# Patient Record
Sex: Female | Born: 1935 | Race: White | Hispanic: No | Smoking: Never smoker
Health system: Southern US, Community
[De-identification: ages and names within clinical notes are randomized; demographics above are authoritative.]

## PROBLEM LIST (undated history)

## (undated) DIAGNOSIS — I2699 Other pulmonary embolism without acute cor pulmonale: Secondary | ICD-10-CM

## (undated) DIAGNOSIS — I350 Nonrheumatic aortic (valve) stenosis: Secondary | ICD-10-CM

## (undated) DIAGNOSIS — I4891 Unspecified atrial fibrillation: Secondary | ICD-10-CM

## (undated) DIAGNOSIS — E7800 Pure hypercholesterolemia, unspecified: Secondary | ICD-10-CM

## (undated) DIAGNOSIS — I10 Essential (primary) hypertension: Secondary | ICD-10-CM

## (undated) DIAGNOSIS — Z85828 Personal history of other malignant neoplasm of skin: Secondary | ICD-10-CM

## (undated) DIAGNOSIS — H409 Unspecified glaucoma: Secondary | ICD-10-CM

## (undated) DIAGNOSIS — I82409 Acute embolism and thrombosis of unspecified deep veins of unspecified lower extremity: Secondary | ICD-10-CM

## (undated) HISTORY — PX: CHOLECYSTECTOMY: SHX55

## (undated) HISTORY — PX: KYPHOPLASTY: SHX5884

## (undated) HISTORY — DX: Unspecified atrial fibrillation: I48.91

## (undated) HISTORY — DX: Unspecified glaucoma: H40.9

## (undated) HISTORY — PX: CATARACT EXTRACTION: SUR2

---

## 1997-06-01 ENCOUNTER — Other Ambulatory Visit: Admission: RE | Admit: 1997-06-01 | Discharge: 1997-06-01 | Payer: Self-pay | Admitting: *Deleted

## 1997-07-01 ENCOUNTER — Ambulatory Visit (HOSPITAL_COMMUNITY): Admission: RE | Admit: 1997-07-01 | Discharge: 1997-07-01 | Payer: Self-pay | Admitting: Family Medicine

## 1998-06-10 ENCOUNTER — Ambulatory Visit (HOSPITAL_COMMUNITY): Admission: RE | Admit: 1998-06-10 | Discharge: 1998-06-10 | Payer: Self-pay | Admitting: Family Medicine

## 1998-06-10 ENCOUNTER — Encounter: Payer: Self-pay | Admitting: Family Medicine

## 1999-08-23 ENCOUNTER — Ambulatory Visit (HOSPITAL_COMMUNITY): Admission: RE | Admit: 1999-08-23 | Discharge: 1999-08-23 | Payer: Self-pay | Admitting: Family Medicine

## 1999-08-23 ENCOUNTER — Encounter: Payer: Self-pay | Admitting: Family Medicine

## 2000-08-13 ENCOUNTER — Other Ambulatory Visit: Admission: RE | Admit: 2000-08-13 | Discharge: 2000-08-13 | Payer: Self-pay | Admitting: Family Medicine

## 2000-12-16 ENCOUNTER — Encounter: Payer: Self-pay | Admitting: Family Medicine

## 2000-12-16 ENCOUNTER — Encounter: Admission: RE | Admit: 2000-12-16 | Discharge: 2000-12-16 | Payer: Self-pay | Admitting: Family Medicine

## 2001-12-17 ENCOUNTER — Ambulatory Visit (HOSPITAL_COMMUNITY): Admission: RE | Admit: 2001-12-17 | Discharge: 2001-12-17 | Payer: Self-pay | Admitting: Family Medicine

## 2001-12-17 ENCOUNTER — Encounter: Payer: Self-pay | Admitting: Family Medicine

## 2002-04-09 ENCOUNTER — Other Ambulatory Visit: Admission: RE | Admit: 2002-04-09 | Discharge: 2002-04-09 | Payer: Self-pay | Admitting: Dermatology

## 2002-06-19 ENCOUNTER — Encounter: Payer: Self-pay | Admitting: Family Medicine

## 2002-06-19 ENCOUNTER — Ambulatory Visit (HOSPITAL_COMMUNITY): Admission: RE | Admit: 2002-06-19 | Discharge: 2002-06-19 | Payer: Self-pay | Admitting: Family Medicine

## 2002-08-21 ENCOUNTER — Ambulatory Visit (HOSPITAL_COMMUNITY): Admission: RE | Admit: 2002-08-21 | Discharge: 2002-08-21 | Payer: Self-pay | Admitting: Family Medicine

## 2002-08-21 ENCOUNTER — Encounter: Payer: Self-pay | Admitting: Family Medicine

## 2002-08-29 ENCOUNTER — Emergency Department (HOSPITAL_COMMUNITY): Admission: EM | Admit: 2002-08-29 | Discharge: 2002-08-29 | Payer: Self-pay | Admitting: *Deleted

## 2002-12-21 ENCOUNTER — Ambulatory Visit (HOSPITAL_COMMUNITY): Admission: RE | Admit: 2002-12-21 | Discharge: 2002-12-21 | Payer: Self-pay | Admitting: Family Medicine

## 2003-04-15 ENCOUNTER — Ambulatory Visit (HOSPITAL_COMMUNITY): Admission: RE | Admit: 2003-04-15 | Discharge: 2003-04-15 | Payer: Self-pay | Admitting: Family Medicine

## 2003-06-14 ENCOUNTER — Other Ambulatory Visit: Admission: RE | Admit: 2003-06-14 | Discharge: 2003-06-14 | Payer: Self-pay | Admitting: Dermatology

## 2003-08-26 ENCOUNTER — Ambulatory Visit (HOSPITAL_COMMUNITY): Admission: RE | Admit: 2003-08-26 | Discharge: 2003-08-26 | Payer: Self-pay | Admitting: Otolaryngology

## 2004-05-17 ENCOUNTER — Encounter: Admission: RE | Admit: 2004-05-17 | Discharge: 2004-08-15 | Payer: Self-pay | Admitting: Family Medicine

## 2004-06-04 ENCOUNTER — Emergency Department (HOSPITAL_COMMUNITY): Admission: EM | Admit: 2004-06-04 | Discharge: 2004-06-05 | Payer: Self-pay | Admitting: Emergency Medicine

## 2004-06-08 ENCOUNTER — Ambulatory Visit: Payer: Self-pay | Admitting: Cardiology

## 2004-06-08 ENCOUNTER — Ambulatory Visit (HOSPITAL_COMMUNITY): Admission: RE | Admit: 2004-06-08 | Discharge: 2004-06-08 | Payer: Self-pay | Admitting: Family Medicine

## 2005-02-17 ENCOUNTER — Emergency Department (HOSPITAL_COMMUNITY): Admission: EM | Admit: 2005-02-17 | Discharge: 2005-02-18 | Payer: Self-pay | Admitting: Emergency Medicine

## 2005-03-12 ENCOUNTER — Ambulatory Visit: Payer: Self-pay | Admitting: Internal Medicine

## 2005-05-11 ENCOUNTER — Ambulatory Visit (HOSPITAL_COMMUNITY): Admission: RE | Admit: 2005-05-11 | Discharge: 2005-05-11 | Payer: Self-pay | Admitting: Family Medicine

## 2005-05-13 ENCOUNTER — Inpatient Hospital Stay (HOSPITAL_COMMUNITY): Admission: EM | Admit: 2005-05-13 | Discharge: 2005-05-16 | Payer: Self-pay | Admitting: Emergency Medicine

## 2005-05-15 ENCOUNTER — Encounter (INDEPENDENT_AMBULATORY_CARE_PROVIDER_SITE_OTHER): Payer: Self-pay | Admitting: *Deleted

## 2005-07-17 ENCOUNTER — Ambulatory Visit: Payer: Self-pay | Admitting: Internal Medicine

## 2005-08-02 ENCOUNTER — Ambulatory Visit (HOSPITAL_COMMUNITY): Admission: RE | Admit: 2005-08-02 | Discharge: 2005-08-02 | Payer: Self-pay | Admitting: Family Medicine

## 2005-08-02 ENCOUNTER — Encounter: Payer: Self-pay | Admitting: Vascular Surgery

## 2006-03-07 ENCOUNTER — Encounter: Admission: RE | Admit: 2006-03-07 | Discharge: 2006-04-04 | Payer: Self-pay | Admitting: Orthopaedic Surgery

## 2006-04-05 ENCOUNTER — Encounter: Admission: RE | Admit: 2006-04-05 | Discharge: 2006-04-18 | Payer: Self-pay | Admitting: Orthopaedic Surgery

## 2006-04-30 ENCOUNTER — Inpatient Hospital Stay (HOSPITAL_COMMUNITY): Admission: EM | Admit: 2006-04-30 | Discharge: 2006-05-03 | Payer: Self-pay | Admitting: Emergency Medicine

## 2006-04-30 ENCOUNTER — Encounter: Payer: Self-pay | Admitting: Vascular Surgery

## 2006-04-30 ENCOUNTER — Ambulatory Visit: Payer: Self-pay | Admitting: Vascular Surgery

## 2006-05-02 ENCOUNTER — Ambulatory Visit: Payer: Self-pay | Admitting: Cardiology

## 2006-05-02 ENCOUNTER — Encounter: Payer: Self-pay | Admitting: Cardiology

## 2006-05-10 ENCOUNTER — Ambulatory Visit: Payer: Self-pay

## 2006-05-20 ENCOUNTER — Ambulatory Visit: Payer: Self-pay | Admitting: Internal Medicine

## 2006-05-30 ENCOUNTER — Encounter (HOSPITAL_COMMUNITY): Admission: RE | Admit: 2006-05-30 | Discharge: 2006-08-19 | Payer: Self-pay | Admitting: Family Medicine

## 2006-06-04 ENCOUNTER — Emergency Department (HOSPITAL_COMMUNITY): Admission: EM | Admit: 2006-06-04 | Discharge: 2006-06-04 | Payer: Self-pay | Admitting: Emergency Medicine

## 2006-11-11 ENCOUNTER — Ambulatory Visit: Payer: Self-pay | Admitting: Internal Medicine

## 2006-11-17 ENCOUNTER — Emergency Department (HOSPITAL_COMMUNITY): Admission: EM | Admit: 2006-11-17 | Discharge: 2006-11-18 | Payer: Self-pay | Admitting: Emergency Medicine

## 2006-11-22 ENCOUNTER — Emergency Department (HOSPITAL_COMMUNITY): Admission: EM | Admit: 2006-11-22 | Discharge: 2006-11-22 | Payer: Self-pay | Admitting: Family Medicine

## 2006-12-05 ENCOUNTER — Emergency Department (HOSPITAL_COMMUNITY): Admission: EM | Admit: 2006-12-05 | Discharge: 2006-12-05 | Payer: Self-pay | Admitting: Emergency Medicine

## 2007-01-28 ENCOUNTER — Ambulatory Visit: Payer: Self-pay | Admitting: Internal Medicine

## 2007-05-30 ENCOUNTER — Ambulatory Visit (HOSPITAL_COMMUNITY): Admission: RE | Admit: 2007-05-30 | Discharge: 2007-05-30 | Payer: Self-pay | Admitting: Family Medicine

## 2007-11-03 ENCOUNTER — Ambulatory Visit: Payer: Self-pay | Admitting: Internal Medicine

## 2008-03-05 ENCOUNTER — Emergency Department (HOSPITAL_COMMUNITY): Admission: EM | Admit: 2008-03-05 | Discharge: 2008-03-06 | Payer: Self-pay | Admitting: Emergency Medicine

## 2008-07-07 ENCOUNTER — Ambulatory Visit (HOSPITAL_COMMUNITY): Admission: RE | Admit: 2008-07-07 | Discharge: 2008-07-07 | Payer: Self-pay

## 2008-08-17 ENCOUNTER — Emergency Department (HOSPITAL_COMMUNITY): Admission: EM | Admit: 2008-08-17 | Discharge: 2008-08-17 | Payer: Self-pay | Admitting: Emergency Medicine

## 2008-08-17 ENCOUNTER — Emergency Department (HOSPITAL_COMMUNITY): Admission: EM | Admit: 2008-08-17 | Discharge: 2008-08-17 | Payer: Self-pay | Admitting: Family Medicine

## 2008-08-24 ENCOUNTER — Emergency Department (HOSPITAL_COMMUNITY): Admission: EM | Admit: 2008-08-24 | Discharge: 2008-08-24 | Payer: Self-pay | Admitting: Emergency Medicine

## 2009-01-30 ENCOUNTER — Emergency Department (HOSPITAL_COMMUNITY): Admission: EM | Admit: 2009-01-30 | Discharge: 2009-01-30 | Payer: Self-pay | Admitting: Emergency Medicine

## 2009-05-10 ENCOUNTER — Emergency Department (HOSPITAL_COMMUNITY): Admission: EM | Admit: 2009-05-10 | Discharge: 2009-05-10 | Payer: Self-pay | Admitting: Emergency Medicine

## 2009-06-06 ENCOUNTER — Emergency Department (HOSPITAL_COMMUNITY): Admission: EM | Admit: 2009-06-06 | Discharge: 2009-06-07 | Payer: Self-pay | Admitting: Emergency Medicine

## 2009-11-23 ENCOUNTER — Emergency Department (HOSPITAL_COMMUNITY): Admission: EM | Admit: 2009-11-23 | Discharge: 2009-11-23 | Payer: Self-pay | Admitting: Emergency Medicine

## 2009-12-20 ENCOUNTER — Ambulatory Visit (HOSPITAL_COMMUNITY)
Admission: RE | Admit: 2009-12-20 | Discharge: 2009-12-20 | Payer: Self-pay | Source: Home / Self Care | Attending: Family Medicine | Admitting: Family Medicine

## 2009-12-22 ENCOUNTER — Ambulatory Visit (HOSPITAL_COMMUNITY)
Admission: RE | Admit: 2009-12-22 | Discharge: 2009-12-22 | Payer: Self-pay | Source: Home / Self Care | Attending: Family Medicine | Admitting: Family Medicine

## 2009-12-27 ENCOUNTER — Ambulatory Visit (HOSPITAL_COMMUNITY)
Admission: RE | Admit: 2009-12-27 | Discharge: 2009-12-27 | Payer: Self-pay | Source: Home / Self Care | Attending: Interventional Radiology | Admitting: Interventional Radiology

## 2010-01-09 ENCOUNTER — Emergency Department (HOSPITAL_COMMUNITY)
Admission: EM | Admit: 2010-01-09 | Discharge: 2010-01-09 | Payer: Self-pay | Source: Home / Self Care | Admitting: Emergency Medicine

## 2010-01-13 ENCOUNTER — Ambulatory Visit (HOSPITAL_COMMUNITY)
Admission: RE | Admit: 2010-01-13 | Discharge: 2010-01-13 | Payer: Self-pay | Source: Home / Self Care | Attending: Interventional Radiology | Admitting: Interventional Radiology

## 2010-01-18 ENCOUNTER — Ambulatory Visit (HOSPITAL_COMMUNITY)
Admission: RE | Admit: 2010-01-18 | Discharge: 2010-01-18 | Payer: Self-pay | Source: Home / Self Care | Attending: Interventional Radiology | Admitting: Interventional Radiology

## 2010-01-19 ENCOUNTER — Ambulatory Visit (HOSPITAL_COMMUNITY)
Admission: RE | Admit: 2010-01-19 | Discharge: 2010-01-19 | Payer: Self-pay | Source: Home / Self Care | Attending: Interventional Radiology | Admitting: Interventional Radiology

## 2010-01-23 LAB — CBC
HCT: 40.6 % (ref 36.0–46.0)
Hemoglobin: 13.7 g/dL (ref 12.0–15.0)
MCH: 29.6 pg (ref 26.0–34.0)
MCHC: 33.7 g/dL (ref 30.0–36.0)
MCV: 87.7 fL (ref 78.0–100.0)
Platelets: 243 10*3/uL (ref 150–400)
RBC: 4.63 MIL/uL (ref 3.87–5.11)
RDW: 14.2 % (ref 11.5–15.5)
WBC: 5.8 10*3/uL (ref 4.0–10.5)

## 2010-01-23 LAB — POCT I-STAT, CHEM 8
BUN: 19 mg/dL (ref 6–23)
Calcium, Ion: 1.18 mmol/L (ref 1.12–1.32)
Chloride: 108 mEq/L (ref 96–112)
Creatinine, Ser: 0.9 mg/dL (ref 0.4–1.2)
Glucose, Bld: 77 mg/dL (ref 70–99)
HCT: 43 % (ref 36.0–46.0)
Hemoglobin: 14.6 g/dL (ref 12.0–15.0)
Potassium: 4 mEq/L (ref 3.5–5.1)
Sodium: 141 mEq/L (ref 135–145)
TCO2: 25 mmol/L (ref 0–100)

## 2010-01-23 LAB — PROTIME-INR
INR: 1.05 (ref 0.00–1.49)
Prothrombin Time: 13.9 seconds (ref 11.6–15.2)

## 2010-01-23 LAB — APTT: aPTT: 27 seconds (ref 24–37)

## 2010-02-03 ENCOUNTER — Ambulatory Visit (HOSPITAL_COMMUNITY)
Admission: RE | Admit: 2010-02-03 | Discharge: 2010-02-03 | Payer: Self-pay | Source: Home / Self Care | Attending: Interventional Radiology | Admitting: Interventional Radiology

## 2010-03-02 ENCOUNTER — Other Ambulatory Visit (HOSPITAL_COMMUNITY): Payer: Self-pay | Admitting: Interventional Radiology

## 2010-03-02 DIAGNOSIS — M549 Dorsalgia, unspecified: Secondary | ICD-10-CM

## 2010-03-10 ENCOUNTER — Ambulatory Visit (HOSPITAL_COMMUNITY)
Admission: RE | Admit: 2010-03-10 | Discharge: 2010-03-10 | Disposition: A | Discharge status: Home / Self Care | Payer: Medicare Other | Source: Ambulatory Visit | Attending: Interventional Radiology | Admitting: Interventional Radiology

## 2010-03-10 DIAGNOSIS — M549 Dorsalgia, unspecified: Secondary | ICD-10-CM | POA: Insufficient documentation

## 2010-03-10 DIAGNOSIS — Z981 Arthrodesis status: Secondary | ICD-10-CM | POA: Insufficient documentation

## 2010-03-20 LAB — POCT I-STAT, CHEM 8
BUN: 13 mg/dL (ref 6–23)
Calcium, Ion: 1.13 mmol/L (ref 1.12–1.32)
Chloride: 103 meq/L (ref 96–112)
Creatinine, Ser: 0.9 mg/dL (ref 0.4–1.2)
Glucose, Bld: 89 mg/dL (ref 70–99)
HCT: 40 % (ref 36.0–46.0)
Hemoglobin: 13.6 g/dL (ref 12.0–15.0)
Potassium: 4.1 meq/L (ref 3.5–5.1)
Sodium: 140 meq/L (ref 135–145)
TCO2: 29 mmol/L (ref 0–100)

## 2010-03-20 LAB — CBC
HCT: 39.6 % (ref 36.0–46.0)
Hemoglobin: 13.2 g/dL (ref 12.0–15.0)
MCH: 29.2 pg (ref 26.0–34.0)
MCHC: 33.3 g/dL (ref 30.0–36.0)
MCV: 87.6 fL (ref 78.0–100.0)
Platelets: 217 K/uL (ref 150–400)
RBC: 4.52 MIL/uL (ref 3.87–5.11)
RDW: 13.4 % (ref 11.5–15.5)
WBC: 4.8 K/uL (ref 4.0–10.5)

## 2010-03-27 LAB — POCT CARDIAC MARKERS
CKMB, poc: 1.1 ng/mL (ref 1.0–8.0)
Myoglobin, poc: 66.4 ng/mL (ref 12–200)
Troponin i, poc: <0.05 ng/mL (ref 0.00–0.09)

## 2010-03-27 LAB — URINALYSIS, ROUTINE W REFLEX MICROSCOPIC
Bilirubin Urine: NEGATIVE
Ketones, ur: NEGATIVE mg/dL
Protein, ur: NEGATIVE mg/dL
Specific Gravity, Urine: 1.015 (ref 1.005–1.030)
Urobilinogen, UA: 0.2 mg/dL (ref 0.0–1.0)

## 2010-03-27 LAB — BASIC METABOLIC PANEL
BUN: 9 mg/dL (ref 6–23)
CO2: 29 mEq/L (ref 19–32)
GFR calc non Af Amer: 54 mL/min — ABNORMAL LOW (ref >60)
Glucose, Bld: 94 mg/dL (ref 70–99)
Sodium: 141 mEq/L (ref 135–145)

## 2010-03-27 LAB — CBC: Hemoglobin: 12.7 g/dL (ref 12.0–15.0)

## 2010-03-27 LAB — URINE MICROSCOPIC-ADD ON

## 2010-03-27 LAB — DIFFERENTIAL
Basophils Relative: 0 % (ref 0–1)
Lymphs Abs: 0.8 10*3/uL (ref 0.7–4.0)
Monocytes Relative: 10 % (ref 3–12)

## 2010-03-28 LAB — DIFFERENTIAL
Basophils Relative: 0 % (ref 0–1)
Lymphocytes Relative: 14 % (ref 12–46)
Monocytes Relative: 19 % — ABNORMAL HIGH (ref 3–12)
Neutro Abs: 2.6 10*3/uL (ref 1.7–7.7)
Neutrophils Relative %: 67 % (ref 43–77)

## 2010-03-28 LAB — CBC
Hemoglobin: 12.1 g/dL (ref 12.0–15.0)
RBC: 3.97 MIL/uL (ref 3.87–5.11)
RDW: 14.4 % (ref 11.5–15.5)
WBC: 3.9 10*3/uL — ABNORMAL LOW (ref 4.0–10.5)

## 2010-03-28 LAB — BASIC METABOLIC PANEL
Calcium: 8.9 mg/dL (ref 8.4–10.5)
GFR calc Af Amer: >60 mL/min (ref >60)
GFR calc non Af Amer: >60 mL/min (ref >60)
Glucose, Bld: 146 mg/dL — ABNORMAL HIGH (ref 70–99)
Potassium: 3.4 mEq/L — ABNORMAL LOW (ref 3.5–5.1)
Sodium: 139 mEq/L (ref 135–145)

## 2010-03-28 LAB — PROTIME-INR: INR: 1.21 (ref 0.00–1.49)

## 2010-04-25 LAB — CBC
HCT: 36 % (ref 36.0–46.0)
Platelets: 179 10*3/uL (ref 150–400)
RBC: 4.19 MIL/uL (ref 3.87–5.11)
WBC: 4.3 10*3/uL (ref 4.0–10.5)

## 2010-04-25 LAB — POCT I-STAT, CHEM 8
BUN: 15 mg/dL (ref 6–23)
Chloride: 106 mEq/L (ref 96–112)
Sodium: 141 mEq/L (ref 135–145)
TCO2: 23 mmol/L (ref 0–100)

## 2010-04-25 LAB — HEPATIC FUNCTION PANEL
ALT: 11 U/L (ref 0–35)
Albumin: 3.7 g/dL (ref 3.5–5.2)
Alkaline Phosphatase: 35 U/L — ABNORMAL LOW (ref 39–117)
Total Protein: 6.1 g/dL (ref 6.0–8.3)

## 2010-04-25 LAB — DIFFERENTIAL
Eosinophils Relative: 1 % (ref 0–5)
Lymphocytes Relative: 21 % (ref 12–46)
Lymphs Abs: 0.9 10*3/uL (ref 0.7–4.0)
Neutrophils Relative %: 66 % (ref 43–77)

## 2010-04-25 LAB — URINALYSIS, ROUTINE W REFLEX MICROSCOPIC
Glucose, UA: NEGATIVE mg/dL
pH: 6 (ref 5.0–8.0)

## 2010-05-02 ENCOUNTER — Emergency Department (HOSPITAL_COMMUNITY)
Admission: EM | Admit: 2010-05-02 | Discharge: 2010-05-02 | Disposition: A | Discharge status: Home / Self Care | Payer: Medicare Other | Attending: Emergency Medicine | Admitting: Emergency Medicine

## 2010-05-02 DIAGNOSIS — Z7901 Long term (current) use of anticoagulants: Secondary | ICD-10-CM | POA: Insufficient documentation

## 2010-05-02 DIAGNOSIS — M810 Age-related osteoporosis without current pathological fracture: Secondary | ICD-10-CM | POA: Insufficient documentation

## 2010-05-02 DIAGNOSIS — H409 Unspecified glaucoma: Secondary | ICD-10-CM | POA: Insufficient documentation

## 2010-05-02 DIAGNOSIS — E7800 Pure hypercholesterolemia, unspecified: Secondary | ICD-10-CM | POA: Insufficient documentation

## 2010-05-02 DIAGNOSIS — Z79899 Other long term (current) drug therapy: Secondary | ICD-10-CM | POA: Insufficient documentation

## 2010-05-02 DIAGNOSIS — Z86718 Personal history of other venous thrombosis and embolism: Secondary | ICD-10-CM | POA: Insufficient documentation

## 2010-05-02 DIAGNOSIS — I10 Essential (primary) hypertension: Secondary | ICD-10-CM | POA: Insufficient documentation

## 2010-05-02 DIAGNOSIS — M79609 Pain in unspecified limb: Secondary | ICD-10-CM | POA: Insufficient documentation

## 2010-05-02 LAB — D-DIMER, QUANTITATIVE: D-Dimer, Quant: 0.22 ug/mL-FEU (ref 0.00–0.48)

## 2010-05-02 LAB — PROTIME-INR: Prothrombin Time: 18.2 seconds — ABNORMAL HIGH (ref 11.6–15.2)

## 2010-05-23 NOTE — Assessment & Plan Note (Signed)
[facility]                            CARDIOLOGY OFFICE NOTE   NAME:Berk, ANAY WALTER                    MRN:          [id number]  DATE:11/03/2007                            DOB:          [birth year]    PRIMARY CARE PHYSICIAN:  Ernestina Penna, MD, in [place].   INTERVAL HISTORY:  Ms. Saadia Dewitt is a delightful 77 year old woman  with a history of chest pain who was subsequently found to have a  pulmonary embolus.  She has a history of mild nonsustained ventricular  tachycardia.  Initial echocardiogram showed an EF of 45-50% with some  inferobasilar hypokinesis, this was treated medically.  Outpatient  Myoview in May 2008 showed an EF of 65% with no wall motion  abnormalities or perfusion defects.  Remainder of her medical history is  notable for hypertension and intermittent snuff use.   She returns today for routine followup.  She is doing great.  She denies  any chest pain or dyspnea.  Blood pressure has been well controlled.  She has not had any neurologic symptoms.  She does have a chronic pain  in her shoulder.   REVIEW OF SYSTEMS:  Negative except as above.   CURRENT MEDICATIONS:  1. Nexium 40 a day.  2. Warfarin.  3. Vitamin D.  4. Lumigan eye drops.  5. Aspirin 81.  6. Simvastatin 40 a day.  7. Neurontin.  8. Lisinopril/hydrochlorothiazide 20/12.5 b.i.d.   PHYSICAL EXAMINATION:  GENERAL:  She is an elderly woman, in no acute  distress, ambulates around the clinic briskly without any respiratory  difficulty.  VITAL SIGNS:  Blood pressure is 132/60, heart rate 66, and  weight is 107.  HEENT:  Normal except for kyphosis.  NECK:  Supple.  No JVD.  Carotids are 2+ bilaterally without bruits.  There is no lymphadenopathy or thyromegaly.  CARDIAC:  PMI is nondisplaced.  Regular rate and rhythm.  A very soft  systolic ejection murmur at the left sternal border.  LUNGS:  Clear.  ABDOMEN:  Soft, nontender, and nondistended.  No  hepatosplenomegaly.  No  bruits, no masses.  Good bowel sounds.  EXTREMITIES:  Warm with no cyanosis, clubbing, or edema.  No rash.  NEUROLOGIC:  Alert and oriented x3.  Cranial nerves II through XII are  intact.  Moves all 4 extremities without difficulty.  Affect is  pleasant.   EKG shows sinus rhythm with PACs, question of small inferior Q-waves.  Unchanged from previous.   ASSESSMENT AND PLAN:  1. Hypertension.  Blood pressure is fairly well controlled.  This is      followed by Dr. Christell Constant.  We would continue current therapy.  2. History of abnormal echocardiogram, although her Myoview study was      negative, I do suspect that she may have some underlying coronary      artery disease, this is asymptomatic.  We will continue risk factor      management.  I do not think there is a need for cardiac      catheterization at this time.  3. History of pulmonary embolus.  She  is on Coumadin and is followed      by Dr. Christell Constant.   DISPOSITION:  We will see her back in 1 year for a yearly followup.     Bevelyn Buckles. Bensimhon, MD  Electronically Signed    DRB/MedQ  DD: 11/03/2007  DT: 11/04/2007  Job #: [id number]

## 2010-05-23 NOTE — Assessment & Plan Note (Signed)
[facility]                            CARDIOLOGY OFFICE NOTE   NAME:Fischman, TASNIA SPEGAL                    MRN:          [id number]  DATE:05/20/2006                            DOB:          19-Jan-1922    PRIMARY CARE PHYSICIAN:  Dr. Vernon Prey.   HISTORY:  Ms. Candace [name] is a delightful 77 year old woman who I recently  saw in the hospital.  She was admitted with chest pain and found to have  a DVT and a pulmonary embolus.  During the admission, she also had some  non-sustained ventricular tachycardia, so we were consulted.  Echocardiogram showed an ejection fraction of 45-50% with some  inferobasilar hypokinesis.  This was treated medically.  She was  referred for an outpatient Myoview, which showed an EF of 65%, with no  wall motion abnormalities or perfusion defects.  She returns today for  routing followup.  She says she is feeling great.  She is doing all of  her activities without any chest pain or shortness of breath.  She has  tried to decrease the amount of snuff she has been chewing.   CURRENT MEDICATIONS:  1. Nexium 40 mg a day.  2. Baby aspirin 81.  3. Warfarin and metoprolol 50 mg once a day.   PHYSICAL EXAMINATION:  GENERAL:  She is an elderly woman, no acute  distress, ambulates around the clinic briskly, without any respiratory  difficulty.  VITAL SIGNS:  Blood pressure is 138/66, heart rate 69, weight is 106.  HEENT:  Normal.  NECK:  Supple.  JVP is about 6-7 cm of water.  Carotids are 2+  bilaterally, no bruits.  There is no lymphadenopathy, thyromegaly.  CARDIAC:  Regular rate and rhythm with occasional ectopy.  A very soft,  systolic ejection murmur at the left sternal border.  LUNGS:  Clear.  ABDOMEN:  Soft, nontender, nondistended.  No hepatosplenomegaly, no  bruits, no masses.  Good bowel sounds.  EXTREMITIES:  Warm with no cyanosis, clubbing or edema.  Distal pulses  are 2+ bilaterally.  SKIN:  There is no rash.  NEUROLOGIC:  She has a very pleasant affect.  Cranial nerves 2-12 are  grossly intact.  Moves all four extremities without difficulty.  Strength is normal.  She is alert and oriented x3.   EKG shows a sinus rhythm with occasional PACs.  Heart rate is 69.  There  is minimal non-specific ST/T wave abnormalities.   ASSESSMENT/PLAN:  1. Chest pain and abnormal echocardiogram.  She did have some evidence      of possible inferior wall hypokinesis on her inpatient      echocardiogram; however, her Myoview looks good with a normal EF      and no perfusion defects.  I suspect she does have some mild      coronary artery disease, but this does not appear high-grade at      this point.  We will just continue our medical therapy.  I would be      quite aggressive with her blood pressure and cholesterol regimen.  2. Hypertension.  We will switch her  metoprolol over to Coreg 6.25      b.i.d. and she will follow with Dr. Christell Constant.  Goal blood pressure      will be less than 130.  3. Hyperlipidemia.  Given her possible coronary artery disease, I      would be quite aggressive with treating her lipids with Statin      therapy.  Suggest an LDL of at least under 100, if not under 70.      She will follow up with Dr. Christell Constant for this.  4. Non-sustained ventricular tachycardia.  This is apparently      resolved.  __________  have just minimal QT prolongation, but does      not appear to be at high risk for sudden cardiac death.  5. Disposition:  Will see her back in the clinic in 6 months for      routine followup.     Bevelyn Buckles. Bensimhon, MD     DRB/MedQ  DD: 05/20/2006  DT: 05/20/2006  Job #: [id number]   cc:   Ernestina Penna, M.D.

## 2010-05-23 NOTE — Assessment & Plan Note (Signed)
[facility]                            CARDIOLOGY OFFICE NOTE   NAME:[name], [name]                    MRN:          [id number]  DATE:01/28/2007                            DOB:          28-Apr-1922    PRIMARY CARE PHYSICIAN:  Dr. Vernon Prey in [place].   INTERVAL HISTORY:  Candace [name] is a delightful 77 year old woman with  history of chest pain who was subsequently found to have a DVT and  pulmonary embolus during the previous admission.  She had some mild  nonsustained ventricular tachycardia.  Echocardiogram showed an EF of 45-  50% with some inferior basilar hypokinesis.  This was treated medically.  She underwent outpatient Myoview back in May 2008 which showed an EF of  65% with no wall motion abnormalities or perfusion defects.  Remainder  of her medical history is notable for hypertension and previous snuff  use.   She returns today for routine follow-up.  She has been doing fairly  well.  She continues to have some chronic shoulder and neck pain which  is unchanged.  This is nonexertional.  She has had extensive orthopedic  workup, and sounds like it is been negative.  She denies any dyspnea.  No lower extremity edema.  She is been taking her blood pressure  medications routinely.   CURRENT MEDICATIONS:  Nexium 40 a day, warfarin, vitamin D, aspirin 81,  lisinopril HCTZ 20/12.5, simvastatin and Neurontin.   PHYSICAL EXAM:  She is no acute distress.  Ambulates around the clinic  without respiratory difficulty.  Blood pressure is 138/78, heart rate is 64.  HEENT is normal.  Neck is supple.  No JVD.  Carotids 2+ bilaterally without bruits.  There  is no lymphadenopathy or thyromegaly.  CARDIAC:  PMI is not displaced.  She is regular rate and rhythm with a  very soft systolic ejection murmur left sternal border.  LUNGS:  Clear.  ABDOMEN:  Soft, nontender, nondistended.  No hepatosplenomegaly, no  bruits, no masses.  Good bowel sounds.  EXTREMITIES:  Warm with cyanosis, clubbing or edema.  Distal pulses are  2+ bilaterally.  There is no rash.  NEURO:  She is alert and oriented x3.  Cranial nerves II-XII grossly  intact.  Moves all four extremities without difficulty.  Affect is  pleasant.   ASSESSMENT/PLAN:  1. Hypertension, blood pressure is still mildly elevated.  Will      increase her lisinopril/hydrochlorothiazide to 40/25.  She will      check a BMET next week in Dr. Kathi Der office.  If her blood      pressure remains elevated, could consider adding Norvasc.  2. Shoulder and neck pain.  The characteristics of this are very      atypical for angina.  She had Myoview which was negative.  I do not      think this is cardiac related.  3. Hyperlipidemia is followed by Dr. Christell Constant.  Continue statin.   DISPOSITION:  Will see her back for routine follow-up in about 9 months.     Bevelyn Buckles. Bensimhon, MD  Electronically Signed  DRB/MedQ  DD: 01/28/2007  DT: 01/28/2007  Job #: [id number]   cc:   Ernestina Penna, M.D.

## 2010-05-23 NOTE — Assessment & Plan Note (Signed)
[facility]                            CARDIOLOGY OFFICE NOTE   NAME:Bufano, SAMARI BITTINGER                    MRN:          [id number]  DATE:11/11/2006                            DOB:          Feb 27, 1922    INTERVAL HISTORY:  Ms. Candace [name] is a delightful, 77 year old woman with  a history of chest pain who was subsequently found to have a DVT and  pulmonary embolus.  During that admission, she had some mild,  nonsustained ventricular tachycardia.  Echocardiogram showed an EF of 45-  50% with some inferobasilar hypokinesis.  This was treated medically.  She underwent an outpatient Myoview which showed an EF of 65% with no  wall motion abnormalities or perfusion defects and says she has been  treated medically.  She also has a history of hypertension.   She is doing well.  She does complain of some chronic shoulder and neck  pain.  This is not exertional and can happen at any time.  She thinks it  is arthritis.  Otherwise, she is not having any problems.  She denies  any shortness of breath, no lower extremity edema, no orthopnea or PND.   CURRENT MEDICATIONS:  1. Nexium 40 a day.  2. Coreg 6.25 mg b.i.d.  3. Lumigan eye drops.   PHYSICAL EXAMINATION:  GENERAL:  She is an elderly woman in no acute  distress.  Ambulates around the clinic briskly without any respiratory  difficulty.  VITAL SIGNS:  Blood pressure initially 160/80, on recheck 180/80.  HEENT:  Normal.  NECK:  Supple.  There is no JVD.  Carotids are 2+ bilaterally without  bruits.  There is no lymphadenopathy or thyromegaly.  CARDIAC:  Nondisplaced.  Regular rate and rhythm with a S4, no murmurs.  LUNGS:  Clear.  ABDOMEN:  Soft, nontender, nondistended.  No hepatosplenomegaly, no  bruits, no masses, good bowel sounds.  There are no renal bruits  appreciated.  EXTREMITIES:  Warm with no clubbing, cyanosis or edema.  Distal pulses  are 2+ bilaterally.  There is no rash.  NEUROLOGIC:  Very  pleasant affect.  Cranial nerves 2-12 grossly intact.  Moves all four extremities without difficulty.  She is alert and  oriented x3.   EKG shows normal sinus rhythm at a rate of 71.  Minimal amount of  specific ST segment abnormality in I and aVL.   ASSESSMENT/PLAN:  1. Abnormal echocardiogram.  Her Myoview is quite reassuring.  She      will continue medical therapy.  There is no evidence of ischemia.  2. Hypertension.  Blood pressure is significantly elevated.  She is      not having any neurologic symptoms.  We will start her on      lisinopril 20/HCTZ 12.5 and will check a potassium in 1 week.  She      will follow up with Dr. Christell Constant at the end of the month and then come      back and see [place] in 2 months.  If her blood pressure is difficult to      control, can consider renal ultrasound to  rule out renal artery      stenosis.     Bevelyn Buckles. Bensimhon, MD  Electronically Signed    DRB/MedQ  DD: 11/11/2006  DT: 11/12/2006  Job #: [id number]   cc:   Ernestina Penna, M.D.  Birdena Jubilee, NP

## 2010-05-26 NOTE — H&P (Signed)
NAME:  Candace [name], Candace [name]             ACCOUNT NO.:  [id number]   MEDICAL RECORD NO.:  [id number]          PATIENT TYPE:  INP   LOCATION:  A313                          FACILITY:  [facility]   PHYSICIAN:  Dirk Dress. Katrinka Blazing, M.D.   DATE OF BIRTH:  07-01-1922   DATE OF ADMISSION:  05/13/2005  DATE OF DISCHARGE:  LH                                HISTORY & PHYSICAL   HISTORY OF THE PRESENT ILLNESS:  This is an 77 year old female who presents  with a four to five-hour history of severe abdominal pain with nausea, but  no vomiting.  The patient states that the pain was acute in onset.  It is  located in the right upper abdomen and radiates through to her back.  She  has nausea, but no vomiting as noted.  She has had similar symptoms before.  She states she had similar symptoms for about eight weeks.  She has been  evaluated and she has a known history of gallstones.  She appears to be  having symptoms of biliary colic.  The patient was seen in the emergency  room and is admitted for treatment.   PAST MEDICAL HISTORY:  The patient has osteoarthritis and glaucoma. This is  her first hospitalization.   PAST SURGICAL HISTORY:  The patient has had no surgery.   REVIEW OF SYSTEMS:  There is no known history of heart or lung disease.   The patient had an echocardiogram done in June 2006.  This was a normal  study with normal right ventricular size and function, and normal regional  wall motion and global left ventricular systolic function.  There was mild  left ventricular hypertrophy.   MEDICATIONS:  1.  Nexium 40 mg daily.  2.  Aspirin 81 mg daily.   ALLERGIES:  THE PATIENT HAS ALLERGIES TO CODEINE, PENICILLIN, SULFA AND  ERYTHROMYCIN.   PHYSICAL EXAMINATION:  GENERAL APPEARANCE:  On exam the patient appears to  be in no acute distress.  VITAL SIGNS: Blood pressure is 146/70, pulse 60, respirations 12 and  temperature 97.7.  Weight 111 pounds.  She is a very small-framed female.  HEENT:  The  head, eyes, ears, nose and throat are unremarkable.  NECK:  The neck is supple with no JVD, bruit, adenopathy, or thyromegaly.  CHEST:  The chest is clear to auscultation.  HEART:  The heart has a regular rate and rhythm without murmur, gallop or  rub.  ABDOMEN:  The abdomen is mildly  distended with mild epigastric and right  upper quadrant tenderness.  Normal active bowel sounds.  No lower abdominal  tenderness. No masses.  EXTREMITIES:  No cyanosis, clubbing or edema.  NEUROLOGIC EXAMINATION:  No focal motor sensory or cerebellar deficit.   ANCILLARY DATA:  Ultrasound of the abdomen, which was done on May 11, 2005  showed multiple small gallstones on the gallbladder, normal common bile  duct, and no free peritoneal fluid.  There was a suggestion of mild right  hydronephrosis.   IMPRESSION:  Cholelithiasis with cholecystitis.   PLAN:  1.  The patient is being admitted.  2.  The  patient will be treated symptomatically.  3.  The patient is being IV antibiotics.  4.  We will schedule a cholecystectomy n May 15, 2005.      Dirk Dress. Katrinka Blazing, M.D.  Electronically Signed     LCS/MEDQ  D:  05/14/2005  T:  05/15/2005  Job:  [id number]

## 2010-05-26 NOTE — Discharge Summary (Signed)
NAME:  RAEGAN, Candace [name]             ACCOUNT NO.:  [id number]   MEDICAL RECORD NO.:  [id number]          PATIENT TYPE:  INP   LOCATION:  A313                          FACILITY:  [facility]   PHYSICIAN:  Dirk Dress. Katrinka Blazing, M.D.   DATE OF BIRTH:  [birth year]   DATE OF ADMISSION:  05/13/2005  DATE OF DISCHARGE:  05/09/2007LH                                 DISCHARGE SUMMARY   DISCHARGE DIAGNOSES:  1.  Cholelithiasis with cholecystitis.  2.  Osteoarthritis.   SPECIAL PROCEDURES:  Laparoscopic cholecystectomy on May 8.   DISPOSITION:  The patient discharged home in stable, satisfactory condition.   DISCHARGE MEDICATIONS:  Tylenol 325 mg 2 tablets every 4 hours as needed for  pain.   FOLLOWUP:  The patient is scheduled to be seen in the office in 2 weeks.   HOSPITAL COURSE:  An 77 year old female with a 5-hour history of severe  abdominal pain with nausea but no vomiting.  The pain was acute in onset and  was in the right upper abdomen and radiated through to her back.  She had  nausea without vomiting.  She had had similar symptoms off and on for about  8 weeks.  She has a known history of gallstones.  She was felt to be having  biliary colic.  She was seen in the emergency room and was admitted.  Her  only other medical problem was osteoarthritis.  She had had an  echocardiogram done in June 2006 which was normal.  Examination revealed a  mildly distended abdomen with epigastric and right upper quadrant  tenderness.  The patient was admitted, started on IV fluids, IV antibiotics  and analgesics.  She felt better the day after admission. She was afebrile.  Abdominal pain was improved.  She was scheduled for cholecystectomy and this  was carried out on May 8 uneventfully.  She had no postoperative problems,  did exceptionally well, and was discharged home on the morning of May 9 in  satisfactory condition without pain, nausea, vomiting, diarrhea with her  wounds healing nicely and was tolerating  a diet.  All of her preoperative  symptoms had resolved.      Dirk Dress. Katrinka Blazing, M.D.  Electronically Signed     LCS/MEDQ  D:  07/08/2005  T:  07/09/2005  Job:  [id number]

## 2010-05-26 NOTE — Discharge Summary (Signed)
NAME:  Candace [name], Candace [name]             ACCOUNT NO.:  [id number]   MEDICAL RECORD NO.:  [id number]          PATIENT TYPE:  INP   LOCATION:  6707                         FACILITY:  [facility]   PHYSICIAN:  Ellie Lunch, M.D.      DATE OF BIRTH:  [birth year]   DATE OF ADMISSION:  04/29/2006  DATE OF DISCHARGE:  05/03/2006                               DISCHARGE SUMMARY   PRIMARY CARE PHYSICIAN:  Dr. Rudi Heap.   DISCHARGE DIAGNOSIS:  1. Left lower extremity deep vein thrombosis and primary embolism.  2. New diagnosis of dyslipidemia with an LDL of 145.  3. Subclinical hypothyroidism with a TSH of 7 and a repeat TSH of 5.1.  4. Nonsustained ventricular tachycardia.  Patient is scheduled for a      Cardiolite on May 10, 2006.  5. Osteoporosis.   DISCHARGE MEDICATIONS:  1. Lovenox shots 50 mg every 12 hours for the next 3 days.  2. Coumadin 7.5 mg p.o. daily.  3. Toprol XL 50 mg p.o. daily.  4. Zocor 40 mg p.o. daily.  5. Aspirin 80 mg p.o. daily.  6. Nexium 40 mg p.o. daily.  7. Os-cal plus D p.o. daily.   DISPOSITION AND FOLLOWUP:  Patient to followup with Laure Kidney on  May 06, 2006 at 9 a.m.  Please check patient's PT/INR on that day to  make sure Coumadin is therapeutic.  Also note patient has been started  on Zocor and thus we will need a lipid panel in about 6 weeks.  Note her  LDL was 145.  Also patient has been given an application for [facility]  Short Stay to get IV Reclast for her osteoporosis and that can be filled  up at the hospital followup visit as well.  Patient will follow up with  Dr. Gala Romney at [facility] at 10:30 a.m. on May 12.  She will  also have an adenosine Cardiolite stress test on the same day at 7:45  a.m.   PROCEDURE DONE:  1. A 2-D echocardiogram was performed on May 02, 2006 to evaluate      nonsustaining ventricular tachycardia which showed diastolic      dysfunction.  Overall systolic function was mildly decreased and EF      was  estimated at 45-50% with left ventricular thickness was mildly      increased as well.  Also there was a small pericardial effusion      posterior to the heart and there was significant hypokinesis of the      inferior wall and the inferior septum.  2. A CT of the chest performed on April 09, 2006 showed a right      pulmonary embolus appearing below a 10 raising the question of fat      embolus as well.   ADMISSION HISTORY AND PHYSICAL:  Candace [name] is a very pleasant 77-  year-old woman with an insignificant past medical history that was  admitted on April 30, 2006 because of a left upper chest pain that was  dull, achy, 8 out of 10 in severity at it worse and was  nonradiating and  nonpleuritic.  She had a CT scan done which showed a right sided  pulmonary embolism and thus was admitted.   HOSPITAL COURSE:  1. Right lower extremity DVT as well as pulmonary embolism.  It is      unclear why patient developed this at her age.  She does not show      any evidence of malignancy.  She has had a colonoscopy.  She      stopped having mammograms but was recommended to probably have one.      Given this new diagnosis of pulmonary embolism she was put on      Lovenox and Coumadin.  Her INR was 1.5 on day of discharge, that      was April 25.  She will continue to get Lovenox over the weekend      and will see her primary care physician on April 28 to assess the      needs for continuation of Lovenox is Coumadin does not become      Therapeutic.  Patient's daughter is going to give Lovenox shots to      her.  2. Nonsustained ventricular tachycardia.  Patient had multiple      episodes of nonsustained ventricular tachycardia.  A 2-D      echocardiogram was obtained with findings as above.  All of her      electrolytes were normal.  Cardiac enzymes were normal.  A      cardiology consult was obtained and Dr. Gala Romney evaluated      patient.  He placed the patient on Toprol XL and will due an       outpatient Cardiolite on May 10, 2006.  3. Dyslipidemia.  A fasting lipid panel was obtained with a total      cholesterol of 202 and LDL of 145.  Patient was started on Zocor 40      p.o. daily and this was again is suppose to be titrated as an      outpatient.  4. Osteoporosis.  An effort was made to give patient IV reclasp while      she was in the hospital.  However, because of reimbursement issues      this is not possible and patient has to get it through short stay.      She has been given an application packet which can be filled out in      the hospital followup visit so that she can get her reclasp while      she comes to get her Cardiolite on May 10, 2006.  5. Subclinical hypothyroidism.  Note patient's TSH was 7.16.  A repeat      TSH was 5.1 and free T4 was normal.  This can further be assessed      as an outpatient if patient were to have symptoms of      hypothyroidism.  6. All other hospital conditions were stable and managed on home meds.      For further details please refer to the discharge summary.      Ellie Lunch, M.D.  Electronically Signed     BP/MEDQ  D:  05/03/2006  T:  05/03/2006  Job:  [id number]   cc:   Ernestina Penna, M.D.  Bevelyn Buckles. Bensimhon, MD  Laure Kidney, NP

## 2010-05-26 NOTE — Procedures (Signed)
NAME:  TIDA, SANER NO.:  [id number]   MEDICAL RECORD NO.:  [id number]          PATIENT TYPE:  OUT   LOCATION:  RAD                           FACILITY:  [facility]   PHYSICIAN:  Wellton Bing, M.D.  DATE OF BIRTH:  [birth year]   DATE OF PROCEDURE:  06/08/2004  DATE OF DISCHARGE:                                  ECHOCARDIOGRAM   REFERRING:  Ernestina Penna, M.D.   CLINICAL DATA:  A 77 year old gentleman with LVH.  M-mode aorta 3.3, left  atrium 4.2, septum 1.4, posterior wall 1.1, LV diastole 3.8, LV systole 2.5.   1.  Technically adequate echocardiographic study.  2.  Mild left atrial enlargement.  Right atrial size of the upper limit of      normal.  Normal right ventricular size and function; mild RVH.  3.  Normal mitral valve; mild annular calcification; minimal regurgitation.  4.  Mild aortic valvular sclerosis; very mild calcification of the proximal      ascending aorta.  5.  Normal tricuspid valve; very mild regurgitation; normal estimated RV      systolic pressure.  6.  Normal pulmonic valve and proximal pulmonary artery.  7.  Normal left ventricular size; mild hypertrophy with disproportionate      involvement of the septum.  Normal regional and global LV systolic function.  1.  Normal IVC.       RR/MEDQ  D:  06/08/2004  T:  06/09/2004  Job:  [id number]

## 2010-05-26 NOTE — Op Note (Signed)
NAME:  MERRELL, RETTINGER             ACCOUNT NO.:  [id number]   MEDICAL RECORD NO.:  [id number]          PATIENT TYPE:  INP   LOCATION:  A313                          FACILITY:  [facility]   PHYSICIAN:  Dirk Dress. Katrinka Blazing, M.D.   DATE OF BIRTH:  02-27-1922   DATE OF PROCEDURE:  05/15/2005  DATE OF DISCHARGE:                                 OPERATIVE REPORT   PREOPERATIVE DIAGNOSIS:  Cholelithiasis, cholecystitis.   POSTOPERATIVE DIAGNOSIS:  Cholelithiasis, cholecystitis.   PROCEDURE:  Laparoscopic cholecystectomy.   SURGEON:  Dirk Dress. Katrinka Blazing, M.D.   DESCRIPTION:  Under general anesthesia, the patient's abdomen was prepped  and draped in a sterile field.  Supraumbilical incision was made.  Veress  needle was inserted uneventfully without difficulty.  Abdomen was  insufflated with 2.5 L of CO2.  Using a Visiport guide, 10-mm port was  placed.  Laparoscope was placed.  Gallbladder was visualized.  Patient  placed in reverse Trendelenburg position.  Under videoscopic guidance, a 10-  mm and two 5-mm ports were placed in the right subcostal region.  Gallbladder was grasped in position.  Cystic duct was dissected, clipped  with five clips in the body.  There were two bites to the cystic artery.  Each was clipped with three clips and divided.  Gallbladder was then  separated from the intrahepatic space using electrocautery.  Gallbladder was  placed in an EndoCatch device and retrieved.  There was no bleeding from the  bed.  There was no evidence of bile leak.  Irrigation was carried out until  fluids returned clear.  The patient tolerated the procedure well.  CO2 was  allowed to escape from the abdomen.  The ports were removed.  The incision  at the umbilicus was closed with 0 Vicryl in the fascia.  All the skin  incisions were closed with staples.  The patient tolerated the procedure  well.  She was awakened from anesthesia uneventfully, transferred to her  bed, and taken to the post-anesthetic care  unit in satisfactory condition.      Dirk Dress. Katrinka Blazing, M.D.  Electronically Signed     LCS/MEDQ  D:  05/15/2005  T:  05/16/2005  Job:  [id number]   cc:   Ernestina Penna, M.D.  Fax: [phone]

## 2010-05-26 NOTE — Consult Note (Signed)
NAME:  Candace [name], Candace [name]             ACCOUNT NO.:  [id number]   MEDICAL RECORD NO.:  [id number]          PATIENT TYPE:  INP   LOCATION:  6707                         FACILITY:  [facility]   PHYSICIAN:  Bevelyn Buckles. Bensimhon, MDDATE OF BIRTH:  March 21, 1922   DATE OF CONSULTATION:  DATE OF DISCHARGE:                                 CONSULTATION   PRIMARY CARE PHYSICIAN:  Ernestina Penna, M.D.   REASON FOR CONSULTATION:  Nonsustained ventricular tachycardia.   HISTORY OF PRESENT ILLNESS:  Candace [name] is a delightful 77 year old  woman with no known history of cardiac disease.  She does have a history  of hypertension, osteoporosis, and skin cancer as well as ongoing snuff  use.  She was admitted on April 29, 2006 with complaint of chest pain.  CAT scan showed a right-sided pulmonary embolus.  She has since been  started on Lovenox and Coumadin.  She has denied any further chest pain.  She does not have any shortness of breath.   While in the hospital, she has been noted to have two to three runs of  brief asymptomatic and nonsustained VT up to 16 beats.  Also on her EKG,  she was noted to have mild QT prolongation.  She denies any history of  syncope or presyncope.  There is no family history of sudden cardiac  death.   The remainder of her review of systems is negative, except for HPI and  problem list.   PROBLEM LIST:  1. Hypertension.  2. Osteoporosis.  3. Glaucoma.  4. Skin cancer, status post resection.  5. Gallstones, status post cholecystectomy.   CURRENT MEDICATIONS:  1. Lovenox and Coumadin.  2. Aspirin 81 a day.  3. Zocor 10 a day.  4. Metoprolol 12.5 b.i.d.   ALLERGIES:  PENICILLIN, SULFA, AND CODEINE.   SOCIAL HISTORY:  She is widowed.  She is very active.  She denies any  history of alcohol or drug use.  There is no cigarettes smoking.  She  does chew snuff which is ongoing.   FAMILY HISTORY:  There is no family history of sudden cardiac death or  premature  coronary artery disease.   PHYSICAL EXAMINATION:  GENERAL:  She is a spry 77 year old sitting up in  bed in no acute distress.  VITAL SIGNS:  Respirations are unlabored.  Blood pressure is 132/69,  heart rate 74.  She is afebrile.  Saturations 98% on room air.  HEENT:  Normal.  NECK:  Supple.  There is no JVD.  Carotids are 2+ bilateral without  bruits.  There is no lymphadenopathy or thyromegaly.  CARDIAC:  She has a regular rate and rhythm.  No murmurs, rubs, or  gallops.  LUNGS:  Clear.  ABDOMEN:  Soft, nontender, nondistended.  There is no  hepatosplenomegaly, no bruits, no masses.  Good bowel sounds.  EXTREMITIES:  Warm with no clubbing, cyanosis, or edema.  There are no  cords, no rashes.  NEUROLOGIC:  Alert and oriented x3.  Cranial nerves II-XII are intact.  She moves all four extremities without difficulty.   Telemetry shows normal sinus rhythm.  Currently  there is one five-beat  run of nonsustained VT earlier in the day which was asymptomatic.   EKG from April 29, 2006 shows normal sinus rhythm at a rate of 79.  There are no acute ST-T wave abnormalities.  Minimally prolonged QT  interval at 438 milliseconds with a QTc interval of 502 milliseconds.  QRS duration is normal at 83 milliseconds.   LABORATORY DATA:  White count 17.6, hemoglobin 13.6, platelets 280.  Sodium 135, potassium 4.4, BUN 13, creatinine 0.78, glucose 99.  Troponins were 0.01 and 0.02.  Total cholesterol 214, HDL 52, LDL 145,  and triglycerides 87.   EKG, as read by Dr. Myrtis Ser, shows an left ventricular ejection fraction of  approximately 45-50% if not slightly better.  There was hyperkinesis of  the inferior and posterior wall and mild LVH.  No significant valvular  disease.  The right side was not well seen but appeared normal.   ASSESSMENT AND PLAN:  1. Asymptomatic and nonsustained ventricular tachycardia.  2. Mildly depressed ejection fraction with inferior and posterior wall      hypokinesis.   3. Acute pulmonary embolus.  4. Minimal QT prolongation.   At this point I think her risk for sudden cardiac death and life  threatening arrhythmias is quite low.  I have discussed this with Dr.  Lewayne Bunting who agrees.  I think she is safe for discharge in the  morning.  We would recommend Toprol-XL 50 mg a day as well as an  outpatient adenosine Myoview which is scheduled for Friday, May 10, 2006  at 8 a.m.  We will also increase her Zocor to 40 mg a day in an effort  to get her LDL down to 70.   We appreciate the consult.  Please do not hesitate to call with  questions.      Bevelyn Buckles. Bensimhon, MD  Electronically Signed     DRB/MEDQ  D:  05/02/2006  T:  05/02/2006  Job:  [id number]

## 2010-05-26 NOTE — H&P (Signed)
NAME:  Candace [name], Candace [name] NO.:  [id number]   MEDICAL RECORD NO.:  [id number]          PATIENT TYPE:  EMS   LOCATION:  MAJO                         FACILITY:  [facility]   PHYSICIAN:  Marcellus Scott, MD     DATE OF BIRTH:  Oct 16, 1922   DATE OF ADMISSION:  04/29/2006  DATE OF DISCHARGE:                              HISTORY & PHYSICAL   PRIMARY CARE PHYSICIAN:  Dr. Rudi Heap of [facility].   CHIEF COMPLAINT:  Chest pain.   HISTORY OF PRESENT ILLNESS:  Candace [name] is a pleasant 77 year old  Caucasian female patient with past medical history of hypertension,  osteoporosis, glaucoma, skin cancer status post excision.  She was in  her usual state of health until yesterday morning when she experienced  left upper chest pain which was dull, aching, 8/10 in severity at its  worst.  It was not radiating, not pleuritic with no associated dyspnea  or diaphoresis.  The patient said the pain gradually subsided and she  did not have any further pain until last night.  She did not seek any  medical attention immediately.  However, this morning, she again  experienced a similar kind of pain following which she was seen by her  primary care doctor.  An EKG was done and the patient was sent to the  emergency room for further evaluation and management.   On further evaluation in the emergency room, a CAT scan of the chest  with contrast has revealed a right-sided pulmonary embolism.  The  patient currently is asymptomatic of chest pain, dyspnea, palpitations.   PAST MEDICAL HISTORY:  1. Questionable hypertension.  2. Osteoporosis.  3. Glaucoma.  4. History of skin cancer on the left cheek which was excised.   PAST SURGICAL HISTORY:  1. Status post cholecystectomy.  2. Status post right intraocular lens placement.   ALLERGIES:  1. PENICILLIN.  2. SULFA.  3. CODEINE.   MEDICATIONS:  1. Nexium 1 tablet p.o. daily.  2. Aspirin 81 mg p.o. daily.  3.  Vitamin D plus calcium.   FAMILY HISTORY:  The patient's sister also with a history of  osteoporosis.   SOCIAL HISTORY:  The patient is widowed.  She is independent of  activities of daily living.  She denies any history of alcohol, drug  abuse or smoking. Abuses snuff.   ADVANCE DIRECTIVES:  The patient is a full code.   REVIEW OF SYSTEMS:  Over 10 systems reviewed  1. The patient with history of recent dental work and some history of      lower lip swelling which is intermittent, persisting and has      difficulty to chew because of that, but no pain.  2. No history of recent travel.   PHYSICAL EXAMINATION:  Candace [name] is a moderately built and nourished  female.  The patient is in no obvious distress.  Vital signs: temperature 97.8 degrees Fahrenheit, blood pressure 157/80,  pulse is 73 per minute, respiration 20 per minute, saturating at 96%.  HEENT: is normocephalic and atraumatic.  Pupils equally reacting to  light and  accommodation.  Missing multiple teeth.  No pharyngeal  erythema.  Neck: is without JVD, carotid bruit, lymphadenopathy or goiter. Supple.  General: No lymphadenopathy.  Respiratory system: is clear to auscultation bilaterally.  Cardiovascular system: has first and second heart sounds heard.  No  third or fourth heart sounds and no murmurs, rubs, gallops or clicks.  Abdomen: is nondistended, nontender.  No organomegaly or mass.  Bowel  sounds are preserved.  Central nervous system: shows the patient is awake, alert, oriented x3  with no focal neurological deficits.  Extremities: reveal no clubbing, cyanosis, or edema.  Peripheral pulses  are symmetrically felt.  Skin: is without any rashes.   LABORATORY DATA:  CBC: hemoglobin of 11.9, hematocrit of 35; d-dimer of  3.17.  Basic metabolic panel with sodium of 604, potassium 3.6, chloride  119, glucose 91, BUN 7, bicarb of 25.8.  Point of care cardiac markers  x2 are negative.   CT of the chest has been  reported as a right pulmonary embolus.  EKG is  normal sinus rhythm at 79 beats per minute with nonspecific ST-T wave  abnormalities and a prolonged QT of 502.   ASSESSMENT/PLAN:  1. Right-sided pulmonary embolism:  We will admit the patient to      telemetry.  We will cycle cardiac enzymes.  We will obtain      bilateral lower extremity venous Dopplers.  We will place the      patient on intravenous heparin and Coumadin per pharmacy for      therapeutic anticoagulation.  2. Anemia:  We will follow the patient's complete blood count and      obtain an anemia panel for the morning.  3. Hypertension:  Systolic blood pressure is slightly up but will      monitor off medications and consider starting an antihypertensive      medication.  4. Prolonged QT:  We will check the patient's basic metabolic panel      and magnesium, and follow up EKG.      Marcellus Scott, MD  Electronically Signed    AH/MEDQ  D:  04/30/2006  T:  04/30/2006  Job:  [id number]   cc:   Ernestina Penna, M.D.

## 2010-10-17 LAB — CBC
HCT: 41.5
Hemoglobin: 14.2
MCHC: 34.2
MCV: 85.8
Platelets: 297
RBC: 4.84
RDW: 13.3
WBC: 6.4

## 2010-10-17 LAB — PROTIME-INR
INR: 2.2 — ABNORMAL HIGH
Prothrombin Time: 25.1 — ABNORMAL HIGH

## 2010-10-17 LAB — DIFFERENTIAL
Basophils Absolute: 0
Basophils Relative: 0
Eosinophils Absolute: 0
Eosinophils Relative: 1
Lymphocytes Relative: 32
Lymphs Abs: 2
Monocytes Absolute: 0.7
Monocytes Relative: 10
Neutro Abs: 3.7
Neutrophils Relative %: 57

## 2010-10-17 LAB — SEDIMENTATION RATE: Sed Rate: 5

## 2010-10-17 LAB — I-STAT 8, (EC8 V) (CONVERTED LAB)
Acid-Base Excess: 2
Bicarbonate: 28.1 — ABNORMAL HIGH
Potassium: 3.8
TCO2: 30
pCO2, Ven: 48.9
pH, Ven: 7.367 — ABNORMAL HIGH

## 2010-11-02 ENCOUNTER — Other Ambulatory Visit (HOSPITAL_COMMUNITY): Payer: Self-pay | Admitting: Interventional Radiology

## 2010-11-02 DIAGNOSIS — IMO0002 Reserved for concepts with insufficient information to code with codable children: Secondary | ICD-10-CM

## 2010-11-08 ENCOUNTER — Ambulatory Visit (HOSPITAL_COMMUNITY)
Admission: RE | Admit: 2010-11-08 | Discharge: 2010-11-08 | Disposition: A | Discharge status: Home / Self Care | Payer: Medicare Other | Source: Ambulatory Visit | Attending: Interventional Radiology | Admitting: Interventional Radiology

## 2010-11-08 ENCOUNTER — Ambulatory Visit (HOSPITAL_COMMUNITY): Admission: RE | Admit: 2010-11-08 | Payer: Medicare Other | Source: Ambulatory Visit

## 2010-11-08 DIAGNOSIS — M51379 Other intervertebral disc degeneration, lumbosacral region without mention of lumbar back pain or lower extremity pain: Secondary | ICD-10-CM | POA: Insufficient documentation

## 2010-11-08 DIAGNOSIS — M5137 Other intervertebral disc degeneration, lumbosacral region: Secondary | ICD-10-CM | POA: Insufficient documentation

## 2010-11-08 DIAGNOSIS — M8448XA Pathological fracture, other site, initial encounter for fracture: Secondary | ICD-10-CM | POA: Insufficient documentation

## 2010-11-08 DIAGNOSIS — IMO0002 Reserved for concepts with insufficient information to code with codable children: Secondary | ICD-10-CM

## 2010-11-08 DIAGNOSIS — K5730 Diverticulosis of large intestine without perforation or abscess without bleeding: Secondary | ICD-10-CM | POA: Insufficient documentation

## 2011-04-23 ENCOUNTER — Other Ambulatory Visit: Payer: Self-pay | Admitting: Family Medicine

## 2011-04-27 ENCOUNTER — Ambulatory Visit (HOSPITAL_COMMUNITY)
Admission: RE | Admit: 2011-04-27 | Discharge: 2011-04-27 | Disposition: A | Discharge status: Home / Self Care | Payer: Medicare Other | Source: Ambulatory Visit | Attending: Family Medicine | Admitting: Family Medicine

## 2011-04-27 DIAGNOSIS — Q619 Cystic kidney disease, unspecified: Secondary | ICD-10-CM | POA: Insufficient documentation

## 2011-04-27 DIAGNOSIS — Z9089 Acquired absence of other organs: Secondary | ICD-10-CM | POA: Insufficient documentation

## 2011-04-27 DIAGNOSIS — R109 Unspecified abdominal pain: Secondary | ICD-10-CM | POA: Insufficient documentation

## 2011-10-06 ENCOUNTER — Encounter (HOSPITAL_COMMUNITY): Payer: Self-pay | Admitting: *Deleted

## 2011-10-06 ENCOUNTER — Emergency Department (HOSPITAL_COMMUNITY): Payer: No Typology Code available for payment source

## 2011-10-06 ENCOUNTER — Emergency Department (HOSPITAL_COMMUNITY)
Admission: EM | Admit: 2011-10-06 | Discharge: 2011-10-06 | Disposition: A | Discharge status: Home / Self Care | Payer: No Typology Code available for payment source | Attending: Emergency Medicine | Admitting: Emergency Medicine

## 2011-10-06 DIAGNOSIS — M546 Pain in thoracic spine: Secondary | ICD-10-CM | POA: Insufficient documentation

## 2011-10-06 DIAGNOSIS — I10 Essential (primary) hypertension: Secondary | ICD-10-CM | POA: Insufficient documentation

## 2011-10-06 DIAGNOSIS — Z86718 Personal history of other venous thrombosis and embolism: Secondary | ICD-10-CM | POA: Insufficient documentation

## 2011-10-06 DIAGNOSIS — E7800 Pure hypercholesterolemia, unspecified: Secondary | ICD-10-CM | POA: Insufficient documentation

## 2011-10-06 DIAGNOSIS — Z9089 Acquired absence of other organs: Secondary | ICD-10-CM | POA: Insufficient documentation

## 2011-10-06 HISTORY — DX: Acute embolism and thrombosis of unspecified deep veins of unspecified lower extremity: I82.409

## 2011-10-06 HISTORY — DX: Other pulmonary embolism without acute cor pulmonale: I26.99

## 2011-10-06 HISTORY — DX: Pure hypercholesterolemia, unspecified: E78.00
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
## 2011-10-08 ENCOUNTER — Other Ambulatory Visit (HOSPITAL_COMMUNITY): Payer: Self-pay | Admitting: Interventional Radiology

## 2011-10-08 ENCOUNTER — Telehealth (HOSPITAL_COMMUNITY): Payer: Self-pay

## 2011-10-08 DIAGNOSIS — M549 Dorsalgia, unspecified: Secondary | ICD-10-CM

## 2011-10-08 DIAGNOSIS — IMO0002 Reserved for concepts with insufficient information to code with codable children: Secondary | ICD-10-CM
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
## 2011-10-10 ENCOUNTER — Other Ambulatory Visit (HOSPITAL_COMMUNITY): Payer: Medicare Other

## 2011-10-23 ENCOUNTER — Encounter (HOSPITAL_COMMUNITY): Payer: Self-pay | Admitting: *Deleted

## 2011-10-23 ENCOUNTER — Emergency Department (HOSPITAL_COMMUNITY)
Admission: EM | Admit: 2011-10-23 | Discharge: 2011-10-23 | Disposition: A | Discharge status: Home / Self Care | Payer: No Typology Code available for payment source | Attending: Emergency Medicine | Admitting: Emergency Medicine

## 2011-10-23 ENCOUNTER — Emergency Department (HOSPITAL_COMMUNITY): Payer: No Typology Code available for payment source

## 2011-10-23 DIAGNOSIS — M546 Pain in thoracic spine: Secondary | ICD-10-CM | POA: Insufficient documentation

## 2011-10-23 DIAGNOSIS — Z79899 Other long term (current) drug therapy: Secondary | ICD-10-CM | POA: Insufficient documentation

## 2011-10-23 DIAGNOSIS — M549 Dorsalgia, unspecified: Secondary | ICD-10-CM | POA: Insufficient documentation

## 2011-10-23 DIAGNOSIS — Z86711 Personal history of pulmonary embolism: Secondary | ICD-10-CM | POA: Insufficient documentation

## 2011-10-23 DIAGNOSIS — Z86718 Personal history of other venous thrombosis and embolism: Secondary | ICD-10-CM | POA: Insufficient documentation

## 2011-10-23 DIAGNOSIS — Z7901 Long term (current) use of anticoagulants: Secondary | ICD-10-CM | POA: Insufficient documentation

## 2011-10-23 DIAGNOSIS — M4000 Postural kyphosis, site unspecified: Secondary | ICD-10-CM | POA: Insufficient documentation

## 2011-10-23 DIAGNOSIS — I10 Essential (primary) hypertension: Secondary | ICD-10-CM | POA: Insufficient documentation

## 2011-10-23 DIAGNOSIS — G8929 Other chronic pain: Secondary | ICD-10-CM | POA: Insufficient documentation

## 2011-10-23 DIAGNOSIS — E7800 Pure hypercholesterolemia, unspecified: Secondary | ICD-10-CM | POA: Insufficient documentation

## 2011-10-23 MED ORDER — TRAMADOL HCL 50 MG PO TABS: ORAL_TABLET | ORAL | Status: DC
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
## 2011-10-23 NOTE — ED Notes (Signed)
States she wants he back xrayed, seen here the end of September for same, still has pain

## 2011-11-10 ENCOUNTER — Emergency Department (HOSPITAL_COMMUNITY)
Admission: EM | Admit: 2011-11-10 | Discharge: 2011-11-10 | Disposition: A | Discharge status: Home / Self Care | Payer: No Typology Code available for payment source | Attending: Emergency Medicine | Admitting: Emergency Medicine

## 2011-11-10 ENCOUNTER — Encounter (HOSPITAL_COMMUNITY): Payer: Self-pay | Admitting: *Deleted

## 2011-11-10 ENCOUNTER — Emergency Department (HOSPITAL_COMMUNITY): Payer: No Typology Code available for payment source

## 2011-11-10 DIAGNOSIS — M4854XA Collapsed vertebra, not elsewhere classified, thoracic region, initial encounter for fracture: Secondary | ICD-10-CM

## 2011-11-10 DIAGNOSIS — I10 Essential (primary) hypertension: Secondary | ICD-10-CM | POA: Insufficient documentation

## 2011-11-10 DIAGNOSIS — Z86711 Personal history of pulmonary embolism: Secondary | ICD-10-CM | POA: Insufficient documentation

## 2011-11-10 DIAGNOSIS — E7800 Pure hypercholesterolemia, unspecified: Secondary | ICD-10-CM | POA: Insufficient documentation

## 2011-11-10 DIAGNOSIS — Z7901 Long term (current) use of anticoagulants: Secondary | ICD-10-CM | POA: Insufficient documentation

## 2011-11-10 DIAGNOSIS — Y939 Activity, unspecified: Secondary | ICD-10-CM | POA: Insufficient documentation

## 2011-11-10 DIAGNOSIS — Y929 Unspecified place or not applicable: Secondary | ICD-10-CM | POA: Insufficient documentation

## 2011-11-10 DIAGNOSIS — X58XXXA Exposure to other specified factors, initial encounter: Secondary | ICD-10-CM | POA: Insufficient documentation

## 2011-11-10 DIAGNOSIS — S22009A Unspecified fracture of unspecified thoracic vertebra, initial encounter for closed fracture: Secondary | ICD-10-CM | POA: Insufficient documentation

## 2011-11-10 DIAGNOSIS — Z79899 Other long term (current) drug therapy: Secondary | ICD-10-CM | POA: Insufficient documentation

## 2011-11-10 DIAGNOSIS — I82409 Acute embolism and thrombosis of unspecified deep veins of unspecified lower extremity: Secondary | ICD-10-CM | POA: Insufficient documentation

## 2011-11-10 MED ORDER — OXYCODONE-ACETAMINOPHEN 5-325 MG PO TABS
1.0000 | ORAL_TABLET | Freq: Once | ORAL | Status: AC
  Administered 2011-11-10: 1 via ORAL
  Filled 2011-11-10: qty 1

## 2011-11-10 MED ORDER — OXYCODONE-ACETAMINOPHEN 5-325 MG PO TABS: 1.0000 | ORAL_TABLET | ORAL | Status: DC | PRN
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
## 2011-11-11 ENCOUNTER — Emergency Department (HOSPITAL_COMMUNITY)
Admission: EM | Admit: 2011-11-11 | Discharge: 2011-11-11 | Disposition: A | Discharge status: Home / Self Care | Payer: Medicare Other | Attending: Emergency Medicine | Admitting: Emergency Medicine

## 2011-11-11 ENCOUNTER — Encounter (HOSPITAL_COMMUNITY): Payer: Self-pay

## 2011-11-11 DIAGNOSIS — Z7901 Long term (current) use of anticoagulants: Secondary | ICD-10-CM | POA: Insufficient documentation

## 2011-11-11 DIAGNOSIS — Z79899 Other long term (current) drug therapy: Secondary | ICD-10-CM | POA: Insufficient documentation

## 2011-11-11 DIAGNOSIS — M549 Dorsalgia, unspecified: Secondary | ICD-10-CM

## 2011-11-11 DIAGNOSIS — Z86711 Personal history of pulmonary embolism: Secondary | ICD-10-CM | POA: Insufficient documentation

## 2011-11-11 DIAGNOSIS — E7800 Pure hypercholesterolemia, unspecified: Secondary | ICD-10-CM | POA: Insufficient documentation

## 2011-11-11 DIAGNOSIS — Z86718 Personal history of other venous thrombosis and embolism: Secondary | ICD-10-CM | POA: Insufficient documentation

## 2011-11-11 DIAGNOSIS — I10 Essential (primary) hypertension: Secondary | ICD-10-CM | POA: Insufficient documentation

## 2011-11-11 MED ORDER — ONDANSETRON 4 MG PO TBDP: 4.0000 mg | ORAL_TABLET | Freq: Three times a day (TID) | ORAL | Status: DC | PRN

## 2011-11-11 NOTE — Discharge Instructions (Signed)
Back Pain: ° ° °Your back pain should be treated with medicines such as ibuprofen or aleve and this back pain should get better over the next 2 weeks.  However if you develop severe or worsening pain, low back pain with fever, numbness, weakness or inability to walk or urinate, you should return to the ER immediately.  Please follow up with your doctor this week for a recheck if still having symptoms. °Low back pain is discomfort in the lower back that may be due to injuries to muscles and ligaments around the spine.  Occasionally, it may be caused by a a problem to a part of the spine called a disc.  The pain may last several days or a week;  However, most patients get completely well in 4 weeks. ° °Self - care:  The application of heat can help soothe the pain.  Maintaining your daily activities, including walking, is encourged, as it will help you get better faster than just staying in bed. ° °Medications are also useful to help with pain control.  A commonly prescribed medications includes acetaminophen.  This medication is generally safe, though you should not take more than 8 of the extra strength (500mg) pills a day. ° °Non steroidal anti inflammatory medications including Ibuprofen and naproxen;  These medications help both pain and swelling and are very useful in treating back pain.  They should be taken with food, as they can cause stomach upset, and more seriously, stomach bleeding.   ° °Muscle relaxants:  These medications can help with muscle tightness that is a cause of lower back pain.  Most of these medications can cause drowsiness, and it is not safe to drive or use dangerous machinery while taking them. ° °You will need to follow up with  Your primary healthcare provider in 1-2 weeks for reassessment. ° °Be aware that if you develop new symptoms, such as a fever, leg weakness, difficulty with or loss of control of your urine or bowels, abdominal pain, or more severe pain, you will need to seek  medical attention and  / or return to the Emergency department. ° °If you do not have a doctor see the list below. ° °RESOURCE GUIDE ° °Chronic Pain Problems: °Contact [facility]  [phone] °Patients need to be referred by their primary care doctor. ° °Insufficient Money for Medicine: °Contact United Way:  call "211" or [facility] 271-5999. ° °No Primary Care Doctor: °- Call [facility]  832-8000 - can help you locate a primary care doctor that  accepts your insurance, provides certain services, etc. °- Physician Referral Service- 1-800-533-3463 ° °Agencies that provide inexpensive medical care: °- [facility]  [phone] °- [facility]  832-7272 °- [facility]  [phone] °- [facility]  832-4777 °- [facility]  [phone] °- [facility]  [phone] ° °Medicaid-accepting [place] Providers: °- [facility]- [address] ° [phone], Mon-Fri 9am-7pm, Sat 9am-1pm °- [facility]- [address], [address] ° [phone] °- [facility]- [address], [address] ° [phone] °- [facility]- [address] [place] [address] ° [phone] °- [facility]- [address], [address], [phone] ° Only accepts [place] Access Medicaid patients after they have their name  applied to their card ° °Self Pay (no insurance) in [place]: °- Sickle Cell Patients: Dr Eric Dean, [facility] °   [address], [phone] °- [facility]- [address] ° [phone] °      -     [facility]- [address] °      -     [facility]- see information above (Speak to Pam H if you do not have insurance) °      -  [facility]- [address], [phone] °      -  [facility] [place]- [address],  [phone] °      -  [facility]- [address] [place] [address],  [phone] °      -  Dr Osei-Bonsu-  [address], [place], [phone] °      -  [facility]- [address], [phone] °      -  [facility]- [address] [place] [address], [phone], also [address], [phone] °      -    [facility]- [address], [phone], 1st & 3rd Saturday   every month, 10am-1pm ° °1) Find a Doctor and Pay Out of Pocket °Although you won't have to find out who is covered by your insurance plan, it is a good idea to ask around and get recommendations. You will then need to call the office and see if the doctor you have chosen will accept you as a new patient and what types of options they offer for patients who are self-pay. Some doctors offer discounts or will set up payment plans for their patients who do not have insurance, but you will need to ask so you aren't surprised when you get to your appointment. ° °2) Contact Your Local Health Department °Not all health departments have doctors that can see patients for sick visits, but many do, so it is worth a call to see if yours does. If you don't know where your local health department is, you can check in your phone book. The CDC also has a tool to help you locate your state's health department, and many state websites also have listings of all of their local health departments. ° °3) Find a Walk-in Clinic °If your illness is not likely to be very severe or complicated, you may want to try a walk in clinic. These are popping up all over the country in pharmacies, drugstores, and shopping centers. They're usually staffed by nurse practitioners or physician assistants that have been trained to treat common illnesses and complaints. They're usually fairly quick and inexpensive. However, if you have serious medical issues or chronic medical problems, these are probably not your best option ° °STD Testing °- [place] [facility] [place], [facility], [address], [place], phone [phone] or 1-877-539-9860.  Monday - Friday, call for an appointment. °- [place] [facility] [place], [facility], [address], [place], phone [phone] or 1-877-539-9860.  Monday - Friday, call for an appointment. ° °Abuse/Neglect: °- [place] Child Abuse Hotline (336) 641-3795 °- [place] Child Abuse Hotline 800-378-5315 (After Hours) ° °Emergency Shelter:   [facility] (336) 271-5985 ° °Maternity Homes: °- [facility] (336) 275-9566 °- [facility] (704) 372-4663 ° °MRSA Hotline #:   832-7006 ° °[place] Resources ° °[facility] [place]  [employer] [place] [facility] °[address]                 [address]         [address]  °[place]                                               [place]                              [place] °Phone:  349-3220                                  Phone:  [phone]                   Phone:  342-8140 ° °[facility], 342-8316 °- [place] [facility] - [facility]- 1-888-581-9988 °      -     [facility] in [place], [address],                                  336-349-4454, Insurance ° °[place] Child Abuse Hotline °(336) 342-1394 or (336) 342-3537 (After Hours) ° ° °Behavioral Health Services ° °Substance Abuse Resources: °- [facility]  336-882-2125 °- [facility] 336-784-9470 °- [facility] 336-285-9073 °- [facility] 336-845-3988 °- Residential & Outpatient Substance Abuse Program  800-659-3381 ° °Psychological Services: °- [facility]  832-9600 °- [facility]  378-7881 °- [facility], [address], ACCESS LINE: 1-800-853-5163 or 336-641-4981, Http://www.[facility]/services/adult.htm ° °Dental Assistance ° °If unable to pay or  uninsured, contact:  [facility] or [place] [facility] to become qualified for the adult dental clinic. ° °Patients with Medicaid: [facility] °[address], [phone] °[address], [phone] ° °If unable to pay, or uninsured, contact [facility] (271-5999) or [place] [facility] (641-3152 in [place], [phone] in [place]) to become qualified for the adult dental clinic ° °Other Low-Cost Community Dental Services: °- [facility]- [address], [place], [place], [address], 723-1848, Ext. [address], 2nd and 4th Thursday of the month at 6:30am.  10 clients each day by appointment, can sometimes see walk-in patients if someone does not show for an appointment. °- [facility]- [address], [place], [place], [address], [phone] °- [facility]- [address], [place], [place], [address], [phone] °- [facility] [facility]- 342-8273 °- [facility]- [phone] °- [facility]- 570-6415 ° ° ° ° ° ° °

## 2011-11-11 NOTE — ED Notes (Signed)
Pt ate a couple packs of crackers and some applesauce. Pt drinking apple juice now.
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
## 2011-11-11 NOTE — ED Notes (Signed)
MD Hyacinth Meeker verified home medication use. Pt took home pain medication.

## 2011-11-11 NOTE — ED Notes (Signed)
Patient allergic to Codeine and was prescribed percocet, patient did not take percocet last night.

## 2011-11-11 NOTE — ED Notes (Signed)
Patient presents with lower back pain since 11-08-11. Patient was seen at [facility] ED last night who found compression fractures to the T-spine.  Patient given percocet without relief of pain.  Patient is requesting further evaluation.

## 2011-11-11 NOTE — ED Provider Notes (Signed)
History     CSN: [id number]  Arrival date & time 11/11/11  4098   First MD Initiated Contact with Patient 11/11/11 0409      Chief Complaint  Patient presents with  . Back Pain    (Consider location/radiation/quality/duration/timing/severity/associated sxs/prior treatment) HPI Comments: Pt presents with ongoing back pain - seen at [facility] earlier in evening and had xrays taht showed that she had prior fractures of the spine - no obvious new fractures, took percocet in ED on empty stomach and vomiting X 2 on way home - has been in pain all night - ambulatory despite this.  No numbn / weakness to legs and no fevers.  She states that her pain started 2 days ago when she was being hugged at church - thinks she was hugged too tightly.  Patient is a 78 y.o. female presenting with back pain. The history is provided by the patient.  Back Pain  Pertinent negatives include no fever, no numbness and no weakness.    Past Medical History  Diagnosis Date  . Hypertension   . DVT (deep venous thrombosis)   . PE (pulmonary embolism)   . Hypercholesterolemia     Past Surgical History  Procedure Date  . Cholecystectomy   . Kyphoplasty     No family history on file.  History  Substance Use Topics  . Smoking status: Never Smoker   . Smokeless tobacco: Current User    Types: Snuff  . Alcohol Use: No    OB History    Grav Para Term Preterm Abortions TAB SAB Ect Mult Living                  Review of Systems  Constitutional: Negative for fever and chills.  HENT: Negative for neck pain.   Cardiovascular: Negative for leg swelling.  Gastrointestinal: Negative for nausea and vomiting.       No incontinence of bowel  Genitourinary: Negative for difficulty urinating.       No incontinence or retention  Musculoskeletal: Positive for back pain.  Skin: Negative for rash.  Neurological: Negative for weakness and numbness.    Allergies  Codeine; Fosamax; and Sulfa  antibiotics  Home Medications   Current Outpatient Rx  Name  Route  Sig  Dispense  Refill  . ACETAMINOPHEN 500 MG PO TABS   Oral   Take 1,000 mg by mouth every 6 (six) hours as needed. For Pain         . BIMATOPROST 0.01 % OP SOLN   Both Eyes   Place 1 drop into both eyes at bedtime.         Marland Kitchen CALCIUM-D 600-400 MG-UNIT PO TABS   Oral   Take 1 tablet by mouth 2 (two) times daily.          Marland Kitchen CARISOPRODOL 250 MG PO TABS   Oral   Take 250 mg by mouth 4 (four) times daily.         . DENOSUMAB 60 MG/ML [place] SOLN   Subcutaneous   Inject 60 mg into the skin every 6 (six) months. Administer in upper arm, thigh, or abdomen         . FUROSEMIDE 20 MG PO TABS   Oral   Take 10 mg by mouth every morning. Takes with a banana         . LISINOPRIL 40 MG PO TABS   Oral   Take 40 mg by mouth every morning.          Marland Kitchen  SIMVASTATIN 40 MG PO TABS   Oral   Take 40 mg by mouth every evening.         . WARFARIN SODIUM 4 MG PO TABS   Oral   Take 2-4 mg by mouth every evening. Take one-half tablet (2mg ) on all days except on Saturdays. On Saturdays take one tablet (4mg )         . ONDANSETRON 4 MG PO TBDP   Oral   Take 1 tablet (4 mg total) by mouth every 8 (eight) hours as needed for nausea.   10 tablet   0     BP 156/102  Pulse 119  Temp 98.2 F (36.8 C) (Oral)  Resp 14  SpO2 95%  Physical Exam  Nursing note and vitals reviewed. Constitutional: She appears well-developed and well-nourished. No distress.  HENT:  Head: Normocephalic and atraumatic.  Mouth/Throat: Oropharynx is clear and moist. No oropharyngeal exudate.  Eyes: Conjunctivae normal and EOM are normal. Pupils are equal, round, and reactive to light. Right eye exhibits no discharge. Left eye exhibits no discharge. No scleral icterus.  Neck: Normal range of motion. Neck supple. No JVD present. No thyromegaly present.  Cardiovascular: Normal rate, regular rhythm, normal heart sounds and intact distal  pulses.  Exam reveals no gallop and no friction rub.   No murmur heard.      Mild tachycardia to 105  Pulmonary/Chest: Effort normal and breath sounds normal. No respiratory distress. She has no wheezes. She has no rales.  Abdominal: Soft. Bowel sounds are normal. She exhibits no distension and no mass. There is no tenderness.  Musculoskeletal: Normal range of motion. She exhibits tenderness ( mild ttp to mid back, kyphosis.  no spinal ttp). She exhibits no edema.  Lymphadenopathy:    She has no cervical adenopathy.  Neurological: She is alert. Coordination normal.  Skin: Skin is warm and dry. No rash noted. No erythema.  Psychiatric: She has a normal mood and affect. Her behavior is normal.    ED Course  Procedures (including critical care time)  Labs Reviewed - No data to display Dg Thoracic Spine 2 View  11/10/2011  *RADIOLOGY REPORT*  Clinical Data:  Back pain.  THORACIC SPINE - 2 VIEW  Comparison: 10/23/2011  Findings: Moderate osteopenia.  Previous vertebral augmentation of T9-T11.  Accentuation of normal thoracic kyphosis.  Moderate to severe compression deformity at T7 is stable from previous exam. Similar mild compression deformity involving the T12 vertebra.  The T8 vertebra is minimally decreased in height, stable.  No new compression fractures.  AP view again demonstrates cardiac enlargement.  IMPRESSION:  1.  No significant change from 10/23/2011. 2.  Vertebral augmentation at T9-T11 with similar compression deformities at T7, T8 and T12.   Original Report Authenticated By: Signa Kell, M.D.    Dg Lumbar Spine Complete  11/10/2011  *RADIOLOGY REPORT*  Clinical Data: Prior kyphoplasty, back pain  LUMBAR SPINE - COMPLETE 4+ VIEW  Comparison: 10/23/2011  Findings: Normal alignment.  Lumbar vertebral body heights are preserved.  Minor degenerative changes.  Vertebral augmentation changes at T9, T10, T11.  Stable mild compression fracture at T12. Extensive atherosclerosis of the aorta.   No pars defects.  Bones appear osteopenic.  Normal SI joints.  Prior cholecystectomy noted.  IMPRESSION: Stable lumbar spine exam compared to the prior study.   Original Report Authenticated By: Judie Petit. Shick, M.D.      1. Back pain       MDM  Imaging reviewed showing prior kyphoplasty  type repairs, pt has no neuro defecits.  She wants to have something to eat and attempt to use pain medicine again.     Pt has been given food - tolerated - has taken her oxycodone that she had from earlier in the evening and states that she feels much better - no indication for further investigation at this time.   zofran   Vida Roller, MD 11/11/11 [phone]

## 2011-11-14 ENCOUNTER — Other Ambulatory Visit (HOSPITAL_COMMUNITY): Payer: Self-pay | Admitting: Interventional Radiology

## 2011-11-14 DIAGNOSIS — M549 Dorsalgia, unspecified: Secondary | ICD-10-CM

## 2012-03-13 ENCOUNTER — Encounter (INDEPENDENT_AMBULATORY_CARE_PROVIDER_SITE_OTHER): Payer: Medicare Other | Admitting: Ophthalmology

## 2012-03-28 ENCOUNTER — Other Ambulatory Visit: Payer: Self-pay | Admitting: *Deleted

## 2012-03-28 DIAGNOSIS — I10 Essential (primary) hypertension: Secondary | ICD-10-CM

## 2012-03-28 MED ORDER — LISINOPRIL 40 MG PO TABS: 40.0000 mg | ORAL_TABLET | Freq: Every morning | ORAL | Status: DC

## 2012-04-02 ENCOUNTER — Ambulatory Visit (INDEPENDENT_AMBULATORY_CARE_PROVIDER_SITE_OTHER): Payer: Medicare Other | Admitting: Physician Assistant

## 2012-04-02 VITALS — BP 168/90 | HR 102 | Temp 97.5°F | Ht <= 58 in | Wt 94.0 lb

## 2012-04-02 DIAGNOSIS — I10 Essential (primary) hypertension: Secondary | ICD-10-CM

## 2012-04-02 MED ORDER — METOPROLOL TARTRATE 25 MG PO TABS: 25.0000 mg | ORAL_TABLET | Freq: Two times a day (BID) | ORAL | Status: DC

## 2012-04-02 NOTE — Progress Notes (Signed)
  Subjective:    Patient ID: Candace [name], female    DOB: May 10, 1922, 79 y.o.   MRN: [id number]  HPI Blood pressure has been up over the past week; started a new eye drop, can't remember the name of it, but thinks it may be the cause of her change in B/P    Review of Systems  Cardiovascular: Negative for chest pain, palpitations and leg swelling.  All other systems reviewed and are negative.       Objective:   Physical Exam  Vitals reviewed. Constitutional: She is oriented to person, place, and time. She appears well-developed and well-nourished.  HENT:  Head: Normocephalic and atraumatic.  Right Ear: External ear normal.  Left Ear: External ear normal.  Nose: Nose normal.  Mouth/Throat: Oropharynx is clear and moist.  Eyes: Conjunctivae and EOM are normal.  Neck: Normal range of motion. Neck supple. JVD present.  Cardiovascular: Regular rhythm and normal heart sounds.   No murmur heard. Tachycardia; HR 102 bpm  Pulmonary/Chest: Effort normal and breath sounds normal.  Neurological: She is alert and oriented to person, place, and time.  Skin: Skin is warm and dry.  Psychiatric: She has a normal mood and affect. Her behavior is normal. Judgment and thought content normal.          Assessment & Plan:  HTN (hypertension) - Plan: metoprolol tartrate (LOPRESSOR) 25 MG tablet  Recheck B/P and heart rate next week

## 2012-04-09 ENCOUNTER — Ambulatory Visit (INDEPENDENT_AMBULATORY_CARE_PROVIDER_SITE_OTHER): Payer: Medicare Other | Admitting: Physician Assistant

## 2012-04-09 ENCOUNTER — Encounter: Payer: Self-pay | Admitting: Physician Assistant

## 2012-04-09 VITALS — BP 186/99 | HR 127 | Temp 97.7°F | Ht <= 58 in | Wt 88.6 lb

## 2012-04-09 DIAGNOSIS — I10 Essential (primary) hypertension: Secondary | ICD-10-CM

## 2012-04-09 MED ORDER — METOPROLOL TARTRATE 25 MG PO TABS: 50.0000 mg | ORAL_TABLET | Freq: Two times a day (BID) | ORAL | Status: DC

## 2012-04-10 ENCOUNTER — Other Ambulatory Visit: Payer: Self-pay | Admitting: Physician Assistant

## 2012-04-10 NOTE — Progress Notes (Signed)
  Subjective:    Patient ID: Candace [name], female    DOB: 03/14/1922, 79 y.o.   MRN: [id number]  HPI Recheck on hypertension and cardiac issues; last visit to cardiologist was 4 years ago; started metoprolol as directed   Review of Systems  All other systems reviewed and are negative.       Objective:   Physical Exam  Vitals reviewed. Constitutional: She appears well-developed and well-nourished.  Cardiovascular:  BP higher than previous visit HR faster than previous visit Bounding S2          Assessment & Plan:  HTN (hypertension) - Plan: Ambulatory referral to Cardiology, metoprolol tartrate (LOPRESSOR) 25 MG tablet, DISCONTINUED: metoprolol tartrate (LOPRESSOR) 25 MG tablet

## 2012-04-15 ENCOUNTER — Ambulatory Visit (INDEPENDENT_AMBULATORY_CARE_PROVIDER_SITE_OTHER): Payer: Medicare Other | Admitting: Pharmacist Clinician (PhC)/ Clinical Pharmacy Specialist

## 2012-04-15 DIAGNOSIS — I4891 Unspecified atrial fibrillation: Secondary | ICD-10-CM | POA: Insufficient documentation

## 2012-04-15 LAB — POCT INR: INR: 1.4

## 2012-04-16 ENCOUNTER — Ambulatory Visit (INDEPENDENT_AMBULATORY_CARE_PROVIDER_SITE_OTHER): Payer: Medicare Other | Admitting: *Deleted

## 2012-04-16 ENCOUNTER — Encounter: Payer: Self-pay | Admitting: Cardiology

## 2012-04-16 ENCOUNTER — Ambulatory Visit (INDEPENDENT_AMBULATORY_CARE_PROVIDER_SITE_OTHER): Payer: Medicare Other | Admitting: Cardiology

## 2012-04-16 VITALS — BP 140/97 | HR 98 | Ht 61.0 in | Wt 87.0 lb

## 2012-04-16 DIAGNOSIS — I4891 Unspecified atrial fibrillation: Secondary | ICD-10-CM

## 2012-04-16 NOTE — Progress Notes (Signed)
HPI The patient presents for evaluation of atrial fibrillation. She was seen in the past by Dr. Gala Romney for management of hypertension. I do note that she had a slightly reduced ejection fraction but she's never had symptoms consistent with heart failure. Recently she was with her daughter who was having a doctor's appointment and she complained of a "spell". She thought her heart rate was increased. Her blood pressure was checked and it was elevated.  She has been noted to be in atrial fibrillation which apparently is new. She was started on a low-dose beta blocker for management of both issues. She lives by herself. She does some light household chores. With this she's not really bringing on any tachycardia palpitations, presyncope or syncope. She doesn't get any significant shortness of breath, PND or orthopnea. She's had no weight gain or edema. Of note she's been on chronic anticoagulation for years because of a history of DVT.  Allergies  Allergen Reactions  . Codeine Hives and Shortness Of Breath  . Boniva (Ibandronic Acid)   . Fosamax (Alendronate Sodium) Hives and Nausea And Vomiting  . Septra (Sulfamethoxazole-Tmp Ds)   . Tramadol   . Vioxx (Rofecoxib)   . Sulfa Antibiotics Nausea And Vomiting    Current Outpatient Prescriptions  Medication Sig Dispense Refill  . bimatoprost (LUMIGAN) 0.01 % SOLN Place 1 drop into both eyes at bedtime.      . brimonidine (ALPHAGAN P) 0.1 % SOLN       . Calcium Carbonate-Vitamin D (CALCIUM-D) 600-400 MG-UNIT TABS Take 1 tablet by mouth 2 (two) times daily.       Marland Kitchen denosumab (PROLIA) 60 MG/ML SOLN injection Inject 60 mg into the skin every 6 (six) months. Administer in upper arm, thigh, or abdomen      . metoprolol tartrate (LOPRESSOR) 25 MG tablet Take 2 tablets (50 mg total) by mouth 2 (two) times daily.  60 tablet  3  . warfarin (COUMADIN) 4 MG tablet Take 2-4 mg by mouth every evening. Take one-half tablet (2mg ) on all days except on Saturdays.  On Saturdays take one tablet (4mg )       No current facility-administered medications for this visit.    Past Medical History  Diagnosis Date  . Hypertension   . DVT (deep venous thrombosis)   . PE (pulmonary embolism)   . Hypercholesterolemia   . Atrial fibrillation     Past Surgical History  Procedure Laterality Date  . Cholecystectomy    . Kyphoplasty      Family History  Problem Relation Age of Onset  . Stroke Father     History   Social History  . Marital Status: Widowed    Spouse Name: N/A    Number of Children: 4  . Years of Education: N/A   Occupational History  .     Social History Main Topics  . Smoking status: Never Smoker   . Smokeless tobacco: Current User    Types: Snuff  . Alcohol Use: No  . Drug Use: No  . Sexually Active: Not on file   Other Topics Concern  . Not on file   Social History Narrative   Lives alone.      ROS:  As stated in the HPI and negative for all other systems.   PHYSICAL EXAM BP 140/97  Pulse 98  Ht 5\' 1"  (1.549 m)  Wt 87 lb (39.463 kg)  BMI 16.45 kg/m2 GENERAL:  Well appearing, frail HEENT:  Pupils equal round and  reactive, fundi not visualized, oral mucosa unremarkable NECK:  No jugular venous distention, waveform within normal limits, carotid upstroke brisk and symmetric, no bruits, no thyromegaly LYMPHATICS:  No cervical, inguinal adenopathy LUNGS:  Clear to auscultation bilaterally BACK:  Lordosis, no CVA tenderness, lordosis CHEST:  Unremarkable HEART:  PMI not displaced or sustained,S1 and S2 within normal limits, no S3,no clicks, no rubs, apical systolic and early peaking and heard slightly out the aortic outflow tract, no diastolic murmurs,irregular  ABD:  Flat, positive bowel sounds normal in frequency in pitch, no bruits, no rebound, no guarding, no midline pulsatile mass, no hepatomegaly, no splenomegaly EXT:  2 plus pulses throughout, no edema, no cyanosis no clubbing SKIN:  No rashes no  nodules NEURO:  Cranial nerves II through XII grossly intact, motor grossly intact throughout PSYCH:  Cognitively intact, oriented to person place and time   EKG:  Atrial fibrillation, rate 98, axis within normal limits, intervals within normal limits, no acute ST-T wave changes. 04/16/2012   ASSESSMENT AND PLAN  ATRIAL FIBRILLATION:  The patient has atrial fibrillation. I am not clear on the duration of this. She is not feeling tachypalpitations in particular except as above. I will however place a 24-hour Holter to make sure she has good rate control. She's already on anticoagulation and I would see no reason to change this.  HTN:  She was recently started on a beta blocker which is a good choice given the fibrillation. Further evaluation of her blood pressure will be per her primary physician.  CARDIOMYOPATHY:  I reviewed previous echo from 2008 when her EF was slightly reduced. However, since she's not having any symptoms and seems to be euvolemic I don't think any further testing is indicated.

## 2012-04-16 NOTE — Progress Notes (Signed)
Holter monitor placed and instructions were given to patient. She will return in 24 hrs.

## 2012-04-16 NOTE — Patient Instructions (Addendum)
The current medical regimen is effective;  continue present plan and medications.  Your physician has recommended that you wear a holter monitor. Holter monitors are medical devices that record the heart's electrical activity. Doctors most often use these monitors to diagnose arrhythmias. Arrhythmias are problems with the speed or rhythm of the heartbeat. The monitor is a small, portable device. You can wear one while you do your normal daily activities. This is usually used to diagnose what is causing palpitations/syncope (passing out).  Follow up with Dr Antoine Poche in 2 months.

## 2012-04-18 NOTE — Progress Notes (Signed)
Results faxed to Dr. Antoine Poche.  Confirmed with their office staff that they received the report.

## 2012-04-18 NOTE — Progress Notes (Signed)
Results downloaded and will be faxed to Dr. Antoine Poche and then scanned into EMR.

## 2012-04-18 NOTE — Progress Notes (Signed)
Pt returned monitor on 04/17/12.  Download started.

## 2012-04-21 NOTE — Progress Notes (Signed)
Results scanned into epic and results routed to Dr.Hochrein. Patient notified that Dr. Jenene Slicker office should notify her of the results.

## 2012-04-24 ENCOUNTER — Ambulatory Visit (INDEPENDENT_AMBULATORY_CARE_PROVIDER_SITE_OTHER): Payer: Medicare Other | Admitting: Pharmacist

## 2012-04-24 DIAGNOSIS — I4891 Unspecified atrial fibrillation: Secondary | ICD-10-CM

## 2012-04-24 NOTE — Patient Instructions (Signed)
Anticoagulation Dose Instructions as of 04/24/2012     Candace [name] Tue Wed Thu Fri Sat   New Dose 2 mg 4 mg 2 mg 4 mg 2 mg 4 mg 2 mg    Description       Increase warfarin dose to 4mg  Monday, Wednesdays and Fridays.  2mg  [= 1/2 tablet ] all other days     INR was 1.8 today

## 2012-04-25 ENCOUNTER — Telehealth: Payer: Self-pay | Admitting: Cardiology

## 2012-04-25 NOTE — Telephone Encounter (Signed)
Follow Up    Pt calling in returning phone call regarding results from her monitor. Please call

## 2012-04-25 NOTE — Telephone Encounter (Signed)
Pt given Holter monitor results, she verbalized understanding.

## 2012-05-01 ENCOUNTER — Telehealth: Payer: Self-pay

## 2012-05-01 ENCOUNTER — Telehealth: Payer: Self-pay | Admitting: Cardiology

## 2012-05-01 NOTE — Telephone Encounter (Signed)
Follow Up ° ° ° ° ° °Pt calling in returning phone call from earlier. Please call. °

## 2012-05-01 NOTE — Telephone Encounter (Signed)
lmtco

## 2012-05-07 ENCOUNTER — Ambulatory Visit: Payer: Medicare Other | Admitting: Cardiology

## 2012-05-13 ENCOUNTER — Encounter: Payer: Self-pay | Admitting: Family Medicine

## 2012-05-13 ENCOUNTER — Ambulatory Visit (INDEPENDENT_AMBULATORY_CARE_PROVIDER_SITE_OTHER): Payer: Medicare Other | Admitting: Family Medicine

## 2012-05-13 VITALS — BP 130/89 | HR 52 | Temp 97.1°F | Wt 88.6 lb

## 2012-05-13 DIAGNOSIS — Z7901 Long term (current) use of anticoagulants: Secondary | ICD-10-CM | POA: Insufficient documentation

## 2012-05-13 DIAGNOSIS — I4891 Unspecified atrial fibrillation: Secondary | ICD-10-CM

## 2012-05-13 DIAGNOSIS — I82401 Acute embolism and thrombosis of unspecified deep veins of right lower extremity: Secondary | ICD-10-CM | POA: Insufficient documentation

## 2012-05-13 DIAGNOSIS — I10 Essential (primary) hypertension: Secondary | ICD-10-CM | POA: Insufficient documentation

## 2012-05-13 DIAGNOSIS — I82409 Acute embolism and thrombosis of unspecified deep veins of unspecified lower extremity: Secondary | ICD-10-CM

## 2012-05-13 LAB — POCT INR: INR: 1.9

## 2012-05-13 NOTE — Progress Notes (Signed)
Patient ID: Candace [name], female   DOB: Jun 13, 1922, 79 y.o.   MRN: [id number] SUBJECTIVE: HPI: Patient with A Fib and HTN comes in for follow up. Had PT today. Feels great. No complaints.  Past Medical History  Diagnosis Date  . Hypertension   . DVT (deep venous thrombosis)   . PE (pulmonary embolism)   . Hypercholesterolemia   . Atrial fibrillation    Past Surgical History  Procedure Laterality Date  . Cholecystectomy    . Kyphoplasty     History   Social History  . Marital Status: Widowed    Spouse Name: N/A    Number of Children: 4  . Years of Education: N/A   Occupational History  .     Social History Main Topics  . Smoking status: Never Smoker   . Smokeless tobacco: Current User    Types: Snuff  . Alcohol Use: No  . Drug Use: No  . Sexually Active: Not on file   Other Topics Concern  . Not on file   Social History Narrative   Lives alone.     Family History  Problem Relation Age of Onset  . Stroke Father    Current Outpatient Prescriptions on File Prior to Visit  Medication Sig Dispense Refill  . bimatoprost (LUMIGAN) 0.01 % SOLN Place 1 drop into both eyes at bedtime.      . brimonidine (ALPHAGAN P) 0.1 % SOLN       . Calcium Carbonate-Vitamin D (CALCIUM-D) 600-400 MG-UNIT TABS Take 1 tablet by mouth 2 (two) times daily.       Marland Kitchen denosumab (PROLIA) 60 MG/ML SOLN injection Inject 60 mg into the skin every 6 (six) months. Administer in upper arm, thigh, or abdomen      . metoprolol tartrate (LOPRESSOR) 25 MG tablet Take 25 mg by mouth 2 (two) times daily.      Marland Kitchen warfarin (COUMADIN) 4 MG tablet Take 2-4 mg by mouth every evening. Take one-half tablet (2mg ) on all days except on Saturdays. On Saturdays take one tablet (4mg )       No current facility-administered medications on file prior to visit.   Allergies  Allergen Reactions  . Codeine Hives and Shortness Of Breath  . Boniva (Ibandronic Acid)   . Fosamax (Alendronate Sodium) Hives and Nausea And  Vomiting  . Septra (Sulfamethoxazole-Tmp Ds)   . Tramadol   . Vioxx (Rofecoxib)   . Sulfa Antibiotics Nausea And Vomiting    There is no immunization history on file for this patient. Prior to Admission medications   Medication Sig Start Date End Date Taking? Authorizing Provider  bimatoprost (LUMIGAN) 0.01 % SOLN Place 1 drop into both eyes at bedtime.   Yes Historical Provider, MD  brimonidine (ALPHAGAN P) 0.1 % SOLN    Yes Historical Provider, MD  Calcium Carbonate-Vitamin D (CALCIUM-D) 600-400 MG-UNIT TABS Take 1 tablet by mouth 2 (two) times daily.    Yes Historical Provider, MD  denosumab (PROLIA) 60 MG/ML SOLN injection Inject 60 mg into the skin every 6 (six) months. Administer in upper arm, thigh, or abdomen   Yes Historical Provider, MD  metoprolol tartrate (LOPRESSOR) 25 MG tablet Take 25 mg by mouth 2 (two) times daily. 04/09/12  Yes Horald Pollen, PA-C  warfarin (COUMADIN) 4 MG tablet Take 2-4 mg by mouth every evening. Take one-half tablet (2mg ) on all days except on Saturdays. On Saturdays take one tablet (4mg )   Yes Historical Provider, MD    ROS:  As above in the HPI. All other systems are stable or negative.  OBJECTIVE: APPEARANCE:  Patient in no acute distress.The patient appeared well nourished and normally developed. Acyanotic. Waist: energetic active elderly WF VITAL SIGNS:BP 130/89  Pulse 52  Temp(Src) 97.1 F (36.2 C) (Oral)  Wt 88 lb 9.6 oz (40.189 kg)  BMI 16.75 kg/m2   SKIN: warm and  Dry  HEAD and Neck: without JVD, Head and scalp: normal Eyes:No scleral icterus. Fundi normal, eye movements normal. Ears: Auricle normal, canal normal, Tympanic membranes normal, insufflation normal. Nose: normal Throat: normal Neck & thyroid: normal  CHEST & LUNGS: Chest wall: normal Lungs: Clear  CVS: Reveals the PMI to be normally located. Regular rhythm, First and Second Heart sounds are normal,    ABDOMEN:  Appearance: normal Benign,, no organomegaly,  no masses, no Abdominal Aortic enlargement. No Guarding , no rebound. No Bruits. Bowel sounds: normal  EXTREMETIES: nonedematous.  MUSCULOSKELETAL:  Spine: kyphosis Joints: intact  NEUROLOGIC: oriented to time,place and person; nonfocal.  ASSESSMENT: DVT (deep venous thrombosis), right - Plan: Ambulatory referral to Anticoagulation Monitoring, POCT INR, POCT INR, Ambulatory referral to Anticoagulation Monitoring  Atrial fibrillation  HTN (hypertension)  Stable. The INR is  Still subtherapeutic.  PLAN: increase the coumadin to 1 tabs Mondays, Wednesday and Friday and  Saturday. 1/2 tab other days. No orders of the defined types were placed in this encounter.   RTc in 2 weeks in the coumadin clinic.  Kashis Penley P. Modesto Charon, M.D.

## 2012-05-27 ENCOUNTER — Ambulatory Visit (INDEPENDENT_AMBULATORY_CARE_PROVIDER_SITE_OTHER): Payer: Medicare Other | Admitting: Pharmacist Clinician (PhC)/ Clinical Pharmacy Specialist

## 2012-05-27 DIAGNOSIS — I4891 Unspecified atrial fibrillation: Secondary | ICD-10-CM

## 2012-06-09 ENCOUNTER — Other Ambulatory Visit: Payer: Self-pay

## 2012-06-09 MED ORDER — WARFARIN SODIUM 4 MG PO TABS: ORAL_TABLET | ORAL | Status: DC

## 2012-06-18 ENCOUNTER — Ambulatory Visit (INDEPENDENT_AMBULATORY_CARE_PROVIDER_SITE_OTHER): Payer: Medicare Other | Admitting: Cardiology

## 2012-06-18 ENCOUNTER — Encounter: Payer: Self-pay | Admitting: Cardiology

## 2012-06-18 VITALS — BP 140/100 | HR 64 | Wt 92.6 lb

## 2012-06-18 DIAGNOSIS — I10 Essential (primary) hypertension: Secondary | ICD-10-CM

## 2012-06-18 DIAGNOSIS — I359 Nonrheumatic aortic valve disorder, unspecified: Secondary | ICD-10-CM

## 2012-06-18 DIAGNOSIS — I4891 Unspecified atrial fibrillation: Secondary | ICD-10-CM

## 2012-06-18 NOTE — Progress Notes (Signed)
HPI The patient presents for evaluation of atrial fibrillation and a slightly reduced ejection fraction.  Atrial fibrillation was in the last visit. We did put a Holter monitor on her which demonstrated no significant bradycardia arrhythmias and good rate control. Since that time the patient has had no new symptoms. She denies any chest pressure, neck or arm discomfort. She doesn't really notice the palpitations and has had no presyncope or syncope. He does her household chores and lives independently. She doesn't describe any new shortness of breath, PND or orthopnea. She's had no weight gain has some chronic mild ankle edema. She's had no problems tolerating anticoagulation.  Allergies  Allergen Reactions  . Codeine Hives and Shortness Of Breath  . Boniva (Ibandronic Acid)   . Fosamax (Alendronate Sodium) Hives and Nausea And Vomiting  . Septra (Sulfamethoxazole-Tmp Ds)   . Tramadol   . Vioxx (Rofecoxib)   . Sulfa Antibiotics Nausea And Vomiting    Current Outpatient Prescriptions  Medication Sig Dispense Refill  . bimatoprost (LUMIGAN) 0.01 % SOLN Place 1 drop into both eyes at bedtime.      . brimonidine (ALPHAGAN P) 0.1 % SOLN       . Calcium Carbonate-Vitamin D (CALCIUM-D) 600-400 MG-UNIT TABS Take 1 tablet by mouth 2 (two) times daily.       Marland Kitchen denosumab (PROLIA) 60 MG/ML SOLN injection Inject 60 mg into the skin every 6 (six) months. Administer in upper arm, thigh, or abdomen      . metoprolol tartrate (LOPRESSOR) 25 MG tablet Take 25 mg by mouth 2 (two) times daily.      Marland Kitchen warfarin (COUMADIN) 4 MG tablet 4 mg Wed and Sat  2 mg all other days  30 tablet  2   No current facility-administered medications for this visit.    Past Medical History  Diagnosis Date  . Hypertension   . DVT (deep venous thrombosis)   . PE (pulmonary embolism)   . Hypercholesterolemia   . Atrial fibrillation     Past Surgical History  Procedure Laterality Date  . Cholecystectomy    . Kyphoplasty       ROS:  As stated in the HPI and negative for all other systems.   PHYSICAL EXAM BP 140/100  Pulse 64  Wt 92 lb 9.6 oz (42.003 kg)  BMI 17.51 kg/m2 GENERAL:  Well appearing, frail NECK:  No jugular venous distention, waveform within normal limits, carotid upstroke brisk and symmetric, no bruits, no thyromegaly LUNGS:  Clear to auscultation bilaterally BACK:  Lordosis, no CVA tenderness, lordosis HEART:  PMI not displaced or sustained,S1 and S2 within normal limits, no S3,no clicks, no rubs, apical systolic and early peaking and heard slightly out the aortic outflow tract, no diastolic murmurs,irregular  ABD:  Flat, positive bowel sounds normal in frequency in pitch, no bruits, no rebound, no guarding, no midline pulsatile mass, no hepatomegaly, no splenomegaly EXT:  2 plus pulses throughout, mild ankle edema, no cyanosis no clubbing, dependent rubor   ASSESSMENT AND PLAN  ATRIAL FIBRILLATION:  The patient has atrial fibrillation. I She is not feeling tachypalpitations in particular except as above.  24-hour Holter demonstrated she has good rate control. She's on anticoagulation and I would see no reason to change this.  HTN:  She was recently started on a beta blocker which is a good choice given the fibrillation. Further evaluation of her blood pressure will be per her primary physician.  CARDIOMYOPATHY:  Given this history and what I suspect  is probably some moderate aortic stenosis I will check an echocardiogram which has not been done about 6 years.

## 2012-06-18 NOTE — Patient Instructions (Addendum)
The current medical regimen is effective;  continue present plan and medications.  Your physician has requested that you have an echocardiogram. Echocardiography is a painless test that uses sound waves to create images of your heart. It provides your doctor with information about the size and shape of your heart and how well your heart's chambers and valves are working. This procedure takes approximately one hour. There are no restrictions for this procedure. Check in at the main entrance for [facility].  Follow up in 6 months with Dr Antoine Poche.  You will receive a letter in the mail 2 months before you are due.  Please call us when you receive this letter to schedule your follow up appointment.

## 2012-06-20 ENCOUNTER — Ambulatory Visit (HOSPITAL_COMMUNITY)
Admission: RE | Admit: 2012-06-20 | Discharge: 2012-06-20 | Disposition: A | Discharge status: Home / Self Care | Payer: Medicare Other | Source: Ambulatory Visit | Attending: Cardiology | Admitting: Cardiology

## 2012-06-20 DIAGNOSIS — I369 Nonrheumatic tricuspid valve disorder, unspecified: Secondary | ICD-10-CM

## 2012-06-20 DIAGNOSIS — I4891 Unspecified atrial fibrillation: Secondary | ICD-10-CM

## 2012-06-20 DIAGNOSIS — I359 Nonrheumatic aortic valve disorder, unspecified: Secondary | ICD-10-CM | POA: Insufficient documentation

## 2012-06-20 DIAGNOSIS — I10 Essential (primary) hypertension: Secondary | ICD-10-CM | POA: Insufficient documentation

## 2012-06-20 NOTE — Progress Notes (Signed)
*  PRELIMINARY RESULTS* Echocardiogram 2D Echocardiogram has been performed.  Conrad Northlake 06/20/2012, 1:44 PM

## 2012-06-26 ENCOUNTER — Telehealth: Payer: Self-pay | Admitting: Cardiology

## 2012-06-26 NOTE — Telephone Encounter (Signed)
NA x multiple rings

## 2012-06-26 NOTE — Telephone Encounter (Signed)
New Problem:    Patient called in returning a call from you.  Please call back.

## 2012-06-27 NOTE — Telephone Encounter (Signed)
Provided patient with results from Echocardiogram. Normal EF and no changes to medications or therapy at this time per MD. Patient verified understanding of no changes in therapy or plan.

## 2012-06-27 NOTE — Telephone Encounter (Signed)
Follow Up ° ° ° ° °Pt returning call from yesterday. Please call back. °

## 2012-07-01 ENCOUNTER — Ambulatory Visit (INDEPENDENT_AMBULATORY_CARE_PROVIDER_SITE_OTHER): Payer: Medicare Other | Admitting: Pharmacist Clinician (PhC)/ Clinical Pharmacy Specialist

## 2012-07-01 DIAGNOSIS — Z7901 Long term (current) use of anticoagulants: Secondary | ICD-10-CM

## 2012-07-01 DIAGNOSIS — I4891 Unspecified atrial fibrillation: Secondary | ICD-10-CM

## 2012-07-01 DIAGNOSIS — I82401 Acute embolism and thrombosis of unspecified deep veins of right lower extremity: Secondary | ICD-10-CM

## 2012-07-01 LAB — POCT INR: INR: 2.3

## 2012-07-03 ENCOUNTER — Telehealth: Payer: Self-pay | Admitting: Pharmacist

## 2012-07-03 NOTE — Telephone Encounter (Signed)
Having 3 teeth pulled next week.  Dentist told her to hold for 5 days prior, Candace [name] told her to hold just 1 day. I agreed with Marcelino Duster but since they are planning to pull 3 teeth suggested she hold 2 days prior. Restart warfarin the night after procedure as long as bleeding controlled.

## 2012-07-31 ENCOUNTER — Other Ambulatory Visit: Payer: Self-pay | Admitting: Pharmacist

## 2012-07-31 ENCOUNTER — Telehealth: Payer: Self-pay | Admitting: Pharmacist

## 2012-07-31 DIAGNOSIS — M810 Age-related osteoporosis without current pathological fracture: Secondary | ICD-10-CM

## 2012-07-31 MED ORDER — DENOSUMAB 60 MG/ML ~~LOC~~ SOLN: 60.0000 mg | SUBCUTANEOUS | Status: DC

## 2012-07-31 NOTE — Telephone Encounter (Signed)
appt made for prolia injection for August 6th at 3:30pm

## 2012-08-01 ENCOUNTER — Other Ambulatory Visit: Payer: Self-pay | Admitting: *Deleted

## 2012-08-01 MED ORDER — FUROSEMIDE 20 MG PO TABS: ORAL_TABLET | ORAL | Status: DC

## 2012-08-01 NOTE — Telephone Encounter (Signed)
Received refill request from [facility] for lasix with these directions. Do not see on patients med list. Please advise

## 2012-08-05 ENCOUNTER — Ambulatory Visit (INDEPENDENT_AMBULATORY_CARE_PROVIDER_SITE_OTHER): Payer: Medicare Other | Admitting: Pharmacist Clinician (PhC)/ Clinical Pharmacy Specialist

## 2012-08-05 VITALS — BP 120/72

## 2012-08-05 DIAGNOSIS — Z7901 Long term (current) use of anticoagulants: Secondary | ICD-10-CM

## 2012-08-05 DIAGNOSIS — I82401 Acute embolism and thrombosis of unspecified deep veins of right lower extremity: Secondary | ICD-10-CM

## 2012-08-05 DIAGNOSIS — I4891 Unspecified atrial fibrillation: Secondary | ICD-10-CM

## 2012-08-05 DIAGNOSIS — I82409 Acute embolism and thrombosis of unspecified deep veins of unspecified lower extremity: Secondary | ICD-10-CM

## 2012-08-05 LAB — POCT INR: INR: 7.2

## 2012-08-05 MED ORDER — PHYTONADIONE 5 MG PO TABS: 5.0000 mg | ORAL_TABLET | Freq: Once | ORAL | Status: DC

## 2012-08-05 NOTE — Progress Notes (Signed)
Patient's INR increased without any apparent reason.  No signs or symptoms of bleeding and hemoglobin is normal.  No dizziness.  INR re-checked in lab and was 7.0.  Gave patient in office vitamin K oral 5mg  x 1.  She is to return tomorrow for a repeat INR and if bleeding occurs call 911.  Patient was cautioned to avoid falls and hitting her head and to take it easy until her check tomorrow.  Patient may not be a candidate to continue warfarin.  Risk versus benefit should be established.

## 2012-08-06 ENCOUNTER — Ambulatory Visit (INDEPENDENT_AMBULATORY_CARE_PROVIDER_SITE_OTHER): Payer: Medicare Other | Admitting: Pharmacist

## 2012-08-06 DIAGNOSIS — I82409 Acute embolism and thrombosis of unspecified deep veins of unspecified lower extremity: Secondary | ICD-10-CM

## 2012-08-06 DIAGNOSIS — R799 Abnormal finding of blood chemistry, unspecified: Secondary | ICD-10-CM

## 2012-08-06 DIAGNOSIS — I4891 Unspecified atrial fibrillation: Secondary | ICD-10-CM

## 2012-08-06 DIAGNOSIS — Z7901 Long term (current) use of anticoagulants: Secondary | ICD-10-CM

## 2012-08-06 DIAGNOSIS — I82401 Acute embolism and thrombosis of unspecified deep veins of right lower extremity: Secondary | ICD-10-CM

## 2012-08-06 DIAGNOSIS — R791 Abnormal coagulation profile: Secondary | ICD-10-CM

## 2012-08-06 LAB — POCT CBC
Granulocyte percent: 72.7 %G (ref 37–80)
Hemoglobin: 13.7 g/dL (ref 12.2–16.2)
Lymph, poc: 1.3 (ref 0.6–3.4)
MCH, POC: 29.6 pg (ref 27–31.2)
MPV: 7.6 fL (ref 0–99.8)
POC Granulocyte: 4.4 (ref 2–6.9)
POC LYMPH PERCENT: 21.6 %L (ref 10–50)
Platelet Count, POC: 190 10*3/uL (ref 142–424)
RBC: 4.6 M/uL (ref 4.04–5.48)

## 2012-08-06 NOTE — Patient Instructions (Signed)
Anticoagulation Dose Instructions as of 08/06/2012     Candace [name] Tue Wed Thu Fri Sat   New Dose 2 mg 2 mg 2 mg 2 mg 2 mg 2 mg 2 mg    Description       Start 1/2 tablet daily [= 2mg ]       INR was 1.7 today

## 2012-08-07 ENCOUNTER — Telehealth: Payer: Self-pay | Admitting: Pharmacist

## 2012-08-07 NOTE — Telephone Encounter (Signed)
Patient instructed to continue as planned to take 1/2 tablet of warfarin daily until returns for recheck next week

## 2012-08-13 ENCOUNTER — Ambulatory Visit (INDEPENDENT_AMBULATORY_CARE_PROVIDER_SITE_OTHER): Payer: Medicare Other | Admitting: Pharmacist

## 2012-08-13 DIAGNOSIS — I4891 Unspecified atrial fibrillation: Secondary | ICD-10-CM

## 2012-08-13 DIAGNOSIS — M810 Age-related osteoporosis without current pathological fracture: Secondary | ICD-10-CM

## 2012-08-13 DIAGNOSIS — I82401 Acute embolism and thrombosis of unspecified deep veins of right lower extremity: Secondary | ICD-10-CM

## 2012-08-13 DIAGNOSIS — I82409 Acute embolism and thrombosis of unspecified deep veins of unspecified lower extremity: Secondary | ICD-10-CM

## 2012-08-13 DIAGNOSIS — Z7901 Long term (current) use of anticoagulants: Secondary | ICD-10-CM

## 2012-08-13 DIAGNOSIS — M8000XA Age-related osteoporosis with current pathological fracture, unspecified site, initial encounter for fracture: Secondary | ICD-10-CM | POA: Insufficient documentation

## 2012-08-13 LAB — POCT INR: INR: 1.9

## 2012-08-13 MED ORDER — DENOSUMAB 60 MG/ML ~~LOC~~ SOLN
60.0000 mg | Freq: Once | SUBCUTANEOUS | Status: AC
  Administered 2012-08-13: 60 mg via SUBCUTANEOUS

## 2012-08-13 NOTE — Progress Notes (Signed)
Patient with osteoporosis.  Here to day for her Prolia injection.  This will make her 3rd Prolia.  Last was administered in Feb 2014.   She is taking calcium daily  Assessment:  Osteoporosis  Plan:  Administered prolia 60mg  SQ in upper right arm  Next injection due 02/2013.

## 2012-08-13 NOTE — Patient Instructions (Signed)
Anticoagulation Dose Instructions as of 08/13/2012     Candace [name] Tue Wed Thu Fri Sat   New Dose 2 mg 2 mg 2 mg 2 mg 2 mg 2 mg 2 mg    Description       Take 1 tablet today, then restart 1/2 tablet daily [= 2mg ]       INR was 1.9 today

## 2012-08-22 ENCOUNTER — Telehealth: Payer: Self-pay | Admitting: Family Medicine

## 2012-08-25 NOTE — Telephone Encounter (Signed)
Per last office visit patient is to take warfarin 2mg  daily ( 1/2 tablet daily). Patient was called and she verbally confirmed directions.

## 2012-09-01 ENCOUNTER — Ambulatory Visit (INDEPENDENT_AMBULATORY_CARE_PROVIDER_SITE_OTHER): Payer: Medicare Other | Admitting: Pharmacist

## 2012-09-01 DIAGNOSIS — I82409 Acute embolism and thrombosis of unspecified deep veins of unspecified lower extremity: Secondary | ICD-10-CM

## 2012-09-01 DIAGNOSIS — I4891 Unspecified atrial fibrillation: Secondary | ICD-10-CM

## 2012-09-01 DIAGNOSIS — Z7901 Long term (current) use of anticoagulants: Secondary | ICD-10-CM

## 2012-09-01 DIAGNOSIS — I82401 Acute embolism and thrombosis of unspecified deep veins of right lower extremity: Secondary | ICD-10-CM

## 2012-09-01 LAB — POCT INR: INR: 1.5

## 2012-09-01 NOTE — Patient Instructions (Signed)
Anticoagulation Dose Instructions as of 09/01/2012     Candace [name] Tue Wed Thu Fri Sat   New Dose 2 mg 2 mg 2 mg 2 mg 2 mg 4 mg 2 mg    Description       Take 1 tablet today and tomorrow, then start 1 tablet on Fridays and 1/2 tablet all other days      INR was 1.5 today

## 2012-09-16 ENCOUNTER — Ambulatory Visit (INDEPENDENT_AMBULATORY_CARE_PROVIDER_SITE_OTHER): Payer: Medicare Other | Admitting: Family Medicine

## 2012-09-16 ENCOUNTER — Encounter: Payer: Self-pay | Admitting: Family Medicine

## 2012-09-16 VITALS — BP 161/98 | HR 83 | Temp 97.3°F | Ht <= 58 in | Wt 92.0 lb

## 2012-09-16 DIAGNOSIS — I82409 Acute embolism and thrombosis of unspecified deep veins of unspecified lower extremity: Secondary | ICD-10-CM

## 2012-09-16 DIAGNOSIS — Z7901 Long term (current) use of anticoagulants: Secondary | ICD-10-CM

## 2012-09-16 DIAGNOSIS — I82401 Acute embolism and thrombosis of unspecified deep veins of right lower extremity: Secondary | ICD-10-CM

## 2012-09-16 DIAGNOSIS — I4891 Unspecified atrial fibrillation: Secondary | ICD-10-CM

## 2012-09-16 LAB — POCT INR: INR: 2.7

## 2012-09-16 NOTE — Patient Instructions (Addendum)
Anticoagulation Dose Instructions as of 09/16/2012     Candace [name] Tue Wed Thu Fri Sat   New Dose 2 mg 2 mg 2 mg 2 mg 2 mg 4 mg 2 mg    Description       1 tablet on Fridays and 1/2 tablet all other days

## 2012-09-16 NOTE — Progress Notes (Signed)
Patient seen for this visit for INR check.

## 2012-09-27 ENCOUNTER — Emergency Department (HOSPITAL_COMMUNITY)
Admission: EM | Admit: 2012-09-27 | Discharge: 2012-09-27 | Disposition: A | Discharge status: Home / Self Care | Payer: Medicare Other | Attending: Emergency Medicine | Admitting: Emergency Medicine

## 2012-09-27 ENCOUNTER — Emergency Department (HOSPITAL_COMMUNITY): Payer: Medicare Other

## 2012-09-27 ENCOUNTER — Encounter (HOSPITAL_COMMUNITY): Payer: Self-pay

## 2012-09-27 DIAGNOSIS — I4891 Unspecified atrial fibrillation: Secondary | ICD-10-CM | POA: Insufficient documentation

## 2012-09-27 DIAGNOSIS — I10 Essential (primary) hypertension: Secondary | ICD-10-CM | POA: Insufficient documentation

## 2012-09-27 DIAGNOSIS — Z79899 Other long term (current) drug therapy: Secondary | ICD-10-CM | POA: Insufficient documentation

## 2012-09-27 DIAGNOSIS — Z86711 Personal history of pulmonary embolism: Secondary | ICD-10-CM | POA: Insufficient documentation

## 2012-09-27 DIAGNOSIS — Z8639 Personal history of other endocrine, nutritional and metabolic disease: Secondary | ICD-10-CM | POA: Insufficient documentation

## 2012-09-27 DIAGNOSIS — R0989 Other specified symptoms and signs involving the circulatory and respiratory systems: Secondary | ICD-10-CM | POA: Insufficient documentation

## 2012-09-27 DIAGNOSIS — I498 Other specified cardiac arrhythmias: Secondary | ICD-10-CM | POA: Insufficient documentation

## 2012-09-27 DIAGNOSIS — Z862 Personal history of diseases of the blood and blood-forming organs and certain disorders involving the immune mechanism: Secondary | ICD-10-CM | POA: Insufficient documentation

## 2012-09-27 DIAGNOSIS — Z86718 Personal history of other venous thrombosis and embolism: Secondary | ICD-10-CM | POA: Insufficient documentation

## 2012-09-27 LAB — CBC WITH DIFFERENTIAL/PLATELET
Basophils Absolute: 0 10*3/uL (ref 0.0–0.1)
Basophils Relative: 0 % (ref 0–1)
Eosinophils Relative: 1 % (ref 0–5)
HCT: 43.6 % (ref 36.0–46.0)
Lymphocytes Relative: 26 % (ref 12–46)
MCH: 29.9 pg (ref 26.0–34.0)
MCHC: 32.1 g/dL (ref 30.0–36.0)
MCV: 93 fL (ref 78.0–100.0)
Monocytes Absolute: 0.5 10*3/uL (ref 0.1–1.0)
Neutrophils Relative %: 61 % (ref 43–77)
RDW: 14.6 % (ref 11.5–15.5)

## 2012-09-27 LAB — BASIC METABOLIC PANEL
CO2: 28 mEq/L (ref 19–32)
Calcium: 8.9 mg/dL (ref 8.4–10.5)
Creatinine, Ser: 1.03 mg/dL (ref 0.50–1.10)
Potassium: 3.4 mEq/L — ABNORMAL LOW (ref 3.5–5.1)
Sodium: 139 mEq/L (ref 135–145)

## 2012-09-27 LAB — TROPONIN I: Troponin I: <0.3 ng/mL (ref <0.30)

## 2012-09-27 MED ORDER — FUROSEMIDE 10 MG/ML IJ SOLN
40.0000 mg | Freq: Once | INTRAMUSCULAR | Status: AC
  Administered 2012-09-27: 40 mg via INTRAVENOUS
  Filled 2012-09-27: qty 4

## 2012-09-27 NOTE — ED Notes (Signed)
Pt alert & oriented x4, stable gait. Patient given discharge instructions, paperwork & prescription(s). Patient  instructed to stop at the registration desk to finish any additional paperwork. Patient verbalized understanding. Pt left department w/ no further questions. 

## 2012-09-27 NOTE — ED Notes (Signed)
Pt c/o SOB with exertion for the past 3 or 4 days.  Denies any chest pain. Pt says has chronic left shoulder pain, states is arthritis.

## 2012-09-27 NOTE — ED Notes (Signed)
Patient ambulatory to restroom with assistance, steady gait, family at the bedside.

## 2012-09-27 NOTE — ED Notes (Addendum)
MD at the bedside to speak with family 

## 2012-09-27 NOTE — ED Provider Notes (Addendum)
CSN: [id number]     Arrival date & time 09/27/12  1805 History  This chart was scribed for Donnetta Hutching, MD by Shari Heritage, ED Scribe. The patient was seen in room APA05/APA05. Patient's care was started at 6:14 PM.   Chief Complaint  Patient presents with  . Shortness of Breath    The history is provided by the patient. No language interpreter was used.    HPI Comments: Candace [name] is a 79 y.o. female with history of HTN, DVT/PE, hypercholesterolemia, and atrial fibrillation who presents to the Emergency Department complaining of intermittent, moderate shortness of breath onset 3-4 days ago. Dyspnea is worse with exertion. She denies associated chest pain. She states that she has no history of pulmonary problems. She is currently taking Coumadin 4 mg 2 days a week and 2 mg 5 days a week and metoprolol 25 mg bid. She is also reporting some left shoulder pain but states that this is chronic due to arthritis. She has a surgical history of cholecystectomy and kyphoplasty.   Past Medical History  Diagnosis Date  . Hypertension   . DVT (deep venous thrombosis)   . PE (pulmonary embolism)   . Hypercholesterolemia   . Atrial fibrillation    Past Surgical History  Procedure Laterality Date  . Cholecystectomy    . Kyphoplasty     Family History  Problem Relation Age of Onset  . Stroke Father    History  Substance Use Topics  . Smoking status: Never Smoker   . Smokeless tobacco: Current User    Types: Snuff  . Alcohol Use: No   OB History   Grav Para Term Preterm Abortions TAB SAB Ect Mult Living                 Review of Systems A complete 10 system review of systems was obtained and all systems are negative except as noted in the HPI and PMH.   Allergies  Codeine; Boniva; Fosamax; Septra; Tramadol; Vioxx; and Sulfa antibiotics  Home Medications   Current Outpatient Rx  Name  Route  Sig  Dispense  Refill  . bimatoprost (LUMIGAN) 0.01 % SOLN   Both Eyes   Place 1 drop  into both eyes at bedtime.         . brimonidine (ALPHAGAN P) 0.1 % SOLN               . Calcium Carbonate-Vitamin D (CALCIUM-D) 600-400 MG-UNIT TABS   Oral   Take 1 tablet by mouth 2 (two) times daily.          Marland Kitchen denosumab (PROLIA) 60 MG/ML SOLN injection   Subcutaneous   Inject 60 mg into the skin every 6 (six) months. Bring to office for administration   1 mL   1   . furosemide (LASIX) 20 MG tablet      Take 1/2 tablet by mouth in the morning with a banana   30 tablet   3   . metoprolol tartrate (LOPRESSOR) 25 MG tablet   Oral   Take 25 mg by mouth 2 (two) times daily.         Marland Kitchen warfarin (COUMADIN) 4 MG tablet      4 mg Wed and Sat  2 mg all other days   30 tablet   2    Triage Vitals: BP 193/86  Pulse 38  Temp(Src) 97.5 F (36.4 C) (Oral)  Resp 26  Ht 5' (1.524 m)  Wt 92 lb (  41.731 kg)  BMI 17.97 kg/m2  SpO2 97% Physical Exam  Nursing note and vitals reviewed. Constitutional: She is oriented to person, place, and time. She appears well-developed and well-nourished.  HENT:  Head: Normocephalic and atraumatic.  Eyes: Conjunctivae and EOM are normal. Pupils are equal, round, and reactive to light.  Neck: Normal range of motion. Neck supple.  Cardiovascular: Regular rhythm and normal heart sounds.  Bradycardia present.   Pulmonary/Chest: Effort normal and breath sounds normal.  Abdominal: Soft. Bowel sounds are normal.  Musculoskeletal: Normal range of motion. She exhibits no edema.  Neurological: She is alert and oriented to person, place, and time.  Skin: Skin is warm and dry. There is pallor (mild).  Psychiatric: She has a normal mood and affect.    ED Course  Procedures (including critical care time) DIAGNOSTIC STUDIES: Oxygen Saturation is 97% on room air, adequate by my interpretation.    COORDINATION OF CARE: 6:18 PM- Will order labs and CXR. Patient informed of current plan for treatment and evaluation and agrees with plan at this time.      Labs Review Labs Reviewed  BASIC METABOLIC PANEL - Abnormal; Notable for the following:    Potassium 3.4 (*)    Glucose, Bld 132 (*)    GFR calc non Af Amer 47 (*)    GFR calc Af Amer 55 (*)    All other components within normal limits  PRO B NATRIURETIC PEPTIDE - Abnormal; Notable for the following:    Pro B Natriuretic peptide (BNP) 6311.0 (*)    All other components within normal limits  CBC WITH DIFFERENTIAL  TROPONIN I   Imaging Review Dg Chest Portable 1 View  09/27/2012   CLINICAL DATA:  Irregular heart rate. Weakness in shortness of Breath  EXAM: PORTABLE CHEST - 1 VIEW  COMPARISON:  10/23/2011  FINDINGS: Marked stress set of moderate cardiac enlargement. There is no pleural effusion identified. Pulmonary vascular congestion is identified. No airspace consolidation noted.  IMPRESSION: 1. Cardiac enlargement and pulmonary vascular congestion   Electronically Signed   By: Signa Kell M.D.   On: 09/27/2012 18:42      Date: 09/27/2012  Rate: 54  Rhythm: junctional  QRS Axis: normal  Intervals: normal  ST/T Wave abnormalities: normal  Conduction Disutrbances: RBBB  Narrative Interpretation: unremarkable    MDM  No diagnosis found. Patient is alert, pleasant, no obvious dyspnea. She is ambulatory. No chest pain.  Chest x-ray reveals pulmonary vascular congestion. Patient advised to double her Lasix in the morning. She will return if worse.  Findings discussed with patient, her daughter, her granddaughter  I personally performed the services described in this documentation, which was scribed in my presence. The recorded information has been reviewed and is accurate.    Donnetta Hutching, MD 09/27/12 2117  Donnetta Hutching, MD 10/01/12 [phone]

## 2012-09-29 ENCOUNTER — Ambulatory Visit (INDEPENDENT_AMBULATORY_CARE_PROVIDER_SITE_OTHER): Payer: Medicare Other | Admitting: Nurse Practitioner

## 2012-09-29 ENCOUNTER — Telehealth: Payer: Self-pay | Admitting: Nurse Practitioner

## 2012-09-29 ENCOUNTER — Ambulatory Visit (INDEPENDENT_AMBULATORY_CARE_PROVIDER_SITE_OTHER): Payer: Medicare Other | Admitting: Pharmacist

## 2012-09-29 VITALS — BP 181/83 | HR 47 | Temp 96.5°F | Wt 92.0 lb

## 2012-09-29 DIAGNOSIS — I82409 Acute embolism and thrombosis of unspecified deep veins of unspecified lower extremity: Secondary | ICD-10-CM

## 2012-09-29 DIAGNOSIS — I4891 Unspecified atrial fibrillation: Secondary | ICD-10-CM

## 2012-09-29 DIAGNOSIS — I82401 Acute embolism and thrombosis of unspecified deep veins of right lower extremity: Secondary | ICD-10-CM

## 2012-09-29 DIAGNOSIS — I10 Essential (primary) hypertension: Secondary | ICD-10-CM

## 2012-09-29 DIAGNOSIS — Z7901 Long term (current) use of anticoagulants: Secondary | ICD-10-CM

## 2012-09-29 LAB — POCT INR: INR: 3.2

## 2012-09-29 MED ORDER — LISINOPRIL 40 MG PO TABS: 40.0000 mg | ORAL_TABLET | Freq: Every day | ORAL | Status: DC

## 2012-09-29 MED ORDER — METOPROLOL TARTRATE 25 MG PO TABS: ORAL_TABLET | ORAL | Status: DC

## 2012-09-29 MED ORDER — WARFARIN SODIUM 2 MG PO TABS: 2.0000 mg | ORAL_TABLET | Freq: Every day | ORAL | Status: DC

## 2012-09-29 NOTE — Patient Instructions (Signed)
Anticoagulation Dose Instructions as of 09/29/2012     Candace [name] Tue Wed Thu Fri Sat   New Dose 2 mg 2 mg 2 mg 2 mg 2 mg 2 mg 2 mg    Description       No warfarin today then start 2mg  daily (either 1/2 of 4mg  or 1 tablet of 2mg )      INR was 3.2 today

## 2012-09-29 NOTE — Telephone Encounter (Signed)
Advised patient that after seeing pharmacist today that triage will work her in with a provider

## 2012-09-29 NOTE — Patient Instructions (Signed)

## 2012-09-29 NOTE — Progress Notes (Signed)
  Subjective:    Patient ID: Candace [name], female    DOB: 16-Jul-1922, 79 y.o.   MRN: [id number]  HPI Patient went to hospital with SOB Saturday night- she was given Iv meds to get rid of fluid and the they sent her home- [facility] feels better today and increased her fluid pills at home. Blood pressure is elevated and heart rate is low. She was recently put on metoprlol for tachycardia and she hasn't felt good since.    Review of Systems  All other systems reviewed and are negative.       Objective:   Physical Exam  Constitutional: She is oriented to person, place, and time. She appears well-developed and well-nourished.  HENT:  Nose: Nose normal.  Mouth/Throat: Oropharynx is clear and moist.  Eyes: EOM are normal.  Neck: Trachea normal, normal range of motion and full passive range of motion without pain. Neck supple. No JVD present. Carotid bruit is not present. No thyromegaly present.  Cardiovascular: Normal rate, regular rhythm, normal heart sounds and intact distal pulses.  Exam reveals no gallop and no friction rub.   No murmur heard. Pulmonary/Chest: Effort normal and breath sounds normal.  Abdominal: Soft. Bowel sounds are normal. She exhibits no distension and no mass. There is no tenderness.  Musculoskeletal: Normal range of motion.  Lymphadenopathy:    She has no cervical adenopathy.  Neurological: She is alert and oriented to person, place, and time. She has normal reflexes.  Skin: Skin is warm and dry.  Psychiatric: She has a normal mood and affect. Her behavior is normal. Judgment and thought content normal.   BP 181/83  Pulse 47  Temp(Src) 96.5 F (35.8 C) (Oral)  Wt 92 lb (41.731 kg)  BMI 17.97 kg/m2  SpO2 97%        Assessment & Plan:   1. HTN (hypertension)   2. Atrial fibrillation   3. Hospital Follow up CHF Meds ordered this encounter  Medications  . metoprolol tartrate (LOPRESSOR) 25 MG tablet    Sig: 1/2 po BID    Dispense:  30 tablet   Refill:  5    Order Specific Question:  Supervising Provider    Answer:  Ernestina Penna [[id number]]  . lisinopril (PRINIVIL,ZESTRIL) 40 MG tablet    Sig: Take 1 tablet (40 mg total) by mouth daily.    Dispense:  90 tablet    Refill:  1    Order Specific Question:  Supervising Provider    Answer:  Ernestina Penna [[id number]]   Keep check of Blood pressure and heart rate at home Recheck in 1 week  Mary-Margaret Daphine Deutscher, FNP

## 2012-10-06 ENCOUNTER — Ambulatory Visit (INDEPENDENT_AMBULATORY_CARE_PROVIDER_SITE_OTHER): Payer: Medicare Other | Admitting: Nurse Practitioner

## 2012-10-06 ENCOUNTER — Encounter: Payer: Self-pay | Admitting: Nurse Practitioner

## 2012-10-06 VITALS — BP 185/82 | HR 58 | Temp 96.7°F | Ht 59.0 in | Wt 89.0 lb

## 2012-10-06 DIAGNOSIS — I10 Essential (primary) hypertension: Secondary | ICD-10-CM

## 2012-10-06 MED ORDER — AMLODIPINE BESYLATE 5 MG PO TABS: 5.0000 mg | ORAL_TABLET | Freq: Every day | ORAL | Status: DC

## 2012-10-06 NOTE — Patient Instructions (Addendum)

## 2012-10-06 NOTE — Progress Notes (Signed)
  Subjective:    Patient ID: Candace [name], female    DOB: [birth year], 79 y.o.   MRN: [id number]  HPI Pt in to office for follow-up for pt's b/p. Pt's blood pressure still high. Pt denies any headaches. Pt states she takes lasix daily, lisinopril daily, and lopressor 1/2 BID everyday.    Review of Systems  All other systems reviewed and are negative.       Objective:   Physical Exam  Vitals reviewed. Constitutional: She is oriented to person, place, and time. She appears well-developed and well-nourished.  Cardiovascular: Normal rate, regular rhythm, normal heart sounds and intact distal pulses.   Pulmonary/Chest: Effort normal and breath sounds normal. No respiratory distress. She has no wheezes.  Diminished breath sounds   Abdominal: Soft. Bowel sounds are normal. She exhibits no distension. There is no tenderness.  Musculoskeletal: Normal range of motion.  Neurological: She is alert and oriented to person, place, and time.  Skin: Skin is warm and dry.    BP 202/90  Pulse 58  Temp(Src) 96.7 F (35.9 C) (Oral)  Ht 4\' 11"  (1.499 m)  Wt 89 lb (40.37 kg)  BMI 17.97 kg/m2       Assessment & Plan:   Uncontrolled hypertension  Meds ordered this encounter  Medications  . amLODipine (NORVASC) 5 MG tablet    Sig: Take 1 tablet (5 mg total) by mouth daily.    Dispense:  30 tablet    Refill:  5    Order Specific Question:  Supervising Provider    Answer:  Deborra Medina    Continue all meds Labs pending Diet and exercise encouraged Health maintenance reviewed Follow up in 1 week  Mary-Margaret Daphine Deutscher, FNP

## 2012-10-10 ENCOUNTER — Ambulatory Visit (INDEPENDENT_AMBULATORY_CARE_PROVIDER_SITE_OTHER): Payer: Medicare Other | Admitting: Nurse Practitioner

## 2012-10-10 ENCOUNTER — Encounter: Payer: Self-pay | Admitting: Nurse Practitioner

## 2012-10-10 VITALS — BP 132/82 | HR 92 | Temp 96.5°F | Ht 59.0 in | Wt 89.0 lb

## 2012-10-10 DIAGNOSIS — I10 Essential (primary) hypertension: Secondary | ICD-10-CM

## 2012-10-10 NOTE — Patient Instructions (Signed)

## 2012-10-10 NOTE — Progress Notes (Addendum)
  Subjective:    Patient ID: Candace [name], female    DOB: 02/26/1922, 79 y.o.   MRN: [id number]  HPI Patient in today for blood pressure recheck- We have recently made some changes in medication and she is here for blood pressre recheck- At home blood pressure running 130"s systolic. SHe says she is feeling much better since we have made changes to meds.    Review of Systems  All other systems reviewed and are negative.       Objective:   Physical Exam  Constitutional: She appears well-developed and well-nourished.  Cardiovascular: Normal rate, regular rhythm and normal heart sounds.   Pulmonary/Chest: Effort normal and breath sounds normal.   BP 140/92  Pulse 92  Temp(Src) 96.5 F (35.8 C) (Oral)  Ht 4\' 11"  (1.499 m)  Wt 89 lb (40.37 kg)  BMI 17.97 kg/m2        Assessment & Plan:   1. HTN (hypertension)      Continue all meds Labs pending Diet and exercise encouraged Health maintenance reviewed Follow up in 3 months  Mary-Margaret Daphine Deutscher, FNP

## 2012-10-30 ENCOUNTER — Ambulatory Visit (INDEPENDENT_AMBULATORY_CARE_PROVIDER_SITE_OTHER): Payer: Medicare Other | Admitting: Pharmacist

## 2012-10-30 DIAGNOSIS — I4891 Unspecified atrial fibrillation: Secondary | ICD-10-CM

## 2012-10-30 DIAGNOSIS — I82401 Acute embolism and thrombosis of unspecified deep veins of right lower extremity: Secondary | ICD-10-CM

## 2012-10-30 DIAGNOSIS — I82409 Acute embolism and thrombosis of unspecified deep veins of unspecified lower extremity: Secondary | ICD-10-CM

## 2012-10-30 DIAGNOSIS — Z7901 Long term (current) use of anticoagulants: Secondary | ICD-10-CM

## 2012-10-30 LAB — POCT INR: INR: 1.4

## 2012-10-30 NOTE — Patient Instructions (Signed)
Anticoagulation Dose Instructions as of 10/30/2012     Candace [name] Tue Wed Thu Fri Sat   New Dose 2 mg 2 mg 2 mg 2 mg 4 mg 2 mg 2 mg    Description       Take 4mg  today (thursday, October 23rd) and tomorrow (Friday, October 24th) then start 2mg  daily (either 1/2 of 4mg  or 1 tablet of 2mg ) except one thursdays take 4mg .      INR was 1.4 today (too thick)

## 2012-11-17 ENCOUNTER — Encounter: Payer: Self-pay | Admitting: Pharmacist

## 2012-11-17 ENCOUNTER — Ambulatory Visit (INDEPENDENT_AMBULATORY_CARE_PROVIDER_SITE_OTHER): Payer: Medicare Other | Admitting: Pharmacist

## 2012-11-17 VITALS — BP 112/72 | HR 74

## 2012-11-17 DIAGNOSIS — I82409 Acute embolism and thrombosis of unspecified deep veins of unspecified lower extremity: Secondary | ICD-10-CM

## 2012-11-17 DIAGNOSIS — I82401 Acute embolism and thrombosis of unspecified deep veins of right lower extremity: Secondary | ICD-10-CM

## 2012-11-17 DIAGNOSIS — I4891 Unspecified atrial fibrillation: Secondary | ICD-10-CM

## 2012-11-17 DIAGNOSIS — Z7901 Long term (current) use of anticoagulants: Secondary | ICD-10-CM

## 2012-11-17 LAB — POCT INR: INR: 1.6

## 2012-11-17 NOTE — Patient Instructions (Signed)
Anticoagulation Dose Instructions as of 11/17/2012     Glynis Smiles Tue Wed Thu Fri Sat   New Dose 2 mg 4 mg 2 mg 2 mg 4 mg 2 mg 2 mg    Description       Increase to 2mg  daily (either 1/2 of 4mg  or 1 tablet of 2mg ) except 4mg  on Mondays and thursdays.      INR was 1.6 today

## 2012-12-01 ENCOUNTER — Ambulatory Visit (INDEPENDENT_AMBULATORY_CARE_PROVIDER_SITE_OTHER): Payer: Medicare Other | Admitting: Pharmacist

## 2012-12-01 DIAGNOSIS — I82401 Acute embolism and thrombosis of unspecified deep veins of right lower extremity: Secondary | ICD-10-CM

## 2012-12-01 DIAGNOSIS — I4891 Unspecified atrial fibrillation: Secondary | ICD-10-CM

## 2012-12-01 DIAGNOSIS — Z7901 Long term (current) use of anticoagulants: Secondary | ICD-10-CM

## 2012-12-01 DIAGNOSIS — I82409 Acute embolism and thrombosis of unspecified deep veins of unspecified lower extremity: Secondary | ICD-10-CM

## 2012-12-01 LAB — POCT INR: INR: 2

## 2012-12-01 NOTE — Patient Instructions (Signed)
Anticoagulation Dose Instructions as of 12/01/2012     Glynis Smiles Tue Wed Thu Fri Sat   New Dose 2 mg 4 mg 2 mg 2 mg 4 mg 2 mg 2 mg    Description       Continue 2mg  daily (either 1/2 of 4mg  or 1 tablet of 2mg ) except 4mg  on Mondays and thursdays.      INR was 2.0 today

## 2012-12-25 ENCOUNTER — Ambulatory Visit (INDEPENDENT_AMBULATORY_CARE_PROVIDER_SITE_OTHER): Payer: Medicare Other | Admitting: Pharmacist

## 2012-12-25 DIAGNOSIS — I82401 Acute embolism and thrombosis of unspecified deep veins of right lower extremity: Secondary | ICD-10-CM

## 2012-12-25 DIAGNOSIS — I4891 Unspecified atrial fibrillation: Secondary | ICD-10-CM

## 2012-12-25 DIAGNOSIS — I82409 Acute embolism and thrombosis of unspecified deep veins of unspecified lower extremity: Secondary | ICD-10-CM

## 2012-12-25 DIAGNOSIS — Z7901 Long term (current) use of anticoagulants: Secondary | ICD-10-CM

## 2012-12-25 NOTE — Patient Instructions (Signed)
Anticoagulation Dose Instructions as of 12/25/2012     Glynis Smiles Tue Wed Thu Fri Sat   New Dose 2 mg 4 mg 2 mg 2 mg 4 mg 2 mg 2 mg    Description       Continue 2mg  daily (either 1/2 of 4mg  or 1 tablet of 2mg ) except 4mg  on Mondays and thursdays.     INR was 2.0 today

## 2013-01-28 ENCOUNTER — Encounter: Payer: Self-pay | Admitting: Nurse Practitioner

## 2013-01-28 ENCOUNTER — Ambulatory Visit (INDEPENDENT_AMBULATORY_CARE_PROVIDER_SITE_OTHER): Payer: Medicare Other | Admitting: Nurse Practitioner

## 2013-01-28 VITALS — BP 146/84 | HR 98 | Temp 96.9°F | Ht 59.0 in | Wt 91.0 lb

## 2013-01-28 DIAGNOSIS — I4891 Unspecified atrial fibrillation: Secondary | ICD-10-CM

## 2013-01-28 DIAGNOSIS — I10 Essential (primary) hypertension: Secondary | ICD-10-CM

## 2013-01-28 DIAGNOSIS — Z7901 Long term (current) use of anticoagulants: Secondary | ICD-10-CM

## 2013-01-28 LAB — POCT INR: INR: 1.8

## 2013-01-28 NOTE — Progress Notes (Signed)
   Subjective:    Patient ID: Candace [name], female    DOB: [birth year], 80 y.o.   MRN: [id number]  Hypertension This is a chronic problem. The current episode started more than 1 year ago. The problem is unchanged. The problem is controlled. Pertinent negatives include no anxiety, chest pain, headaches, neck pain, palpitations, peripheral edema or shortness of breath. There are no associated agents to hypertension. Risk factors for coronary artery disease include family history and post-menopausal state. Past treatments include diuretics, ACE inhibitors and calcium channel blockers. The current treatment provides moderate improvement. There are no compliance problems.   Atrial fib/anticoag therapy Coumadin and metoprolol - working well- INR checked today and meds adjusted- no c/o heart racing osteoporosis prolia- no side effects   Review of Systems  Respiratory: Negative for shortness of breath.   Cardiovascular: Negative for chest pain and palpitations.  Musculoskeletal: Negative for neck pain.  Neurological: Negative for headaches.       Objective:   Physical Exam  Constitutional: She is oriented to person, place, and time. She appears well-developed and well-nourished.  HENT:  Nose: Nose normal.  Mouth/Throat: Oropharynx is clear and moist.  Eyes: EOM are normal.  Neck: Trachea normal, normal range of motion and full passive range of motion without pain. Neck supple. No JVD present. Carotid bruit is not present. No thyromegaly present.  Cardiovascular: Normal rate, regular rhythm and intact distal pulses.  Exam reveals no gallop and no friction rub.   Murmur (2/6 systolic) heard. Pulmonary/Chest: Effort normal and breath sounds normal.  Abdominal: Soft. Bowel sounds are normal. She exhibits no distension and no mass. There is no tenderness.  Musculoskeletal: Normal range of motion.  Lymphadenopathy:    She has no cervical adenopathy.  Neurological: She is alert and oriented to  person, place, and time. She has normal reflexes.  Skin: Skin is warm and dry.  Psychiatric: She has a normal mood and affect. Her behavior is normal. Judgment and thought content normal.    BP 146/84  Pulse 98  Temp(Src) 96.9 F (36.1 C) (Oral)  Ht 4' 11" (1.499 m)  Wt 91 lb (41.277 kg)  BMI 18.37 kg/m2       Assessment & Plan:   1. Atrial fibrillation   2. Long term (current) use of anticoagulants   3. HTN (hypertension)    Orders Placed This Encounter  Procedures  . CMP14+EGFR  . NMR, lipoprofile    Labs pending Health maintenance reviewed Diet and exercise encouraged Continue all meds Follow up  In 3 months   [place], FNP

## 2013-01-28 NOTE — Patient Instructions (Signed)
Anticoagulation Dose Instructions as of 01/28/2013     Dorene Grebe Tue Wed Thu Fri Sat   New Dose 2 mg 4 mg 2 mg 2 mg 4 mg 2 mg 4 mg    Description       Take 2 2mg  tablets today and tomorrow then 2mg  everyday except 4mg  on Monday Thursday and saturday     Recheck in 2 weeks

## 2013-01-29 LAB — CMP14+EGFR
A/G RATIO: 2 (ref 1.1–2.5)
ALBUMIN: 4.4 g/dL (ref 3.5–4.7)
ALK PHOS: 38 IU/L — AB (ref 39–117)
ALT: 10 IU/L (ref 0–32)
AST: 19 IU/L (ref 0–40)
BILIRUBIN TOTAL: 0.3 mg/dL (ref 0.0–1.2)
BUN/Creatinine Ratio: 13 (ref 11–26)
BUN: 15 mg/dL (ref 8–27)
CO2: 22 mmol/L (ref 18–29)
CREATININE: 1.2 mg/dL — AB (ref 0.57–1.00)
Calcium: 9.2 mg/dL (ref 8.7–10.3)
Chloride: 101 mmol/L (ref 97–108)
GFR calc non Af Amer: 40 mL/min/{1.73_m2} — ABNORMAL LOW (ref >59)
GFR, EST AFRICAN AMERICAN: 47 mL/min/{1.73_m2} — AB (ref >59)
GLOBULIN, TOTAL: 2.2 g/dL (ref 1.5–4.5)
Glucose: 120 mg/dL — ABNORMAL HIGH (ref 65–99)
Potassium: 4.1 mmol/L (ref 3.5–5.2)
SODIUM: 139 mmol/L (ref 134–144)
Total Protein: 6.6 g/dL (ref 6.0–8.5)

## 2013-01-29 LAB — NMR, LIPOPROFILE
Cholesterol: 209 mg/dL — ABNORMAL HIGH (ref <200)
HDL Cholesterol by NMR: 68 mg/dL (ref >=40)
HDL PARTICLE NUMBER: 39.2 umol/L (ref >=30.5)
LDL Particle Number: 1411 nmol/L — ABNORMAL HIGH (ref <1000)
LDL SIZE: 21.1 nm (ref >20.5)
LDLC SERPL CALC-MCNC: 113 mg/dL — ABNORMAL HIGH (ref <100)
LP-IR SCORE: 30 (ref <=45)
Small LDL Particle Number: 508 nmol/L (ref <=527)
Triglycerides by NMR: 141 mg/dL (ref <150)

## 2013-01-30 ENCOUNTER — Telehealth: Payer: Self-pay | Admitting: *Deleted

## 2013-01-30 ENCOUNTER — Other Ambulatory Visit: Payer: Self-pay | Admitting: Family Medicine

## 2013-01-30 ENCOUNTER — Other Ambulatory Visit: Payer: Self-pay | Admitting: Nurse Practitioner

## 2013-01-30 ENCOUNTER — Telehealth: Payer: Self-pay | Admitting: Nurse Practitioner

## 2013-01-30 MED ORDER — WARFARIN SODIUM 2 MG PO TABS: 2.0000 mg | ORAL_TABLET | Freq: Every day | ORAL | Status: DC

## 2013-01-30 MED ORDER — SIMVASTATIN 40 MG PO TABS: 40.0000 mg | ORAL_TABLET | Freq: Every day | ORAL | Status: DC

## 2013-01-30 NOTE — Telephone Encounter (Signed)
Patient aware please call in blood thinner for 3 mths

## 2013-01-30 NOTE — Telephone Encounter (Signed)
Message copied by Shelbie Ammons on Fri Jan 30, 2013 12:43 PM ------      Message from: Chevis Pretty      Created: Fri Jan 30, 2013  8:27 AM       Kidney function worsening- creatine increasing- avoid all NsAidS      LDL are elevated- need to be on low dose statin- zocor rx sent to pharmacy- one tablet daily- low fat diet and exercise and recheck in 3 months- rx sent to pharmacy       ------

## 2013-01-30 NOTE — Telephone Encounter (Signed)
Aware of lab results and cholesterol medication at pharmacy.

## 2013-02-03 ENCOUNTER — Other Ambulatory Visit: Payer: Self-pay | Admitting: Pharmacist

## 2013-02-03 DIAGNOSIS — M810 Age-related osteoporosis without current pathological fracture: Secondary | ICD-10-CM

## 2013-02-03 MED ORDER — DENOSUMAB 60 MG/ML ~~LOC~~ SOLN: 60.0000 mg | SUBCUTANEOUS | Status: DC

## 2013-02-03 NOTE — Telephone Encounter (Signed)
Prolia Rx called to [facility].  Patinet notified and will bring to appt 02/12/13 for injection.  She is instructed to keep in refrig until she comes to appt.

## 2013-02-10 ENCOUNTER — Telehealth: Payer: Self-pay | Admitting: Nurse Practitioner

## 2013-02-10 MED ORDER — WARFARIN SODIUM 2 MG PO TABS: 2.0000 mg | ORAL_TABLET | Freq: Every day | ORAL | Status: DC

## 2013-02-10 NOTE — Telephone Encounter (Signed)
Per medication records Rx was sent electronically on 02/03/13 and was confirmed that [facility] received.  Since [facility] has not record [facility] with instructions to call me if PA needed.  I will need her insurance PA phone number if they is the case.  When I called to notify patient of above, she states that she has not received the warfarin sent in 01/30/13. Resent warfarin electronically

## 2013-02-11 NOTE — Telephone Encounter (Signed)
Spoke with pharmacist at [facility] has been filled for copay to patient of $6.  Patient notified and she will pick up prior to tomorrow's appt.

## 2013-02-12 ENCOUNTER — Ambulatory Visit (INDEPENDENT_AMBULATORY_CARE_PROVIDER_SITE_OTHER): Payer: Medicare Other | Admitting: Pharmacist

## 2013-02-12 DIAGNOSIS — I4891 Unspecified atrial fibrillation: Secondary | ICD-10-CM

## 2013-02-12 DIAGNOSIS — M810 Age-related osteoporosis without current pathological fracture: Secondary | ICD-10-CM

## 2013-02-12 DIAGNOSIS — Z7901 Long term (current) use of anticoagulants: Secondary | ICD-10-CM

## 2013-02-12 LAB — POCT INR: INR: 2.4

## 2013-02-12 MED ORDER — WARFARIN SODIUM 2 MG PO TABS: 2.0000 mg | ORAL_TABLET | Freq: Every day | ORAL | Status: DC

## 2013-02-12 MED ORDER — DENOSUMAB 60 MG/ML ~~LOC~~ SOLN
60.0000 mg | Freq: Once | SUBCUTANEOUS | Status: AC
  Administered 2013-02-12: 60 mg via SUBCUTANEOUS

## 2013-02-12 NOTE — Patient Instructions (Signed)
Anticoagulation Dose Instructions as of 02/12/2013     Candace [name] Tue Wed Thu Fri Sat   New Dose 2 mg 4 mg 2 mg 2 mg 4 mg 2 mg 4 mg    Description       Continue to take 2 tablets (=4mg ) on mondays, thursdays and saturdays.  Take 1 tablet (=2mg ) all other days.      INR was 2.4 today

## 2013-03-12 ENCOUNTER — Ambulatory Visit (INDEPENDENT_AMBULATORY_CARE_PROVIDER_SITE_OTHER): Payer: Medicare Other | Admitting: Pharmacist

## 2013-03-12 DIAGNOSIS — Z7901 Long term (current) use of anticoagulants: Secondary | ICD-10-CM

## 2013-03-12 DIAGNOSIS — I4891 Unspecified atrial fibrillation: Secondary | ICD-10-CM

## 2013-03-12 LAB — POCT INR: INR: 2.5

## 2013-03-12 NOTE — Patient Instructions (Signed)
Anticoagulation Dose Instructions as of 03/12/2013     Candace [name] Tue Wed Thu Fri Sat   New Dose 2 mg 4 mg 2 mg 2 mg 4 mg 2 mg 4 mg    Description       Continue to take 2 tablets (=4mg ) on mondays, thursdays and saturdays.  Take 1 tablet (=2mg ) all other days.      INR was 2.5 today

## 2013-03-24 ENCOUNTER — Telehealth: Payer: Self-pay | Admitting: Nurse Practitioner

## 2013-03-25 ENCOUNTER — Other Ambulatory Visit: Payer: Self-pay | Admitting: *Deleted

## 2013-03-25 MED ORDER — LISINOPRIL 40 MG PO TABS
40.0000 mg | ORAL_TABLET | Freq: Every day | ORAL | Status: DC
Start: 2013-03-25 — End: 2013-05-15

## 2013-03-26 ENCOUNTER — Other Ambulatory Visit: Payer: Self-pay | Admitting: *Deleted

## 2013-03-26 MED ORDER — AMLODIPINE BESYLATE 5 MG PO TABS: 5.0000 mg | ORAL_TABLET | Freq: Every day | ORAL | Status: DC

## 2013-03-26 NOTE — Telephone Encounter (Signed)
Patient NTBS for follow up and lab work  

## 2013-04-16 ENCOUNTER — Ambulatory Visit (INDEPENDENT_AMBULATORY_CARE_PROVIDER_SITE_OTHER): Payer: Medicare Other | Admitting: Pharmacist

## 2013-04-16 DIAGNOSIS — I4891 Unspecified atrial fibrillation: Secondary | ICD-10-CM

## 2013-04-16 DIAGNOSIS — Z7901 Long term (current) use of anticoagulants: Secondary | ICD-10-CM

## 2013-04-16 DIAGNOSIS — I82409 Acute embolism and thrombosis of unspecified deep veins of unspecified lower extremity: Secondary | ICD-10-CM

## 2013-04-16 DIAGNOSIS — I82401 Acute embolism and thrombosis of unspecified deep veins of right lower extremity: Secondary | ICD-10-CM

## 2013-04-16 LAB — POCT INR: INR: 3.1

## 2013-04-16 NOTE — Patient Instructions (Signed)
Anticoagulation Dose Instructions as of 04/16/2013     Candace [name] Tue Wed Thu Fri Sat   New Dose 2 mg 4 mg 2 mg 2 mg 4 mg 2 mg 4 mg    Description       No warfarin today (thursday 04/16/2013) then continue to take 2 tablets (=4mg ) on mondays, thursdays and saturdays.  Take 1 tablet (=2mg ) all other days.      INR was 3.1 today

## 2013-04-29 ENCOUNTER — Ambulatory Visit: Payer: Medicare Other | Admitting: Nurse Practitioner

## 2013-04-29 NOTE — Progress Notes (Signed)
INR recheck changed to 05/15/13 because patient cannot come in sooner - transportation issues.

## 2013-05-15 ENCOUNTER — Encounter: Payer: Self-pay | Admitting: Nurse Practitioner

## 2013-05-15 ENCOUNTER — Ambulatory Visit (INDEPENDENT_AMBULATORY_CARE_PROVIDER_SITE_OTHER): Payer: Medicare Other | Admitting: Nurse Practitioner

## 2013-05-15 VITALS — BP 145/87 | HR 87 | Temp 98.4°F | Ht <= 58 in | Wt 92.0 lb

## 2013-05-15 DIAGNOSIS — M810 Age-related osteoporosis without current pathological fracture: Secondary | ICD-10-CM

## 2013-05-15 DIAGNOSIS — I82401 Acute embolism and thrombosis of unspecified deep veins of right lower extremity: Secondary | ICD-10-CM

## 2013-05-15 DIAGNOSIS — I4891 Unspecified atrial fibrillation: Secondary | ICD-10-CM

## 2013-05-15 DIAGNOSIS — Z7901 Long term (current) use of anticoagulants: Secondary | ICD-10-CM

## 2013-05-15 DIAGNOSIS — I10 Essential (primary) hypertension: Secondary | ICD-10-CM

## 2013-05-15 DIAGNOSIS — I82409 Acute embolism and thrombosis of unspecified deep veins of unspecified lower extremity: Secondary | ICD-10-CM

## 2013-05-15 LAB — POCT INR: INR: 2.1

## 2013-05-15 MED ORDER — AMLODIPINE BESYLATE 5 MG PO TABS: 5.0000 mg | ORAL_TABLET | Freq: Every day | ORAL | Status: DC

## 2013-05-15 MED ORDER — FUROSEMIDE 20 MG PO TABS: ORAL_TABLET | ORAL | Status: DC

## 2013-05-15 MED ORDER — METOPROLOL TARTRATE 25 MG PO TABS: ORAL_TABLET | ORAL | Status: DC

## 2013-05-15 MED ORDER — SIMVASTATIN 40 MG PO TABS: 40.0000 mg | ORAL_TABLET | Freq: Every day | ORAL | Status: DC

## 2013-05-15 MED ORDER — WARFARIN SODIUM 2 MG PO TABS: 2.0000 mg | ORAL_TABLET | Freq: Every day | ORAL | Status: DC

## 2013-05-15 MED ORDER — LISINOPRIL 40 MG PO TABS: 40.0000 mg | ORAL_TABLET | Freq: Every day | ORAL | Status: DC

## 2013-05-15 NOTE — Progress Notes (Signed)
Subjective:    Patient ID: Candace [name], female    DOB: [birth year], 80 y.o.   MRN: [id number]  Patient here today for follow up of chronic medical problems.   Hypertension This is a chronic problem. The current episode started more than 1 year ago. The problem is unchanged. The problem is controlled. Pertinent negatives include no anxiety, chest pain, headaches, neck pain, palpitations, peripheral edema or shortness of breath. There are no associated agents to hypertension. Risk factors for coronary artery disease include family history and post-menopausal state. Past treatments include diuretics, ACE inhibitors and calcium channel blockers. The current treatment provides moderate improvement. There are no compliance problems.   Atrial fib/anticoag therapy Coumadin and metoprolol - working well- INR checked today and meds adjusted- no c/o heart racing osteoporosis prolia- no side effects   Review of Systems  Respiratory: Negative for shortness of breath.   Cardiovascular: Negative for chest pain and palpitations.  Musculoskeletal: Negative for neck pain.  Neurological: Negative for headaches.       Objective:   Physical Exam  Constitutional: She is oriented to person, place, and time. She appears well-developed and well-nourished.  HENT:  Nose: Nose normal.  Mouth/Throat: Oropharynx is clear and moist.  Eyes: EOM are normal.  Neck: Trachea normal, normal range of motion and full passive range of motion without pain. Neck supple. No JVD present. Carotid bruit is not present. No thyromegaly present.  Cardiovascular: Normal rate, regular rhythm and intact distal pulses.  Exam reveals no gallop and no friction rub.   Murmur (2/6 systolic) heard. Pulmonary/Chest: Effort normal and breath sounds normal.  Abdominal: Soft. Bowel sounds are normal. She exhibits no distension and no mass. There is no tenderness.  Musculoskeletal: Normal range of motion.  Lymphadenopathy:    She has  no cervical adenopathy.  Neurological: She is alert and oriented to person, place, and time. She has normal reflexes.  Skin: Skin is warm and dry.  Psychiatric: She has a normal mood and affect. Her behavior is normal. Judgment and thought content normal.    BP 145/87  Pulse 87  Temp(Src) 98.4 F (36.9 C) (Oral)  Ht 4' 5"  (1.346 m)  Wt 92 lb (41.731 kg)  BMI 23.03 kg/m2        Assessment & Plan:   1. DVT (deep venous thrombosis), right   2. Long term (current) use of anticoagulants   3. Atrial fibrillation   4. HTN (hypertension)   5. Osteoporosis    Orders Placed This Encounter  Procedures  . CMP14+EGFR  . NMR, lipoprofile   Meds ordered this encounter  Medications  . amLODipine (NORVASC) 5 MG tablet    Sig: Take 1 tablet (5 mg total) by mouth daily.    Dispense:  90 tablet    Refill:  1    Order Specific Question:  Supervising Provider    Answer:  Chipper Herb [[id number]]  . furosemide (LASIX) 20 MG tablet    Sig: Take 1/2 tablet by mouth in the morning with a banana    Dispense:  90 tablet    Refill:  1    Order Specific Question:  Supervising Provider    Answer:  Chipper Herb [[id number]]  . lisinopril (PRINIVIL,ZESTRIL) 40 MG tablet    Sig: Take 1 tablet (40 mg total) by mouth daily.    Dispense:  90 tablet    Refill:  1    Order Specific Question:  Supervising Provider    Answer:  Redge Gainer W [[id number]]  . metoprolol tartrate (LOPRESSOR) 25 MG tablet    Sig: 1/2 po BID    Dispense:  90 tablet    Refill:  1    Order Specific Question:  Supervising Provider    Answer:  Chipper Herb [[id number]]  . simvastatin (ZOCOR) 40 MG tablet    Sig: Take 1 tablet (40 mg total) by mouth at bedtime.    Dispense:  90 tablet    Refill:  1    Order Specific Question:  Supervising Provider    Answer:  Chipper Herb [[id number]]  . warfarin (COUMADIN) 2 MG tablet    Sig: Take 1 tablet (2 mg total) by mouth daily. Take 1 or 2 tablets daily as instructed by anticoagulation     Dispense:  90 tablet    Refill:  1    Please disregard previous rx - dose for warfarin has been increased recently.    Order Specific Question:  Supervising Provider    Answer:  Chipper Herb [[id number]]    Discussed INR results Labs pending Health maintenance reviewed Diet and exercise encouraged Continue all meds Follow up  In 3 months   [place], FNP

## 2013-05-15 NOTE — Patient Instructions (Signed)
Health Maintenance, Female A healthy lifestyle and preventative care can promote health and wellness.  Maintain regular health, dental, and eye exams.  Eat a healthy diet. Foods like vegetables, fruits, whole grains, low-fat dairy products, and lean protein foods contain the nutrients you need without too many calories. Decrease your intake of foods high in solid fats, added sugars, and salt. Get information about a proper diet from your caregiver, if necessary.  Regular physical exercise is one of the most important things you can do for your health. Most adults should get at least 150 minutes of moderate-intensity exercise (any activity that increases your heart rate and causes you to sweat) each week. In addition, most adults need muscle-strengthening exercises on 2 or more days a week.   Maintain a healthy weight. The body mass index (BMI) is a screening tool to identify possible weight problems. It provides an estimate of body fat based on height and weight. Your caregiver can help determine your BMI, and can help you achieve or maintain a healthy weight. For adults 20 years and older:  A BMI below 18.5 is considered underweight.  A BMI of 18.5 to 24.9 is normal.  A BMI of 25 to 29.9 is considered overweight.  A BMI of 30 and above is considered obese.  Maintain normal blood lipids and cholesterol by exercising and minimizing your intake of saturated fat. Eat a balanced diet with plenty of fruits and vegetables. Blood tests for lipids and cholesterol should begin at age 37 and be repeated every 5 years. If your lipid or cholesterol levels are high, you are over 50, or you are a high risk for heart disease, you may need your cholesterol levels checked more frequently.Ongoing high lipid and cholesterol levels should be treated with medicines if diet and exercise are not effective.  If you smoke, find out from your caregiver how to quit. If you do not use tobacco, do not start.  Lung  cancer screening is recommended for adults aged 12 80 years who are at high risk for developing lung cancer because of a history of smoking. Yearly low-dose computed tomography (CT) is recommended for people who have at least a 30-pack-year history of smoking and are a current smoker or have quit within the past 15 years. A pack year of smoking is smoking an average of 1 pack of cigarettes a day for 1 year (for example: 1 pack a day for 30 years or 2 packs a day for 15 years). Yearly screening should continue until the smoker has stopped smoking for at least 15 years. Yearly screening should also be stopped for people who develop a health problem that would prevent them from having lung cancer treatment.  If you are pregnant, do not drink alcohol. If you are breastfeeding, be very cautious about drinking alcohol. If you are not pregnant and choose to drink alcohol, do not exceed 1 drink per day. One drink is considered to be 12 ounces (355 mL) of beer, 5 ounces (148 mL) of wine, or 1.5 ounces (44 mL) of liquor.  Avoid use of street drugs. Do not share needles with anyone. Ask for help if you need support or instructions about stopping the use of drugs.  High blood pressure causes heart disease and increases the risk of stroke. Blood pressure should be checked at least every 1 to 2 years. Ongoing high blood pressure should be treated with medicines, if weight loss and exercise are not effective.  If you are 55 to  78 years old, ask your caregiver if you should take aspirin to prevent strokes.  Diabetes screening involves taking a blood sample to check your fasting blood sugar level. This should be done once every 3 years, after age 74, if you are within normal weight and without risk factors for diabetes. Testing should be considered at a younger age or be carried out more frequently if you are overweight and have at least 1 risk factor for diabetes.  Breast cancer screening is essential preventative care  for women. You should practice "breast self-awareness." This means understanding the normal appearance and feel of your breasts and may include breast self-examination. Any changes detected, no matter how small, should be reported to a caregiver. Women in their 67s and 30s should have a clinical breast exam (CBE) by a caregiver as part of a regular health exam every 1 to 3 years. After age 31, women should have a CBE every year. Starting at age 49, women should consider having a mammogram (breast X-ray) every year. Women who have a family history of breast cancer should talk to their caregiver about genetic screening. Women at a high risk of breast cancer should talk to their caregiver about having an MRI and a mammogram every year.  Breast cancer gene (BRCA)-related cancer risk assessment is recommended for women who have family members with BRCA-related cancers. BRCA-related cancers include breast, ovarian, tubal, and peritoneal cancers. Having family members with these cancers may be associated with an increased risk for harmful changes (mutations) in the breast cancer genes BRCA1 and BRCA2. Results of the assessment will determine the need for genetic counseling and BRCA1 and BRCA2 testing.  The Pap test is a screening test for cervical cancer. Women should have a Pap test starting at age 66. Between ages 80 and 37, Pap tests should be repeated every 2 years. Beginning at age 75, you should have a Pap test every 3 years as long as the past 3 Pap tests have been normal. If you had a hysterectomy for a problem that was not cancer or a condition that could lead to cancer, then you no longer need Pap tests. If you are between ages 18 and 34, and you have had normal Pap tests going back 10 years, you no longer need Pap tests. If you have had past treatment for cervical cancer or a condition that could lead to cancer, you need Pap tests and screening for cancer for at least 20 years after your treatment. If Pap  tests have been discontinued, risk factors (such as a new sexual partner) need to be reassessed to determine if screening should be resumed. Some women have medical problems that increase the chance of getting cervical cancer. In these cases, your caregiver may recommend more frequent screening and Pap tests.  The human papillomavirus (HPV) test is an additional test that may be used for cervical cancer screening. The HPV test looks for the virus that can cause the cell changes on the cervix. The cells collected during the Pap test can be tested for HPV. The HPV test could be used to screen women aged 73 years and older, and should be used in women of any age who have unclear Pap test results. After the age of 30, women should have HPV testing at the same frequency as a Pap test.  Colorectal cancer can be detected and often prevented. Most routine colorectal cancer screening begins at the age of 30 and continues through age 52. However, your caregiver  may recommend screening at an earlier age if you have risk factors for colon cancer. On a yearly basis, your caregiver may provide home test kits to check for hidden blood in the stool. Use of a small camera at the end of a tube, to directly examine the colon (sigmoidoscopy or colonoscopy), can detect the earliest forms of colorectal cancer. Talk to your caregiver about this at age 56, when routine screening begins. Direct examination of the colon should be repeated every 5 to 10 years through age 69, unless early forms of pre-cancerous polyps or small growths are found.  Hepatitis C blood testing is recommended for all people born from [birth year] through 1963 and any individual with known risks for hepatitis C.  Practice safe sex. Use condoms and avoid high-risk sexual practices to reduce the spread of sexually transmitted infections (STIs). Sexually active women aged 60 and younger should be checked for Chlamydia, which is a common sexually transmitted infection.  Older women with new or multiple partners should also be tested for Chlamydia. Testing for other STIs is recommended if you are sexually active and at increased risk.  Osteoporosis is a disease in which the bones lose minerals and strength with aging. This can result in serious bone fractures. The risk of osteoporosis can be identified using a bone density scan. Women ages 28 and over and women at risk for fractures or osteoporosis should discuss screening with their caregivers. Ask your caregiver whether you should be taking a calcium supplement or vitamin D to reduce the rate of osteoporosis.  Menopause can be associated with physical symptoms and risks. Hormone replacement therapy is available to decrease symptoms and risks. You should talk to your caregiver about whether hormone replacement therapy is right for you.  Use sunscreen. Apply sunscreen liberally and repeatedly throughout the day. You should seek shade when your shadow is shorter than you. Protect yourself by wearing long sleeves, pants, a wide-brimmed hat, and sunglasses year round, whenever you are outdoors.  Notify your caregiver of new moles or changes in moles, especially if there is a change in shape or color. Also notify your caregiver if a mole is larger than the size of a pencil eraser.  Stay current with your immunizations. Document Released: 07/10/2010 Document Revised: 04/21/2012 Document Reviewed: 07/10/2010 [facility] Patient Information 2014 [employer].

## 2013-05-16 LAB — CMP14+EGFR
ALT: 11 IU/L (ref 0–32)
AST: 16 IU/L (ref 0–40)
Albumin/Globulin Ratio: 2.2 (ref 1.1–2.5)
Albumin: 4.2 g/dL (ref 3.5–4.7)
Alkaline Phosphatase: 36 IU/L — ABNORMAL LOW (ref 39–117)
BILIRUBIN TOTAL: 0.3 mg/dL (ref 0.0–1.2)
BUN / CREAT RATIO: 15 (ref 11–26)
BUN: 21 mg/dL (ref 8–27)
CALCIUM: 8.9 mg/dL (ref 8.7–10.3)
CO2: 22 mmol/L (ref 18–29)
Chloride: 103 mmol/L (ref 97–108)
Creatinine, Ser: 1.41 mg/dL — ABNORMAL HIGH (ref 0.57–1.00)
GFR, EST AFRICAN AMERICAN: 38 mL/min/{1.73_m2} — AB (ref >59)
GFR, EST NON AFRICAN AMERICAN: 33 mL/min/{1.73_m2} — AB (ref >59)
GLOBULIN, TOTAL: 1.9 g/dL (ref 1.5–4.5)
Glucose: 102 mg/dL — ABNORMAL HIGH (ref 65–99)
Potassium: 4.4 mmol/L (ref 3.5–5.2)
SODIUM: 138 mmol/L (ref 134–144)
Total Protein: 6.1 g/dL (ref 6.0–8.5)

## 2013-05-16 LAB — NMR, LIPOPROFILE
Cholesterol: 150 mg/dL (ref <200)
HDL CHOLESTEROL BY NMR: 56 mg/dL (ref >=40)
HDL PARTICLE NUMBER: 32.5 umol/L (ref >=30.5)
LDL Particle Number: 798 nmol/L (ref <1000)
LDL SIZE: 20.6 nm (ref >20.5)
LDLC SERPL CALC-MCNC: 62 mg/dL (ref <100)
LP-IR Score: 54 — ABNORMAL HIGH (ref <=45)
Small LDL Particle Number: 321 nmol/L (ref <=527)
Triglycerides by NMR: 161 mg/dL — ABNORMAL HIGH (ref <150)

## 2013-05-19 ENCOUNTER — Telehealth: Payer: Self-pay | Admitting: Nurse Practitioner

## 2013-05-19 ENCOUNTER — Telehealth: Payer: Self-pay | Admitting: Family Medicine

## 2013-05-19 NOTE — Telephone Encounter (Signed)
Patient aware.

## 2013-05-19 NOTE — Telephone Encounter (Signed)
Message copied by Cline Crock on Tue May 19, 2013 11:59 AM ------      Message from: Chevis Pretty      Created: Mon May 18, 2013  1:10 PM       Kidney and liver function stable- but creatine gradually increasing- avoid ALL NSAIDS      Cholesterol looks great      INR reviewed at appointment      Continue current meds- low fat diet and exercise and recheck in 3 months       ------

## 2013-05-19 NOTE — Telephone Encounter (Signed)
Message copied by Waverly Ferrari on Tue May 19, 2013  9:17 AM ------      Message from: Chevis Pretty      Created: Mon May 18, 2013  1:10 PM       Kidney and liver function stable- but creatine gradually increasing- avoid ALL NSAIDS      Cholesterol looks great      INR reviewed at appointment      Continue current meds- low fat diet and exercise and recheck in 3 months       ------

## 2013-05-21 ENCOUNTER — Telehealth: Payer: Self-pay | Admitting: Pharmacist

## 2013-05-21 NOTE — Telephone Encounter (Signed)
There is an appt for patient to have INR rechecked 06/04/13. Tried to call patient - left message for appt time

## 2013-06-04 ENCOUNTER — Ambulatory Visit (INDEPENDENT_AMBULATORY_CARE_PROVIDER_SITE_OTHER): Payer: Medicare Other | Admitting: Pharmacist

## 2013-06-04 DIAGNOSIS — I82401 Acute embolism and thrombosis of unspecified deep veins of right lower extremity: Secondary | ICD-10-CM

## 2013-06-04 DIAGNOSIS — Z7901 Long term (current) use of anticoagulants: Secondary | ICD-10-CM

## 2013-06-04 DIAGNOSIS — I4891 Unspecified atrial fibrillation: Secondary | ICD-10-CM

## 2013-06-04 LAB — POCT INR: INR: 2.2

## 2013-06-04 NOTE — Patient Instructions (Signed)
Anticoagulation Dose Instructions as of 06/04/2013     Candace [name] Tue Wed Thu Fri Sat   New Dose 2 mg 4 mg 2 mg 2 mg 4 mg 2 mg 4 mg    Description       Continue current dose of 2 tablets on Mondays, Thursdays and Saturdays and 1 tablet all other days.      INR was 2.2 today

## 2013-06-18 ENCOUNTER — Telehealth: Payer: Self-pay | Admitting: Nurse Practitioner

## 2013-06-18 ENCOUNTER — Ambulatory Visit (INDEPENDENT_AMBULATORY_CARE_PROVIDER_SITE_OTHER): Payer: Medicare Other | Admitting: Family Medicine

## 2013-06-18 ENCOUNTER — Encounter: Payer: Self-pay | Admitting: Family Medicine

## 2013-06-18 ENCOUNTER — Ambulatory Visit (INDEPENDENT_AMBULATORY_CARE_PROVIDER_SITE_OTHER): Payer: Medicare Other

## 2013-06-18 VITALS — BP 137/91 | HR 83 | Temp 96.9°F | Ht <= 58 in | Wt 90.0 lb

## 2013-06-18 DIAGNOSIS — M810 Age-related osteoporosis without current pathological fracture: Secondary | ICD-10-CM

## 2013-06-18 DIAGNOSIS — S22000A Wedge compression fracture of unspecified thoracic vertebra, initial encounter for closed fracture: Secondary | ICD-10-CM

## 2013-06-18 DIAGNOSIS — S22009A Unspecified fracture of unspecified thoracic vertebra, initial encounter for closed fracture: Secondary | ICD-10-CM

## 2013-06-18 DIAGNOSIS — M549 Dorsalgia, unspecified: Secondary | ICD-10-CM

## 2013-06-18 NOTE — Patient Instructions (Signed)
The patient and her family members should call us if the pain gets worse from this compression fracture and we will arrange another vertebral plasty Take Tylenol as needed for pain Continue to be careful and do not put yourself at risk for falls

## 2013-06-18 NOTE — Telephone Encounter (Signed)
appt schedule

## 2013-06-18 NOTE — Progress Notes (Signed)
   Subjective:    Patient ID: Candace [name], female    DOB: 1922-10-04, 80 y.o.   MRN: [id number]  HPI Patient comes in today accompanied by her granddaughter. She complains of worsening mid back pain for 4 days. No known injury and no cough. She has taken Tylenol with some relief. This patient has a history of vertebral fractures with previous vertebroplasty.   Review of Systems  Constitutional: Negative.   HENT: Negative.   Eyes: Negative.   Respiratory: Negative.   Cardiovascular: Negative.   Gastrointestinal: Negative.   Endocrine: Negative.   Genitourinary: Negative.  Vaginal bleeding: mid back pain.  Musculoskeletal: Positive for arthralgias.  Skin: Positive for color change (granddaughter noticed increase in bilateral lower extremity redness).  Allergic/Immunologic: Negative.   Neurological: Negative.   Hematological: Negative.   Psychiatric/Behavioral: Negative.        Objective:   Physical Exam  Nursing note and vitals reviewed. Constitutional: She is oriented to person, place, and time. No distress.  HENT:  Head: Normocephalic and atraumatic.  Eyes: Conjunctivae and EOM are normal. Pupils are equal, round, and reactive to light. Right eye exhibits no discharge. Left eye exhibits no discharge. No scleral icterus.  Neck: Normal range of motion. Neck supple. No thyromegaly present.  Cardiovascular: Normal rate, regular rhythm and normal heart sounds.   No murmur heard. Pulmonary/Chest: Effort normal and breath sounds normal. No respiratory distress. She has no wheezes. She has no rales.  Musculoskeletal: Normal range of motion. She exhibits no edema.  Severe kyphosis  Lymphadenopathy:    She has no cervical adenopathy.  Neurological: She is alert and oriented to person, place, and time.  Skin: Skin is warm and dry. No rash noted.  Psychiatric: She has a normal mood and affect. Her behavior is normal. Judgment and thought content normal.   BP 137/91  Pulse 83   Temp(Src) 96.9 F (36.1 C) (Oral)  Ht 4\' 5"  (1.346 m)  Wt 90 lb (40.824 kg)  BMI 22.53 kg/m2  SpO2 97% [facility] reading (PRIMARY) by  Dr. Glendon Axe spine severe osteoporosis with compression fracture at about T6.                                  The patient and her granddaughter were informed of this x-ray report       Assessment & Plan:  1. Back pain - DG Thoracic Spine 2 View; Future  2. Osteoporosis  3. Compression fracture of thoracic vertebra Patient Instructions  The patient and her family members should call us if the pain gets worse from this compression fracture and we will arrange another vertebral plasty Take Tylenol as needed for pain Continue to be careful and do not put yourself at risk for falls   Arrie Senate MD

## 2013-06-25 ENCOUNTER — Telehealth: Payer: Self-pay | Admitting: Family Medicine

## 2013-07-06 ENCOUNTER — Telehealth: Payer: Self-pay

## 2013-07-06 DIAGNOSIS — IMO0002 Reserved for concepts with insufficient information to code with codable children: Secondary | ICD-10-CM

## 2013-07-06 NOTE — Telephone Encounter (Signed)
Patient said she was supposed to be referred to a back doctor

## 2013-07-06 NOTE — Telephone Encounter (Signed)
Please let this patient and her family know the status of her referral to the radiologist for the possible vertebroplasty.

## 2013-07-07 NOTE — Telephone Encounter (Signed)
Referral placed and pt aware this to be done ASAP!

## 2013-07-08 ENCOUNTER — Other Ambulatory Visit (HOSPITAL_COMMUNITY): Payer: Self-pay | Admitting: Interventional Radiology

## 2013-07-08 DIAGNOSIS — IMO0002 Reserved for concepts with insufficient information to code with codable children: Secondary | ICD-10-CM

## 2013-07-08 DIAGNOSIS — M549 Dorsalgia, unspecified: Secondary | ICD-10-CM

## 2013-07-13 ENCOUNTER — Encounter (HOSPITAL_COMMUNITY): Payer: Self-pay | Admitting: Emergency Medicine

## 2013-07-13 DIAGNOSIS — Z79899 Other long term (current) drug therapy: Secondary | ICD-10-CM | POA: Insufficient documentation

## 2013-07-13 DIAGNOSIS — E7800 Pure hypercholesterolemia, unspecified: Secondary | ICD-10-CM | POA: Insufficient documentation

## 2013-07-13 DIAGNOSIS — Z9889 Other specified postprocedural states: Secondary | ICD-10-CM | POA: Insufficient documentation

## 2013-07-13 DIAGNOSIS — Z86711 Personal history of pulmonary embolism: Secondary | ICD-10-CM | POA: Insufficient documentation

## 2013-07-13 DIAGNOSIS — Z7901 Long term (current) use of anticoagulants: Secondary | ICD-10-CM | POA: Insufficient documentation

## 2013-07-13 DIAGNOSIS — I10 Essential (primary) hypertension: Secondary | ICD-10-CM | POA: Insufficient documentation

## 2013-07-13 DIAGNOSIS — Z86718 Personal history of other venous thrombosis and embolism: Secondary | ICD-10-CM | POA: Insufficient documentation

## 2013-07-13 DIAGNOSIS — I4891 Unspecified atrial fibrillation: Secondary | ICD-10-CM | POA: Insufficient documentation

## 2013-07-13 DIAGNOSIS — M546 Pain in thoracic spine: Secondary | ICD-10-CM | POA: Insufficient documentation

## 2013-07-13 NOTE — ED Notes (Signed)
Patient here from home with complaint of mid-lower back pain. Explains that the pain started a long time ago. Was diagnosed by PCP with a compression fracture in her spine on June 11. Was supposed to have an MRI done to determine course of action, but was never contacted. Presents tonight because pain has become unbearable.

## 2013-07-14 ENCOUNTER — Other Ambulatory Visit (HOSPITAL_COMMUNITY): Payer: Self-pay | Admitting: Interventional Radiology

## 2013-07-14 ENCOUNTER — Emergency Department (HOSPITAL_COMMUNITY)
Admission: EM | Admit: 2013-07-14 | Discharge: 2013-07-14 | Disposition: A | Discharge status: Home / Self Care | Payer: Medicare Other | Attending: Emergency Medicine | Admitting: Emergency Medicine

## 2013-07-14 ENCOUNTER — Ambulatory Visit (HOSPITAL_COMMUNITY)
Admission: RE | Admit: 2013-07-14 | Discharge: 2013-07-14 | Disposition: A | Discharge status: Home / Self Care | Payer: Medicare Other | Source: Ambulatory Visit | Attending: Interventional Radiology | Admitting: Interventional Radiology

## 2013-07-14 DIAGNOSIS — M549 Dorsalgia, unspecified: Secondary | ICD-10-CM

## 2013-07-14 DIAGNOSIS — T148XXA Other injury of unspecified body region, initial encounter: Secondary | ICD-10-CM

## 2013-07-14 DIAGNOSIS — IMO0002 Reserved for concepts with insufficient information to code with codable children: Secondary | ICD-10-CM | POA: Insufficient documentation

## 2013-07-14 DIAGNOSIS — M546 Pain in thoracic spine: Secondary | ICD-10-CM | POA: Insufficient documentation

## 2013-07-14 DIAGNOSIS — M4000 Postural kyphosis, site unspecified: Secondary | ICD-10-CM | POA: Insufficient documentation

## 2013-07-14 NOTE — ED Provider Notes (Signed)
CSN: [id number]     Arrival date & time 07/13/13  2200 History   First MD Initiated Contact with Patient 07/14/13 0053     Chief Complaint  Patient presents with  . Back Pain     (Consider location/radiation/quality/duration/timing/severity/associated sxs/prior Treatment) HPI Patient states she has a history of ongoing thoracic back pain do to compression fractures. She has ongoing pain which she states is unchanged. She takes Tylenol for this pain do to multiple allergies. She is supposed to followup with her interventional radiologist who has done her vertebroplasty's in the past but has been unable to do so. She is asking for the name of the interventional radiology. She states her pain is currently improved. She's having no shortness of breath or chest pain. She has no focal weakness or numbness. She denies any fevers or chills. Past Medical History  Diagnosis Date  . Hypertension   . DVT (deep venous thrombosis)   . PE (pulmonary embolism)   . Hypercholesterolemia   . Atrial fibrillation    Past Surgical History  Procedure Laterality Date  . Cholecystectomy    . Kyphoplasty     Family History  Problem Relation Age of Onset  . Stroke Father    History  Substance Use Topics  . Smoking status: Never Smoker   . Smokeless tobacco: Current User    Types: Snuff  . Alcohol Use: No   OB History   Grav Para Term Preterm Abortions TAB SAB Ect Mult Living                 Review of Systems  Constitutional: Negative for fever and chills.  Respiratory: Negative for cough and shortness of breath.   Cardiovascular: Negative for chest pain, palpitations and leg swelling.  Gastrointestinal: Negative for nausea, vomiting and abdominal pain.  Musculoskeletal: Positive for back pain. Negative for myalgias, neck pain and neck stiffness.  Skin: Negative for rash and wound.  Neurological: Negative for dizziness, weakness, light-headedness, numbness and headaches.  All other systems  reviewed and are negative.     Allergies  Codeine; Tramadol; Fosamax; Sulfa antibiotics; Boniva; Septra; and Vioxx  Home Medications   Prior to Admission medications   Medication Sig Start Date End Date Taking? Authorizing Provider  acetaminophen (TYLENOL) 500 MG tablet Take 500 mg by mouth as needed for pain.   Yes Historical Provider, MD  bimatoprost (LUMIGAN) 0.01 % SOLN Place 1 drop into both eyes at bedtime.   Yes Historical Provider, MD  brimonidine (ALPHAGAN P) 0.1 % SOLN Apply 1 drop to eye 2 (two) times daily.    Yes Historical Provider, MD  denosumab (PROLIA) 60 MG/ML SOLN injection Inject 60 mg into the skin every 6 (six) months. Bring to office for administration 02/03/13  Yes Tammy Eckard, PHARMD  furosemide (LASIX) 20 MG tablet Take 10 mg by mouth daily. Takes 1/2 tab with banana in mornings   Yes Historical Provider, MD  lisinopril (PRINIVIL,ZESTRIL) 40 MG tablet Take 1 tablet (40 mg total) by mouth daily. 05/15/13  Yes [name] Done, FNP  metoprolol tartrate (LOPRESSOR) 25 MG tablet Take 12.5 mg by mouth 2 (two) times daily.   Yes Historical Provider, MD  simvastatin (ZOCOR) 40 MG tablet Take 1 tablet (40 mg total) by mouth at bedtime. 05/15/13  Yes [name] Done, FNP  warfarin (COUMADIN) 2 MG tablet Take 1 tablet (2 mg total) by mouth daily. Take 1 or 2 tablets daily as instructed by anticoagulation 05/15/13  Yes [name] Done, FNP  BP 128/69  Pulse 75  Temp(Src) 97.9 F (36.6 C) (Oral)  Resp 14  Ht 4\' 5"  (1.346 m)  Wt 92 lb (41.731 kg)  BMI 23.03 kg/m2  SpO2 100% Physical Exam  Nursing note and vitals reviewed. Constitutional: She is oriented to person, place, and time. She appears well-developed and well-nourished. No distress.  Well-appearing and in no distress.  HENT:  Head: Normocephalic and atraumatic.  Mouth/Throat: Oropharynx is clear and moist.  Eyes: EOM are normal. Pupils are equal, round, and reactive to light.  Neck: Normal range of  motion. Neck supple.  Cardiovascular: Normal rate and regular rhythm.   Pulmonary/Chest: Effort normal and breath sounds normal. No respiratory distress. She has no wheezes. She has no rales. She exhibits no tenderness.  Abdominal: Soft. Bowel sounds are normal. She exhibits no distension and no mass. There is no tenderness. There is no rebound and no guarding.  Musculoskeletal: Normal range of motion. She exhibits no edema and no tenderness.  Patient has no thoracic or lumbar midline tenderness. She has very mild tenderness over the left lower lateral ribs. There is no evidence of any trauma.  Neurological: She is alert and oriented to person, place, and time.  5/5 motor in all extremities. Sensation is intact.  Skin: Skin is warm and dry. No rash noted. No erythema.  Psychiatric: She has a normal mood and affect. Her behavior is normal.    ED Course  Procedures (including critical care time) Labs Review Labs Reviewed - No data to display  Imaging Review No results found.   EKG Interpretation None      MDM   Final diagnoses:  Thoracic back pain, unspecified back pain laterality   Patient is declining any further workup and is asking solely for the contact information of her interventional radiologist. She is refusing any pain medicines at this time stating that she feels much better. We'll discharge home with her daughter and have given return precautions.    Julianne Rice, MD 07/14/13 [phone]

## 2013-07-15 ENCOUNTER — Telehealth: Payer: Self-pay | Admitting: Family Medicine

## 2013-07-15 DIAGNOSIS — IMO0002 Reserved for concepts with insufficient information to code with codable children: Secondary | ICD-10-CM

## 2013-07-16 ENCOUNTER — Ambulatory Visit (INDEPENDENT_AMBULATORY_CARE_PROVIDER_SITE_OTHER): Payer: Medicare Other | Admitting: Pharmacist

## 2013-07-16 DIAGNOSIS — Z7901 Long term (current) use of anticoagulants: Secondary | ICD-10-CM

## 2013-07-16 DIAGNOSIS — I4891 Unspecified atrial fibrillation: Secondary | ICD-10-CM

## 2013-07-16 DIAGNOSIS — I82409 Acute embolism and thrombosis of unspecified deep veins of unspecified lower extremity: Secondary | ICD-10-CM

## 2013-07-16 DIAGNOSIS — I82401 Acute embolism and thrombosis of unspecified deep veins of right lower extremity: Secondary | ICD-10-CM

## 2013-07-16 LAB — POCT CBC
Granulocyte percent: 64.5 %G (ref 37–80)
HCT, POC: 40 % (ref 37.7–47.9)
HEMOGLOBIN: 12.8 g/dL (ref 12.2–16.2)
Lymph, poc: 1.7 (ref 0.6–3.4)
MCH: 27.8 pg (ref 27–31.2)
MCHC: 32 g/dL (ref 31.8–35.4)
MCV: 86.8 fL (ref 80–97)
MPV: 7.6 fL (ref 0–99.8)
POC Granulocyte: 4.1 (ref 2–6.9)
POC LYMPH %: 26.4 % (ref 10–50)
Platelet Count, POC: 253 10*3/uL (ref 142–424)
RBC: 4.6 M/uL (ref 4.04–5.48)
RDW, POC: 14.9 %
WBC: 6.4 10*3/uL (ref 4.6–10.2)

## 2013-07-16 LAB — POCT INR: INR: 5.6

## 2013-07-16 NOTE — Patient Instructions (Signed)
Anticoagulation Dose Instructions as of 07/16/2013     Candace [name] Tue Wed Thu Fri Sat   New Dose 2 mg 2 mg 2 mg 2 mg Hold Hold 2 mg   Alt Week 2 mg 2 mg 2 mg 2 mg 2 mg 2 mg 2 mg    Description       No warfarin today or tomorrow. Then decrease to 2mg  1 tablet daily      INR was 5.6 today

## 2013-07-17 ENCOUNTER — Other Ambulatory Visit: Payer: Self-pay | Admitting: Family Medicine

## 2013-07-17 DIAGNOSIS — M546 Pain in thoracic spine: Secondary | ICD-10-CM

## 2013-07-17 NOTE — Telephone Encounter (Signed)
Patient aware said this was already taken care of

## 2013-07-20 ENCOUNTER — Telehealth: Payer: Self-pay | Admitting: Family Medicine

## 2013-07-21 ENCOUNTER — Ambulatory Visit (INDEPENDENT_AMBULATORY_CARE_PROVIDER_SITE_OTHER): Payer: Medicare Other | Admitting: Pharmacist

## 2013-07-21 DIAGNOSIS — I82409 Acute embolism and thrombosis of unspecified deep veins of unspecified lower extremity: Secondary | ICD-10-CM

## 2013-07-21 DIAGNOSIS — I82401 Acute embolism and thrombosis of unspecified deep veins of right lower extremity: Secondary | ICD-10-CM

## 2013-07-21 DIAGNOSIS — I4891 Unspecified atrial fibrillation: Secondary | ICD-10-CM

## 2013-07-21 DIAGNOSIS — Z7901 Long term (current) use of anticoagulants: Secondary | ICD-10-CM

## 2013-07-21 DIAGNOSIS — I480 Paroxysmal atrial fibrillation: Secondary | ICD-10-CM

## 2013-07-21 LAB — POCT INR: INR: 1.2

## 2013-07-21 NOTE — Telephone Encounter (Signed)
Discussed results with patient. She stated understanding.

## 2013-07-21 NOTE — Patient Instructions (Signed)
Anticoagulation Dose Instructions as of 07/21/2013     Dorene Grebe Tue Wed Thu Fri Sat   New Dose Hold Hold Hold Hold 2 mg 2 mg 2 mg   Alt Week 2 mg 2 mg 2 mg 2 mg 2 mg 2 mg 2 mg    Description       Continue to hold warfarin.  May restart warfarin 2mg  once a day on Thursday, July 16th      INR was 1.2 today

## 2013-07-22 ENCOUNTER — Ambulatory Visit
Admission: RE | Admit: 2013-07-22 | Discharge: 2013-07-22 | Disposition: A | Discharge status: Home / Self Care | Payer: Medicare Other | Source: Ambulatory Visit | Attending: Family Medicine | Admitting: Family Medicine

## 2013-07-22 DIAGNOSIS — M546 Pain in thoracic spine: Secondary | ICD-10-CM

## 2013-07-22 MED ORDER — TRIAMCINOLONE ACETONIDE 40 MG/ML IJ SUSP (RADIOLOGY)
60.0000 mg | Freq: Once | INTRAMUSCULAR | Status: AC
  Administered 2013-07-22: 60 mg via EPIDURAL

## 2013-07-22 MED ORDER — IOHEXOL 300 MG/ML  SOLN
1.0000 mL | Freq: Once | INTRAMUSCULAR | Status: AC | PRN
  Administered 2013-07-22: 1 mL via EPIDURAL

## 2013-07-22 NOTE — Discharge Instructions (Signed)

## 2013-07-27 ENCOUNTER — Telehealth: Payer: Self-pay | Admitting: Nurse Practitioner

## 2013-07-27 NOTE — Telephone Encounter (Signed)
Patient recently had steroid epidural last week.. Recommend take 2 warfarin 2mg  tablets today (=4mg  total) then restart usually dose of 2mg  1 tablet daily.  Due recheck INR 07/30/13

## 2013-07-30 ENCOUNTER — Ambulatory Visit (INDEPENDENT_AMBULATORY_CARE_PROVIDER_SITE_OTHER): Payer: Medicare Other | Admitting: Pharmacist

## 2013-07-30 DIAGNOSIS — I480 Paroxysmal atrial fibrillation: Secondary | ICD-10-CM

## 2013-07-30 DIAGNOSIS — Z7901 Long term (current) use of anticoagulants: Secondary | ICD-10-CM

## 2013-07-30 DIAGNOSIS — I82401 Acute embolism and thrombosis of unspecified deep veins of right lower extremity: Secondary | ICD-10-CM

## 2013-07-30 DIAGNOSIS — M810 Age-related osteoporosis without current pathological fracture: Secondary | ICD-10-CM

## 2013-07-30 DIAGNOSIS — I4891 Unspecified atrial fibrillation: Secondary | ICD-10-CM

## 2013-07-30 DIAGNOSIS — I82409 Acute embolism and thrombosis of unspecified deep veins of unspecified lower extremity: Secondary | ICD-10-CM

## 2013-07-30 LAB — POCT INR: INR: 2.1

## 2013-07-30 MED ORDER — DENOSUMAB 60 MG/ML ~~LOC~~ SOLN: 60.0000 mg | SUBCUTANEOUS | Status: DC

## 2013-07-30 NOTE — Patient Instructions (Signed)
Anticoagulation Dose Instructions as of 07/30/2013     Candace [name] Tue Wed Thu Fri Sat   New Dose 2 mg 2 mg 2 mg 2 mg 2 mg 2 mg 2 mg    Description       Continue warfarin 2mg  once a day.        INR was 2.1 today

## 2013-08-03 ENCOUNTER — Other Ambulatory Visit: Payer: Self-pay | Admitting: Pharmacist

## 2013-08-03 DIAGNOSIS — M8080XD Other osteoporosis with current pathological fracture, unspecified site, subsequent encounter for fracture with routine healing: Secondary | ICD-10-CM

## 2013-08-13 ENCOUNTER — Ambulatory Visit (INDEPENDENT_AMBULATORY_CARE_PROVIDER_SITE_OTHER): Payer: Medicare Other | Admitting: Pharmacist

## 2013-08-13 DIAGNOSIS — Z7901 Long term (current) use of anticoagulants: Secondary | ICD-10-CM

## 2013-08-13 DIAGNOSIS — I480 Paroxysmal atrial fibrillation: Secondary | ICD-10-CM

## 2013-08-13 DIAGNOSIS — I82409 Acute embolism and thrombosis of unspecified deep veins of unspecified lower extremity: Secondary | ICD-10-CM

## 2013-08-13 DIAGNOSIS — I4891 Unspecified atrial fibrillation: Secondary | ICD-10-CM

## 2013-08-13 DIAGNOSIS — M810 Age-related osteoporosis without current pathological fracture: Secondary | ICD-10-CM

## 2013-08-13 DIAGNOSIS — I82401 Acute embolism and thrombosis of unspecified deep veins of right lower extremity: Secondary | ICD-10-CM

## 2013-08-13 LAB — POCT INR: INR: 2.9

## 2013-08-13 MED ORDER — FUROSEMIDE 20 MG PO TABS: 10.0000 mg | ORAL_TABLET | Freq: Every day | ORAL | Status: DC

## 2013-08-13 MED ORDER — DENOSUMAB 60 MG/ML ~~LOC~~ SOLN
60.0000 mg | Freq: Once | SUBCUTANEOUS | Status: AC
Start: 2013-08-13 — End: 2013-08-13
  Administered 2013-08-13: 60 mg via SUBCUTANEOUS

## 2013-08-13 NOTE — Patient Instructions (Signed)
Anticoagulation Dose Instructions as of 08/13/2013     Candace [name] Tue Wed Thu Fri Sat   New Dose 2 mg 2 mg 2 mg 2 mg 2 mg 2 mg 2 mg    Description       Continue warfarin 2mg  1 tablet once a day.        INR was 2.9

## 2013-08-13 NOTE — Progress Notes (Signed)
Patient with osteoporosis with history of vertebral fractures and kyphoplasty.  She has been getting Prolia 60mg  injected SQ every 6 months for the last 2 years.  Due to have Dexa recheck 09/2013 - appt already made.   Prolia 60mg  SQ injected into left arm.

## 2013-09-09 ENCOUNTER — Ambulatory Visit (INDEPENDENT_AMBULATORY_CARE_PROVIDER_SITE_OTHER): Payer: Medicare Other | Admitting: Pharmacist

## 2013-09-09 ENCOUNTER — Ambulatory Visit (INDEPENDENT_AMBULATORY_CARE_PROVIDER_SITE_OTHER): Payer: Medicare Other

## 2013-09-09 ENCOUNTER — Encounter: Payer: Self-pay | Admitting: Pharmacist

## 2013-09-09 VITALS — Ht <= 58 in | Wt 93.0 lb

## 2013-09-09 DIAGNOSIS — I480 Paroxysmal atrial fibrillation: Secondary | ICD-10-CM

## 2013-09-09 DIAGNOSIS — I4891 Unspecified atrial fibrillation: Secondary | ICD-10-CM

## 2013-09-09 DIAGNOSIS — M8080XD Other osteoporosis with current pathological fracture, unspecified site, subsequent encounter for fracture with routine healing: Secondary | ICD-10-CM

## 2013-09-09 DIAGNOSIS — M810 Age-related osteoporosis without current pathological fracture: Secondary | ICD-10-CM

## 2013-09-09 DIAGNOSIS — Z7901 Long term (current) use of anticoagulants: Secondary | ICD-10-CM

## 2013-09-09 DIAGNOSIS — M8000XD Age-related osteoporosis with current pathological fracture, unspecified site, subsequent encounter for fracture with routine healing: Secondary | ICD-10-CM

## 2013-09-09 DIAGNOSIS — M8448XD Pathological fracture, other site, subsequent encounter for fracture with routine healing: Secondary | ICD-10-CM

## 2013-09-09 DIAGNOSIS — I82401 Acute embolism and thrombosis of unspecified deep veins of right lower extremity: Secondary | ICD-10-CM

## 2013-09-09 DIAGNOSIS — I82409 Acute embolism and thrombosis of unspecified deep veins of unspecified lower extremity: Secondary | ICD-10-CM

## 2013-09-09 LAB — HM DEXA SCAN

## 2013-09-09 LAB — POCT INR: INR: 2.8

## 2013-09-09 NOTE — Progress Notes (Signed)
Patient ID: Candace [name], female   DOB: 06-03-1922, 80 y.o.   MRN: [id number] [facility] Current Height: Height: 4\' 9"  (144.8 cm)      Max Lifetime Height:  5\' 3"  Current Weight: Weight: 93 lb (42.185 kg)       Ethnicity:Caucasian    HPI: Patient with osteoporosis and history of vertebral fractures.  She has had vertebralplasty in past.  Last month was noted to have vertebral fractures and severe back pain but was determined not a candidate for repeat vertebralplasty.  She has recently received an steroid epidural which has relieved back pain tremendouly per patient.  Positive kyphosis.  Has been getting Prolia 60mg  SQ q 6 month since around Feb 2013.  Last injection was 08/12/2013.  Has tolerated well.  She also have tried Reclast - tolerated OK, Forteo - caused cramping, Boniva - rash                                                             PMH: Age at menopause:  51's Hysterectomy?  No Oophorectomy?  No HRT? No Steroid Use?  No Thyroid med?  No History of cancer?  Yes - skin cancer History of digestive disorders (ie Crohn's)?  Yes - GERD  But no currently taking PPI Current or previous eating disorders?  No Last Vitamin D Result:  34 (2013) Last GFR Result:  33 (05/15/13)   FH/SH: Family history of osteoporosis?  No Parent with history of hip fracture?  No Family history of breast cancer?  No Exercise?  Yes - walking daily Smoking?  No Alcohol?  No    Calcium Assessment Calcium Intake  # of servings/day  Calcium mg  Milk (8 oz) 0  x  300  = 0  Yogurt (4 oz) 0.5 x  200 = 100mg   Cheese (1 oz) 1 x  200 = 200mg   Other Calcium sources   250mg   Ca supplement 0 = 0   Estimated calcium intake per day 550mg     DEXA Results Date of Test T-Score for AP Spine L1-L4 T-Score for Total Left Hip T-Score for Total Right Hip  09/09/213 -4.6 -3.3 -3.6  01/10/2011 -4.8 -3.3 -3.6  03/24/2008 -4.5 -3.1 -3.3  01/28/2006 -4.8 -3.3 -3.2   INR today was  2.8  Assessment: Osteoporosis with history of vertebral fracture - BMD stable Therapeitic anticoagulation / Afib / DVT  Recommendations: 1.  Continue Prolia 60mg  SQ q 6 months 2.  recommend calcium 1200mg  daily through supplementation or diet.  3.  continue weight bearing exercise - 30 minutes at least 4 days per week.   4.  Counseled and educated about fall risk and prevention. 5.   Anticoagulation Dose Instructions as of 09/09/2013     Dorene Grebe Tue Wed Thu Fri Sat   New Dose 2 mg 2 mg 2 mg 2 mg 2 mg 2 mg 2 mg    Description       Continue warfarin 2mg  1 tablet once a day.       RTC in 4-6 weeks to recheck INR  Recheck DEXA:  2 years  Time spent counseling patient:  30 minutes   Cherre Robins, PharmD, CPP

## 2013-09-09 NOTE — Patient Instructions (Addendum)
Anticoagulation Dose Instructions as of 09/09/2013     Dorene Grebe Tue Wed Thu Fri Sat   New Dose 2 mg 2 mg 2 mg 2 mg 2 mg 2 mg 2 mg    Description       Continue warfarin 2mg  1 tablet once a day.        INR was 2.8 today   Fall Prevention and Home Safety Falls cause injuries and can affect all age groups. It is possible to use preventive measures to significantly decrease the likelihood of falls. There are many simple measures which can make your home safer and prevent falls. OUTDOORS  Repair cracks and edges of walkways and driveways.  Remove high doorway thresholds.  Trim shrubbery on the main path into your home.  Have good outside lighting.  Clear walkways of tools, rocks, debris, and clutter.  Check that handrails are not broken and are securely fastened. Both sides of steps should have handrails.  Have leaves, snow, and ice cleared regularly.  Use sand or salt on walkways during winter months.  In the garage, clean up grease or oil spills. BATHROOM  Install night lights.  Install grab bars by the toilet and in the tub and shower.  Use non-skid mats or decals in the tub or shower.  Place a plastic non-slip stool in the shower to sit on, if needed.  Keep floors dry and clean up all water on the floor immediately.  Remove soap buildup in the tub or shower on a regular basis.  Secure bath mats with non-slip, double-sided rug tape.  Remove throw rugs and tripping hazards from the floors. BEDROOMS  Install night lights.  Make sure a bedside light is easy to reach.  Do not use oversized bedding.  Keep a telephone by your bedside.  Have a firm chair with side arms to use for getting dressed.  Remove throw rugs and tripping hazards from the floor. KITCHEN  Keep handles on pots and pans turned toward the center of the stove. Use back burners when possible.  Clean up spills quickly and allow time for drying.  Avoid walking on wet floors.  Avoid hot  utensils and knives.  Position shelves so they are not too high or low.  Place commonly used objects within easy reach.  If necessary, use a sturdy step stool with a grab bar when reaching.  Keep electrical cables out of the way.  Do not use floor polish or wax that makes floors slippery. If you must use wax, use non-skid floor wax.  Remove throw rugs and tripping hazards from the floor. STAIRWAYS  Never leave objects on stairs.  Place handrails on both sides of stairways and use them. Fix any loose handrails. Make sure handrails on both sides of the stairways are as long as the stairs.  Check carpeting to make sure it is firmly attached along stairs. Make repairs to worn or loose carpet promptly.  Avoid placing throw rugs at the top or bottom of stairways, or properly secure the rug with carpet tape to prevent slippage. Get rid of throw rugs, if possible.  Have an electrician put in a light switch at the top and bottom of the stairs. OTHER FALL PREVENTION TIPS  Wear low-heel or rubber-soled shoes that are supportive and fit well. Wear closed toe shoes.  When using a stepladder, make sure it is fully opened and both spreaders are firmly locked. Do not climb a closed stepladder.  Add color or contrast paint  or tape to grab bars and handrails in your home. Place contrasting color strips on first and last steps.  Learn and use mobility aids as needed. Install an electrical emergency response system.  Turn on lights to avoid dark areas. Replace light bulbs that burn out immediately. Get light switches that glow.  Arrange furniture to create clear pathways. Keep furniture in the same place.  Firmly attach carpet with non-skid or double-sided tape.  Eliminate uneven floor surfaces.  Select a carpet pattern that does not visually hide the edge of steps.  Be aware of all pets. OTHER HOME SAFETY TIPS  Set the water temperature for 120 F (48.8 C).  Keep emergency numbers on  or near the telephone.  Keep smoke detectors on every level of the home and near sleeping areas. Document Released: 12/15/2001 Document Revised: 06/26/2011 Document Reviewed: 03/16/2011 [facility] Patient Information 2015 [place], [place]. This information is not intended to replace advice given to you by your health care provider. Make sure you discuss any questions you have with your health care provider.

## 2013-10-09 ENCOUNTER — Ambulatory Visit (INDEPENDENT_AMBULATORY_CARE_PROVIDER_SITE_OTHER): Payer: Medicare Other | Admitting: Pharmacist

## 2013-10-09 DIAGNOSIS — I82401 Acute embolism and thrombosis of unspecified deep veins of right lower extremity: Secondary | ICD-10-CM

## 2013-10-09 DIAGNOSIS — Z7901 Long term (current) use of anticoagulants: Secondary | ICD-10-CM

## 2013-10-09 DIAGNOSIS — I480 Paroxysmal atrial fibrillation: Secondary | ICD-10-CM

## 2013-10-09 DIAGNOSIS — I4891 Unspecified atrial fibrillation: Secondary | ICD-10-CM

## 2013-10-09 LAB — POCT INR: INR: 2.4

## 2013-10-09 NOTE — Patient Instructions (Signed)
Anticoagulation Dose Instructions as of 10/09/2013     Candace [name] Tue Wed Thu Fri Sat   New Dose 2 mg 2 mg 2 mg 2 mg 2 mg 2 mg 2 mg    Description       Continue warfarin 2mg  1 tablet once a day.        INR was 2.4 today

## 2013-11-16 ENCOUNTER — Encounter (INDEPENDENT_AMBULATORY_CARE_PROVIDER_SITE_OTHER): Payer: Self-pay

## 2013-11-16 ENCOUNTER — Ambulatory Visit (INDEPENDENT_AMBULATORY_CARE_PROVIDER_SITE_OTHER): Payer: Medicare Other | Admitting: Pharmacist

## 2013-11-16 DIAGNOSIS — I4891 Unspecified atrial fibrillation: Secondary | ICD-10-CM

## 2013-11-16 DIAGNOSIS — I82401 Acute embolism and thrombosis of unspecified deep veins of right lower extremity: Secondary | ICD-10-CM

## 2013-11-16 DIAGNOSIS — Z7901 Long term (current) use of anticoagulants: Secondary | ICD-10-CM

## 2013-11-16 DIAGNOSIS — I480 Paroxysmal atrial fibrillation: Secondary | ICD-10-CM

## 2013-11-16 LAB — POCT INR: INR: 2.5

## 2013-11-16 NOTE — Patient Instructions (Signed)
Anticoagulation Dose Instructions as of 11/16/2013      Candace [name] Tue Wed Thu Fri Sat   New Dose 2 mg 2 mg 2 mg 2 mg 2 mg 2 mg 2 mg    Description        Continue warfarin 2mg  1 tablet once a day.        INR was 2.5 today

## 2013-11-17 ENCOUNTER — Encounter: Payer: Self-pay | Admitting: Family Medicine

## 2013-11-17 ENCOUNTER — Ambulatory Visit (INDEPENDENT_AMBULATORY_CARE_PROVIDER_SITE_OTHER): Payer: Medicare Other | Admitting: Family Medicine

## 2013-11-17 VITALS — BP 132/88 | HR 105 | Temp 97.0°F | Ht <= 58 in | Wt 88.8 lb

## 2013-11-17 DIAGNOSIS — M1990 Unspecified osteoarthritis, unspecified site: Secondary | ICD-10-CM

## 2013-11-17 NOTE — Progress Notes (Signed)
   Subjective:    Patient ID: Candace [name], female    DOB: 10-30-1922, 80 y.o.   MRN: [id number]  HPI Patient is c/o left hand and finger discomfort.  Review of Systems  Constitutional: Negative for fever.  HENT: Negative for ear pain.   Eyes: Negative for discharge.  Respiratory: Negative for cough.   Cardiovascular: Negative for chest pain.  Gastrointestinal: Negative for abdominal distention.  Endocrine: Negative for polyuria.  Genitourinary: Negative for difficulty urinating.  Musculoskeletal: Negative for gait problem and neck pain.  Skin: Negative for color change and rash.  Neurological: Negative for speech difficulty and headaches.  Psychiatric/Behavioral: Negative for agitation.       Objective:    BP 132/88 mmHg  Pulse 105  Temp(Src) 97 F (36.1 C) (Oral)  Ht 4\' 9"  (1.448 m)  Wt 88 lb 12.8 oz (40.279 kg)  BMI 19.21 kg/m2 Physical Exam  Constitutional: She is oriented to person, place, and time. She appears well-developed and well-nourished.  HENT:  Head: Normocephalic and atraumatic.  Mouth/Throat: Oropharynx is clear and moist.  Eyes: Pupils are equal, round, and reactive to light.  Neck: Normal range of motion. Neck supple.  Cardiovascular: Normal rate and regular rhythm.   No murmur heard. Pulmonary/Chest: Effort normal and breath sounds normal.  Abdominal: Soft. Bowel sounds are normal. There is no tenderness.  Musculoskeletal:  Bilateral hands with synovial thickening at MCP's and DIPs and PIPs all fingers bilateral hands  Neurological: She is alert and oriented to person, place, and time.  Skin: Skin is warm and dry.  Psychiatric: She has a normal mood and affect.          Assessment & Plan:     ICD-9-CM ICD-10-CM   1. Arthritis 716.90 M19.90    Tylenol otc  Return if symptoms worsen or fail to improve.  Lysbeth Penner FNP

## 2013-12-07 ENCOUNTER — Other Ambulatory Visit: Payer: Self-pay | Admitting: Nurse Practitioner

## 2013-12-07 ENCOUNTER — Telehealth: Payer: Self-pay | Admitting: Nurse Practitioner

## 2013-12-07 MED ORDER — LISINOPRIL 40 MG PO TABS: 40.0000 mg | ORAL_TABLET | Freq: Every day | ORAL | Status: DC

## 2013-12-07 NOTE — Telephone Encounter (Signed)
Script for lisinopril sent in. Patient has appt  01-28-2013

## 2013-12-07 NOTE — Telephone Encounter (Signed)
Sent in b/p med 

## 2013-12-28 ENCOUNTER — Telehealth: Payer: Self-pay | Admitting: Pharmacist

## 2013-12-28 ENCOUNTER — Ambulatory Visit (INDEPENDENT_AMBULATORY_CARE_PROVIDER_SITE_OTHER): Payer: Medicare Other | Admitting: Pharmacist

## 2013-12-28 VITALS — BP 104/62 | HR 66

## 2013-12-28 DIAGNOSIS — Z7901 Long term (current) use of anticoagulants: Secondary | ICD-10-CM

## 2013-12-28 DIAGNOSIS — I480 Paroxysmal atrial fibrillation: Secondary | ICD-10-CM

## 2013-12-28 DIAGNOSIS — I4891 Unspecified atrial fibrillation: Secondary | ICD-10-CM

## 2013-12-28 DIAGNOSIS — I82401 Acute embolism and thrombosis of unspecified deep veins of right lower extremity: Secondary | ICD-10-CM

## 2013-12-28 LAB — POCT INR: INR: 1.8

## 2013-12-28 MED ORDER — AMLODIPINE BESYLATE 2.5 MG PO TABS: 2.5000 mg | ORAL_TABLET | Freq: Every day | ORAL | Status: DC

## 2013-12-28 NOTE — Telephone Encounter (Signed)
Yes ok to come earlier.  I had cancellation at 2pm if she can come then or just come at 3:15 and I will work her in.

## 2013-12-28 NOTE — Progress Notes (Signed)
Patient requested amlodipine 5mg  refills however rx was removed from her medication list 07/2013.  This was due to patient not taking amlodipine but per patient report she has been taking regularly.  I called her pharmacy and patient has been getting amlodipine filled regularly for the last year. BP was at goal today, maybe even on the low side. Will send in Rx for amlodipine 2.5mg  1 tablet daily due to patient's age.  Recheck BP in 1 month

## 2013-12-28 NOTE — Patient Instructions (Addendum)
Decrease amlopidine to 2.5mg  1 tablet daily (or you can take 1/2 tablet on amlodipine 5mg )  Anticoagulation Dose Instructions as of 12/28/2013      Candace [name] Tue Wed Thu Fri Sat   New Dose 2 mg 2 mg 2 mg 2 mg 2 mg 2 mg 2 mg    Description        Take 1 and 1/2 tablets today , then continue warfarin 2mg  1 tablet once a day.        INR was 1.8 today

## 2014-01-08 DIAGNOSIS — Z85828 Personal history of other malignant neoplasm of skin: Secondary | ICD-10-CM

## 2014-01-08 HISTORY — DX: Personal history of other malignant neoplasm of skin: Z85.828

## 2014-01-08 HISTORY — PX: SKIN CANCER EXCISION: SHX779

## 2014-01-23 ENCOUNTER — Other Ambulatory Visit: Payer: Self-pay | Admitting: Pharmacist

## 2014-01-25 ENCOUNTER — Telehealth: Payer: Self-pay | Admitting: Nurse Practitioner

## 2014-01-25 DIAGNOSIS — J309 Allergic rhinitis, unspecified: Secondary | ICD-10-CM | POA: Diagnosis not present

## 2014-01-25 NOTE — Telephone Encounter (Signed)
Patients daughter was advised to take her to the urgent care since we are out of appointments today.

## 2014-01-28 ENCOUNTER — Encounter: Payer: Self-pay | Admitting: Nurse Practitioner

## 2014-01-28 ENCOUNTER — Ambulatory Visit (INDEPENDENT_AMBULATORY_CARE_PROVIDER_SITE_OTHER): Payer: Medicare Other | Admitting: Nurse Practitioner

## 2014-01-28 VITALS — BP 111/81 | HR 66 | Temp 96.8°F | Ht <= 58 in | Wt 89.0 lb

## 2014-01-28 DIAGNOSIS — Z720 Tobacco use: Secondary | ICD-10-CM

## 2014-01-28 DIAGNOSIS — F17200 Nicotine dependence, unspecified, uncomplicated: Secondary | ICD-10-CM | POA: Insufficient documentation

## 2014-01-28 DIAGNOSIS — M8000XD Age-related osteoporosis with current pathological fracture, unspecified site, subsequent encounter for fracture with routine healing: Secondary | ICD-10-CM | POA: Diagnosis not present

## 2014-01-28 DIAGNOSIS — I4891 Unspecified atrial fibrillation: Secondary | ICD-10-CM | POA: Diagnosis not present

## 2014-01-28 DIAGNOSIS — E785 Hyperlipidemia, unspecified: Secondary | ICD-10-CM | POA: Diagnosis not present

## 2014-01-28 DIAGNOSIS — Z7901 Long term (current) use of anticoagulants: Secondary | ICD-10-CM

## 2014-01-28 DIAGNOSIS — I82401 Acute embolism and thrombosis of unspecified deep veins of right lower extremity: Secondary | ICD-10-CM | POA: Diagnosis not present

## 2014-01-28 DIAGNOSIS — R634 Abnormal weight loss: Secondary | ICD-10-CM

## 2014-01-28 DIAGNOSIS — I10 Essential (primary) hypertension: Secondary | ICD-10-CM

## 2014-01-28 LAB — POCT INR: INR: 1.6

## 2014-01-28 LAB — LIPID PANEL: LDL Cholesterol: 79 mg/dL

## 2014-01-28 MED ORDER — METOPROLOL TARTRATE 25 MG PO TABS
12.5000 mg | ORAL_TABLET | Freq: Two times a day (BID) | ORAL | Status: DC
Start: 2014-01-28 — End: 2014-08-31

## 2014-01-28 MED ORDER — SIMVASTATIN 40 MG PO TABS: 40.0000 mg | ORAL_TABLET | Freq: Every day | ORAL | Status: DC

## 2014-01-28 MED ORDER — MEGESTROL ACETATE 20 MG PO TABS: 20.0000 mg | ORAL_TABLET | Freq: Every day | ORAL | Status: DC

## 2014-01-28 MED ORDER — FUROSEMIDE 20 MG PO TABS: ORAL_TABLET | ORAL | Status: DC

## 2014-01-28 MED ORDER — LISINOPRIL 40 MG PO TABS: 40.0000 mg | ORAL_TABLET | Freq: Every day | ORAL | Status: DC

## 2014-01-28 MED ORDER — WARFARIN SODIUM 2 MG PO TABS: 2.0000 mg | ORAL_TABLET | Freq: Every day | ORAL | Status: DC

## 2014-01-28 NOTE — Patient Instructions (Signed)
Anticoagulation Dose Instructions as of 01/28/2014      Dorene Grebe Tue Wed Thu Fri Sat   New Dose 2 mg 2 mg 2 mg 2 mg 2 mg 3 mg 2 mg    Description        Take 1 and 1/2 tablets today , then increase warfarin 2mg  1 and 1/2 tablets on fridays and 1 tablet all other days.      INR was 1.6 today

## 2014-01-28 NOTE — Progress Notes (Signed)
Subjective:    Patient ID: Candace [name], female    DOB: 08-10-1922, 81 y.o.   MRN: [id number]   Patient here today for follow up of chronic medical problems. Went to ER with c/o weight loss- has poor appetite   Hypertension This is a chronic problem. The current episode started more than 1 year ago. The problem is unchanged. The problem is controlled. Risk factors for coronary artery disease include dyslipidemia and post-menopausal state. Past treatments include ACE inhibitors and calcium channel blockers. The current treatment provides moderate improvement. Compliance problems include diet and exercise.   Hyperlipidemia This is a chronic problem. The current episode started more than 1 year ago. The problem is controlled. Recent lipid tests were reviewed and are normal. Current antihyperlipidemic treatment includes statins. The current treatment provides moderate improvement of lipids. Compliance problems include adherence to diet and adherence to exercise.  Risk factors for coronary artery disease include dyslipidemia, hypertension and post-menopausal.  atrial fib Chronic coumadin therapy- will do INR today- no c/o palpiutations Osteoporosis with compression fracture prolia works well.      Review of Systems  Constitutional: Negative.   HENT: Negative.   Respiratory: Negative.   Cardiovascular: Negative.   Gastrointestinal: Negative.   Genitourinary: Negative.   Neurological: Negative.   Psychiatric/Behavioral: Negative.   All other systems reviewed and are negative.      Objective:   Physical Exam  Constitutional: She is oriented to person, place, and time. She appears well-developed and well-nourished.  HENT:  Nose: Nose normal.  Mouth/Throat: Oropharynx is clear and moist.  Eyes: EOM are normal.  Neck: Trachea normal, normal range of motion and full passive range of motion without pain. Neck supple. No JVD present. Carotid bruit is not present. No thyromegaly  present.  Cardiovascular: Normal rate, regular rhythm, normal heart sounds and intact distal pulses.  Exam reveals no gallop and no friction rub.   No murmur heard. Pulmonary/Chest: Effort normal and breath sounds normal.  Abdominal: Soft. Bowel sounds are normal. She exhibits no distension and no mass. There is no tenderness.  Musculoskeletal: Normal range of motion.  Lymphadenopathy:    She has no cervical adenopathy.  Neurological: She is alert and oriented to person, place, and time. She has normal reflexes.  Skin: Skin is warm and dry.  Psychiatric: She has a normal mood and affect. Her behavior is normal. Judgment and thought content normal.   BP 111/81 mmHg  Pulse 66  Temp(Src) 96.8 F (36 C) (Oral)  Ht _0  (1.448 m)  Wt 89 lb (40.37 kg)  BMI 19.25 kg/m2         Assessment & Plan:  1. Long term current use of anticoagulant therapy Addressed INR at appointment - POCT INR  2. Atrial fibrillation Avoid caffeine - POCT INR - warfarin (COUMADIN) 2 MG tablet; Take 1 tablet (2 mg total) by mouth daily. Take 1 or 2 tablets daily as instructed by anticoagulation  Dispense: 90 tablet; Refill: 1  3. Essential hypertension Do not add salt to diet - furosemide (LASIX) 20 MG tablet; TAKE ONE-HALF TABLET BY MOUTH ONCE DAILY IN THE MORNING WITH A BANANA  Dispense: 90 tablet; Refill: 1 - lisinopril (PRINIVIL,ZESTRIL) 40 MG tablet; Take 1 tablet (40 mg total) by mouth daily.  Dispense: 90 tablet; Refill: 1 - metoprolol tartrate (LOPRESSOR) 25 MG tablet; Take 0.5 tablets (12.5 mg total) by mouth 2 (two) times daily.  Dispense: 90 tablet; Refill: 1 - CMP14+EGFR  4. DVT (deep  venous thrombosis), right  5. Osteoporosis with pathological fracture, with routine healing, subsequent encounter Weight bearing exercise  6. Tobacco use disorder Stop DIPPING  7. Hyperlipidemia with target LDL less than 100 Low fta diet - simvastatin (ZOCOR) 40 MG tablet; Take 1 tablet (40 mg total) by  mouth at bedtime.  Dispense: 90 tablet; Refill: 1 - NMR, lipoprofile  8. Loss of weight Eat 3 meals a day - megestrol (MEGACE) 20 MG tablet; Take 1 tablet (20 mg total) by mouth daily.  Dispense: 90 tablet; Refill: 1   Refuses adult immunizations Labs pending Health maintenance reviewed Diet and exercise encouraged Continue all meds Follow up  In 3 months   Babcock, FNP

## 2014-01-28 NOTE — Addendum Note (Signed)
Addended by: Chevis Pretty on: 01/28/2014 05:12 PM   Modules accepted: Orders

## 2014-01-29 LAB — CMP14+EGFR
ALBUMIN: 4 g/dL (ref 3.5–4.7)
ALK PHOS: 44 IU/L (ref 39–117)
ALT: 15 IU/L (ref 0–32)
AST: 23 IU/L (ref 0–40)
Albumin/Globulin Ratio: 1.8 (ref 1.1–2.5)
BUN/Creatinine Ratio: 27 — ABNORMAL HIGH (ref 11–26)
BUN: 40 mg/dL — ABNORMAL HIGH (ref 8–27)
CO2: 24 mmol/L (ref 18–29)
Calcium: 9.2 mg/dL (ref 8.7–10.3)
Chloride: 93 mmol/L — ABNORMAL LOW (ref 97–108)
Creatinine, Ser: 1.46 mg/dL — ABNORMAL HIGH (ref 0.57–1.00)
GFR calc Af Amer: 37 mL/min/{1.73_m2} — ABNORMAL LOW (ref >59)
GFR calc non Af Amer: 32 mL/min/{1.73_m2} — ABNORMAL LOW (ref >59)
Globulin, Total: 2.2 g/dL (ref 1.5–4.5)
Glucose: 85 mg/dL (ref 65–99)
Potassium: 5.9 mmol/L (ref 3.5–5.2)
Sodium: 131 mmol/L — ABNORMAL LOW (ref 134–144)
Total Bilirubin: 0.3 mg/dL (ref 0.0–1.2)
Total Protein: 6.2 g/dL (ref 6.0–8.5)

## 2014-01-29 LAB — NMR, LIPOPROFILE
CHOLESTEROL: 168 mg/dL (ref 100–199)
HDL Cholesterol by NMR: 61 mg/dL (ref >39)
HDL Particle Number: 39.3 umol/L (ref >=30.5)
LDL Particle Number: 1048 nmol/L — ABNORMAL HIGH (ref <1000)
LDL SIZE: 20.8 nm (ref >20.5)
LDL-C: 79 mg/dL (ref 0–99)
LP-IR Score: 50 — ABNORMAL HIGH (ref <=45)
Small LDL Particle Number: 515 nmol/L (ref <=527)
Triglycerides by NMR: 142 mg/dL (ref 0–149)

## 2014-02-01 ENCOUNTER — Other Ambulatory Visit: Payer: Self-pay | Admitting: Pharmacist

## 2014-02-01 ENCOUNTER — Encounter: Payer: Self-pay | Admitting: Nurse Practitioner

## 2014-02-01 ENCOUNTER — Telehealth: Payer: Self-pay | Admitting: *Deleted

## 2014-02-01 ENCOUNTER — Telehealth: Payer: Self-pay | Admitting: Pharmacist

## 2014-02-01 DIAGNOSIS — M810 Age-related osteoporosis without current pathological fracture: Secondary | ICD-10-CM

## 2014-02-01 MED ORDER — DENOSUMAB 60 MG/ML ~~LOC~~ SOLN: 60.0000 mg | SUBCUTANEOUS | Status: DC

## 2014-02-01 MED ORDER — DENOSUMAB 60 MG/ML ~~LOC~~ SOLN
60.0000 mg | SUBCUTANEOUS | Status: DC
Start: 2014-02-01 — End: 2014-03-03

## 2014-02-01 NOTE — Telephone Encounter (Signed)
Called [facility] and they did not have.  Resent Prolia Rx.

## 2014-02-01 NOTE — Telephone Encounter (Signed)
Patient notified of elevated potassium result. Patient denys any chest pain. She will get back to clinic as soon as she can for a repeat.

## 2014-02-01 NOTE — Telephone Encounter (Signed)
[facility] called to fill and see if PA needed but instructed to hold off on ordering Prolia until patient's serum creatinine is rechecked.

## 2014-02-02 ENCOUNTER — Other Ambulatory Visit (INDEPENDENT_AMBULATORY_CARE_PROVIDER_SITE_OTHER): Payer: Medicare Other

## 2014-02-02 DIAGNOSIS — D485 Neoplasm of uncertain behavior of skin: Secondary | ICD-10-CM | POA: Diagnosis not present

## 2014-02-02 DIAGNOSIS — C44329 Squamous cell carcinoma of skin of other parts of face: Secondary | ICD-10-CM | POA: Diagnosis not present

## 2014-02-02 DIAGNOSIS — D0439 Carcinoma in situ of skin of other parts of face: Secondary | ICD-10-CM | POA: Diagnosis not present

## 2014-02-02 DIAGNOSIS — E875 Hyperkalemia: Secondary | ICD-10-CM | POA: Diagnosis not present

## 2014-02-02 NOTE — Progress Notes (Signed)
Lab only 

## 2014-02-03 ENCOUNTER — Other Ambulatory Visit (INDEPENDENT_AMBULATORY_CARE_PROVIDER_SITE_OTHER): Payer: Medicare Other

## 2014-02-03 DIAGNOSIS — E875 Hyperkalemia: Secondary | ICD-10-CM | POA: Diagnosis not present

## 2014-02-03 LAB — BMP8+EGFR
BUN / CREAT RATIO: 29 — AB (ref 11–26)
BUN: 46 mg/dL — ABNORMAL HIGH (ref 8–27)
CO2: 17 mmol/L — ABNORMAL LOW (ref 18–29)
CREATININE: 1.58 mg/dL — AB (ref 0.57–1.00)
Calcium: 8.7 mg/dL (ref 8.7–10.3)
Chloride: 101 mmol/L (ref 97–108)
GFR calc non Af Amer: 29 mL/min/{1.73_m2} — ABNORMAL LOW (ref >59)
GFR, EST AFRICAN AMERICAN: 33 mL/min/{1.73_m2} — AB (ref >59)
Glucose: 110 mg/dL — ABNORMAL HIGH (ref 65–99)
Potassium: 5.9 mmol/L (ref 3.5–5.2)
SODIUM: 138 mmol/L (ref 134–144)

## 2014-02-04 ENCOUNTER — Other Ambulatory Visit: Payer: Self-pay | Admitting: Nurse Practitioner

## 2014-02-04 LAB — POTASSIUM: POTASSIUM: 6 mmol/L — AB (ref 3.5–5.2)

## 2014-02-04 MED ORDER — SODIUM POLYSTYRENE SULFONATE PO POWD: Freq: Once | ORAL | Status: DC

## 2014-02-23 DIAGNOSIS — C44329 Squamous cell carcinoma of skin of other parts of face: Secondary | ICD-10-CM | POA: Diagnosis not present

## 2014-03-01 ENCOUNTER — Ambulatory Visit (INDEPENDENT_AMBULATORY_CARE_PROVIDER_SITE_OTHER): Payer: Medicare Other | Admitting: Pharmacist

## 2014-03-01 DIAGNOSIS — R748 Abnormal levels of other serum enzymes: Secondary | ICD-10-CM | POA: Diagnosis not present

## 2014-03-01 DIAGNOSIS — I480 Paroxysmal atrial fibrillation: Secondary | ICD-10-CM | POA: Diagnosis not present

## 2014-03-01 DIAGNOSIS — R7989 Other specified abnormal findings of blood chemistry: Secondary | ICD-10-CM | POA: Diagnosis not present

## 2014-03-01 DIAGNOSIS — Z7901 Long term (current) use of anticoagulants: Secondary | ICD-10-CM

## 2014-03-01 DIAGNOSIS — I82401 Acute embolism and thrombosis of unspecified deep veins of right lower extremity: Secondary | ICD-10-CM | POA: Diagnosis not present

## 2014-03-01 DIAGNOSIS — I4891 Unspecified atrial fibrillation: Secondary | ICD-10-CM | POA: Diagnosis not present

## 2014-03-01 LAB — POCT INR: INR: 1.5

## 2014-03-01 NOTE — Patient Instructions (Signed)
Anticoagulation Dose Instructions as of 03/01/2014      Candace [name] Tue Wed Thu Fri Sat   New Dose 2 mg 2 mg 2 mg 2 mg 2 mg 3 mg 2 mg    Description        Take 1 and 1/2 tablets today - Monday, February 22nd.  Then increase warfarin 2mg  1 and 1/2 tablets on fridays and 1 tablet all other days.      INR was 1.5 today

## 2014-03-02 LAB — BMP8+EGFR
BUN / CREAT RATIO: 20 (ref 11–26)
BUN: 26 mg/dL (ref 8–27)
CHLORIDE: 105 mmol/L (ref 97–108)
CO2: 18 mmol/L (ref 18–29)
CREATININE: 1.28 mg/dL — AB (ref 0.57–1.00)
Calcium: 9 mg/dL (ref 8.7–10.3)
GFR calc non Af Amer: 37 mL/min/{1.73_m2} — ABNORMAL LOW (ref >59)
GFR, EST AFRICAN AMERICAN: 43 mL/min/{1.73_m2} — AB (ref >59)
Glucose: 117 mg/dL — ABNORMAL HIGH (ref 65–99)
Potassium: 5.8 mmol/L — ABNORMAL HIGH (ref 3.5–5.2)
Sodium: 140 mmol/L (ref 134–144)

## 2014-03-03 ENCOUNTER — Other Ambulatory Visit: Payer: Self-pay | Admitting: Pharmacist

## 2014-03-03 DIAGNOSIS — I10 Essential (primary) hypertension: Secondary | ICD-10-CM

## 2014-03-03 NOTE — Telephone Encounter (Signed)
Serum creatinine better/ Potassium still elevated.  Will first stop megace as this can sometimes affect serum creatinine / potassium.  Recheck BMET next week. If still elevated then will consider decreasing lisinopril dose. Stop eating banana with furosemide.

## 2014-03-17 ENCOUNTER — Telehealth: Payer: Self-pay | Admitting: Pharmacist

## 2014-03-17 NOTE — Telephone Encounter (Signed)
Patient missed appt Monday.  Rescheduled for tomorrow.

## 2014-03-18 ENCOUNTER — Ambulatory Visit (INDEPENDENT_AMBULATORY_CARE_PROVIDER_SITE_OTHER): Payer: Medicare Other | Admitting: Pharmacist

## 2014-03-18 DIAGNOSIS — I82401 Acute embolism and thrombosis of unspecified deep veins of right lower extremity: Secondary | ICD-10-CM

## 2014-03-18 DIAGNOSIS — I4891 Unspecified atrial fibrillation: Secondary | ICD-10-CM | POA: Diagnosis not present

## 2014-03-18 DIAGNOSIS — M810 Age-related osteoporosis without current pathological fracture: Secondary | ICD-10-CM

## 2014-03-18 DIAGNOSIS — I482 Chronic atrial fibrillation, unspecified: Secondary | ICD-10-CM

## 2014-03-18 DIAGNOSIS — Z7901 Long term (current) use of anticoagulants: Secondary | ICD-10-CM

## 2014-03-18 LAB — POCT INR: INR: 1.9

## 2014-03-18 MED ORDER — DENOSUMAB 60 MG/ML ~~LOC~~ SOLN
60.0000 mg | Freq: Once | SUBCUTANEOUS | Status: AC
  Administered 2014-03-18: 60 mg via SUBCUTANEOUS

## 2014-03-18 NOTE — Progress Notes (Signed)
Patient ID: Candace [name], female   DOB: August 11, 1922, 81 y.o.   MRN: [id number] [facility]   HPI: Patient with osteoporosis and history of vertebral fractures.  She has had vertebralplasty in past.   Positive kyphosis.  Has been getting Prolia 60mg  SQ q 6 month since around Feb 2013.  Last injection was 08/12/2013.  Has tolerated well.  She also have tried Reclast - tolerated OK, Forteo - caused cramping, Boniva - rash                                                             PMH: Age at menopause:  3's Hysterectomy?  No Oophorectomy?  No HRT? No Steroid Use?  No Thyroid med?  No History of cancer?  Yes - skin cancer History of digestive disorders (ie Crohn's)?  Yes - GERD  But no currently taking PPI Current or previous eating disorders?  No Last Vitamin D Result:  34 (2013) Last GFR Result:  37 (03/03/2014) Calcium = 9.0 (03/03/2014)   FH/SH: Family history of osteoporosis?  No Parent with history of hip fracture?  No Family history of breast cancer?  No Exercise?  Yes - walking daily Smoking?  No Alcohol?  No    Calcium Assessment Calcium Intake  # of servings/day  Calcium mg  Milk (8 oz) 0  x  300  = 0  Yogurt (4 oz) 0.5 x  200 = 100mg   Cheese (1 oz) 1 x  200 = 200mg   Other Calcium sources   250mg   Ca supplement 0 = 0   Estimated calcium intake per day 550mg     DEXA Results Date of Test T-Score for AP Spine L1-L4 T-Score for Total Left Hip T-Score for Total Right Hip  09/09/213 -4.6 -3.3 -3.6  01/10/2011 -4.8 -3.3 -3.6  03/24/2008 -4.5 -3.1 -3.3  01/28/2006 -4.8 -3.3 -3.2   INR today was 1.9  Assessment: Osteoporosis with history of vertebral fracture - BMD stable Sub therapeitic anticoagulation / Afib / DVT  Recommendations: 1.  Continue Prolia 60mg  SQ q 6 months - inject given in office today - patient supplied 2.  recommend calcium 1200mg  daily through supplementation or diet.  3.  continue weight bearing exercise - 30 minutes at least 4  days per week.   4.  Counseled and educated about fall risk and prevention. 5.   Anticoagulation Dose Instructions as of 03/18/2014      Dorene Grebe Tue Wed Thu Fri Sat   New Dose 2 mg 2 mg 2 mg 2 mg 2 mg 3 mg 2 mg    Description        Continue warfarin 2mg  1 and 1/2 tablets on fridays and 1 tablet all other days.     RTC in 4 weeks to recheck INR  Recheck DEXA:  2 years  Time spent counseling patient:  30 minutes   Cherre Robins, PharmD, CPP

## 2014-03-18 NOTE — Patient Instructions (Signed)
Anticoagulation Dose Instructions as of 03/18/2014      Candace [name] Tue Wed Thu Fri Sat   New Dose 2 mg 2 mg 2 mg 2 mg 2 mg 3 mg 2 mg    Description        Continue warfarin 2mg  1 and 1/2 tablets on fridays and 1 tablet all other days.     INR was 1.9 today

## 2014-04-15 ENCOUNTER — Ambulatory Visit (INDEPENDENT_AMBULATORY_CARE_PROVIDER_SITE_OTHER): Payer: Medicare Other | Admitting: Pharmacist

## 2014-04-15 DIAGNOSIS — I82401 Acute embolism and thrombosis of unspecified deep veins of right lower extremity: Secondary | ICD-10-CM | POA: Diagnosis not present

## 2014-04-15 DIAGNOSIS — I482 Chronic atrial fibrillation, unspecified: Secondary | ICD-10-CM

## 2014-04-15 DIAGNOSIS — Z7901 Long term (current) use of anticoagulants: Secondary | ICD-10-CM

## 2014-04-15 DIAGNOSIS — I4891 Unspecified atrial fibrillation: Secondary | ICD-10-CM | POA: Diagnosis not present

## 2014-04-15 LAB — POCT INR: INR: 1.7

## 2014-04-15 NOTE — Patient Instructions (Addendum)
Anticoagulation Dose Instructions as of 04/15/2014      Candace [name] Tue Wed Thu Fri Sat   New Dose 2 mg 3 mg 2 mg 2 mg 2 mg 3 mg 2 mg    Description        Take 1 and 1/2 tablets today - Thursday, April 7th.  Then increase dose to 1 and 1/2 tablets on mondays and fridays and 1 tablet all other day.      INR was 1.7 today (too thick)  Check warfarin bottle filled 03/10/2014.  Pills should be a light purple / blue color.  Marking on 1 side is "W2" marking on other side is IG with line down the middle.

## 2014-04-23 ENCOUNTER — Encounter: Payer: Self-pay | Admitting: Nurse Practitioner

## 2014-04-23 ENCOUNTER — Ambulatory Visit (INDEPENDENT_AMBULATORY_CARE_PROVIDER_SITE_OTHER): Payer: Medicare Other | Admitting: Nurse Practitioner

## 2014-04-23 VITALS — BP 131/91 | HR 88 | Temp 96.8°F | Ht <= 58 in | Wt 88.0 lb

## 2014-04-23 DIAGNOSIS — G5791 Unspecified mononeuropathy of right lower limb: Secondary | ICD-10-CM

## 2014-04-23 DIAGNOSIS — I4891 Unspecified atrial fibrillation: Secondary | ICD-10-CM

## 2014-04-23 DIAGNOSIS — G5792 Unspecified mononeuropathy of left lower limb: Secondary | ICD-10-CM

## 2014-04-23 DIAGNOSIS — G5793 Unspecified mononeuropathy of bilateral lower limbs: Secondary | ICD-10-CM

## 2014-04-23 LAB — POCT INR: INR: 2

## 2014-04-23 MED ORDER — GABAPENTIN (ONCE-DAILY) 300 MG PO TABS: 1.0000 | ORAL_TABLET | Freq: Every evening | ORAL | Status: DC | PRN

## 2014-04-23 NOTE — Progress Notes (Signed)
   Subjective:    Patient ID: Candace [name], female    DOB: March 11, 1922, 81 y.o.   MRN: [id number]  HPI Patient in c/o of foot pain. They have been hurting for 3 weeks- she says her hands hurt also- the feet are the worst- they feel like they are on fire all the time. She has no other complaints tody    Review of Systems  Constitutional: Negative.   HENT: Negative.   Respiratory: Negative.   Cardiovascular: Negative.   Gastrointestinal: Negative.   Genitourinary: Negative.   Neurological: Negative.   Psychiatric/Behavioral: Negative.   All other systems reviewed and are negative.      Objective:   Physical Exam  Constitutional: She is oriented to person, place, and time. She appears well-developed and well-nourished.  Cardiovascular: Normal rate, regular rhythm and normal heart sounds.   Pulmonary/Chest: Effort normal and breath sounds normal.  Neurological: She is alert and oriented to person, place, and time.  Skin: Skin is warm.  (+) monofilament bil feet  Psychiatric: She has a normal mood and affect. Her behavior is normal. Judgment and thought content normal.   BP 131/91 mmHg  Pulse 88  Temp(Src) 96.8 F (36 C) (Oral)  Ht 4\' 9"  (1.448 m)  Wt 88 lb (39.917 kg)  BMI 19.04 kg/m2        Assessment & Plan:  1. Atrial fibrillation - POCT INR  2. Neuropathy of both feet Keep feet warm - Gabapentin, Once-Daily, 300 MG TABS; Take 1 tablet by mouth at bedtime and may repeat dose one time if needed.  Dispense: 30 tablet; Refill: [place], FNP

## 2014-04-23 NOTE — Patient Instructions (Addendum)
Anticoagulation Dose Instructions as of 04/23/2014      Dorene Grebe Tue Wed Thu Fri Sat   New Dose 2 mg 3 mg 2 mg 2 mg 2 mg 3 mg 2 mg    Description        Continue current meds recheck in 4 weeks     Peripheral Neuropathy Peripheral neuropathy is a type of nerve damage. It affects nerves that carry signals between the spinal cord and other parts of the body. These are called peripheral nerves. With peripheral neuropathy, one nerve or a group of nerves may be damaged.  CAUSES  Many things can damage peripheral nerves. For some people with peripheral neuropathy, the cause is unknown. Some causes include:  Diabetes. This is the most common cause of peripheral neuropathy.  Injury to a nerve.  Pressure or stress on a nerve that lasts a long time.  Too little vitamin B. Alcoholism can lead to this.  Infections.  Autoimmune diseases, such as multiple sclerosis and systemic lupus erythematosus.  Inherited nerve diseases.  Some medicines, such as cancer drugs.  Toxic substances, such as lead and mercury.  Too little blood flowing to the legs.  Kidney disease.  Thyroid disease. SIGNS AND SYMPTOMS  Different people have different symptoms. The symptoms you have will depend on which of your nerves is damaged. Common symptoms include:  Loss of feeling (numbness) in the feet and hands.  Tingling in the feet and hands.  Pain that burns.  Very sensitive skin.  Weakness.  Not being able to move a part of the body (paralysis).  Muscle twitching.  Clumsiness or poor coordination.  Loss of balance.  Not being able to control your bladder.  Feeling dizzy.  Sexual problems. DIAGNOSIS  Peripheral neuropathy is a symptom, not a disease. Finding the cause of peripheral neuropathy can be hard. To figure that out, your health care provider will take a medical history and do a physical exam. A neurological exam will also be done. This involves checking things affected by your brain,  spinal cord, and nerves (nervous system). For example, your health care provider will check your reflexes, how you move, and what you can feel.  Other types of tests may also be ordered, such as:  Blood tests.  A test of the fluid in your spinal cord.  Imaging tests, such as CT scans or an MRI.  Electromyography (EMG). This test checks the nerves that control muscles.  Nerve conduction velocity tests. These tests check how fast messages pass through your nerves.  Nerve biopsy. A small piece of nerve is removed. It is then checked under a microscope. TREATMENT   Medicine is often used to treat peripheral neuropathy. Medicines may include:  Pain-relieving medicines. Prescription or over-the-counter medicine may be suggested.  Antiseizure medicine. This may be used for pain.  Antidepressants. These also may help ease pain from neuropathy.  Lidocaine. This is a numbing medicine. You might wear a patch or be given a shot.  Mexiletine. This medicine is typically used to help control irregular heart rhythms.  Surgery. Surgery may be needed to relieve pressure on a nerve or to destroy a nerve that is causing pain.  Physical therapy to help movement.  Assistive devices to help movement. HOME CARE INSTRUCTIONS   Only take over-the-counter or prescription medicines as directed by your health care provider. Follow the instructions carefully for any given medicines. Do not take any other medicines without first getting approval from your health care provider.  If you have diabetes, work closely with your health care provider to keep your blood sugar under control.  If you have numbness in your feet:  Check every day for signs of injury or infection. Watch for redness, warmth, and swelling.  Wear padded socks and comfortable shoes. These help protect your feet.  Do not do things that put pressure on your damaged nerve.  Do not smoke. Smoking keeps blood from getting to damaged  nerves.  Avoid or limit alcohol. Too much alcohol can cause a lack of B vitamins. These vitamins are needed for healthy nerves.  Develop a good support system. Coping with peripheral neuropathy can be stressful. Talk to a mental health specialist or join a support group if you are struggling.  Follow up with your health care provider as directed. SEEK MEDICAL CARE IF:   You have new signs or symptoms of peripheral neuropathy.  You are struggling emotionally from dealing with peripheral neuropathy.  You have a fever. SEEK IMMEDIATE MEDICAL CARE IF:   You have an injury or infection that is not healing.  You feel very dizzy or begin vomiting.  You have chest pain.  You have trouble breathing. Document Released: 12/15/2001 Document Revised: 09/06/2010 Document Reviewed: 09/01/2012 [facility] Patient Information 2015 [place], [place]. This information is not intended to replace advice given to you by your health care provider. Make sure you discuss any questions you have with your health care provider.

## 2014-04-27 ENCOUNTER — Telehealth: Payer: Self-pay

## 2014-04-27 DIAGNOSIS — G629 Polyneuropathy, unspecified: Secondary | ICD-10-CM

## 2014-04-27 NOTE — Telephone Encounter (Signed)
Medicare denied Gralise prior authorization

## 2014-04-27 NOTE — Telephone Encounter (Signed)
What will they pay for - that is just gabaentin

## 2014-04-28 MED ORDER — GABAPENTIN 300 MG PO CAPS: 300.0000 mg | ORAL_CAPSULE | Freq: Three times a day (TID) | ORAL | Status: DC

## 2014-04-28 NOTE — Telephone Encounter (Signed)
According to the denial Gralise is a anti cancer drug so I believe if you just write it as Gabapentin it might go through  Can we try that?

## 2014-04-29 NOTE — Telephone Encounter (Signed)
x

## 2014-05-24 ENCOUNTER — Ambulatory Visit (INDEPENDENT_AMBULATORY_CARE_PROVIDER_SITE_OTHER): Payer: Medicare Other | Admitting: Pharmacist

## 2014-05-24 DIAGNOSIS — Z7901 Long term (current) use of anticoagulants: Secondary | ICD-10-CM | POA: Diagnosis not present

## 2014-05-24 DIAGNOSIS — I82401 Acute embolism and thrombosis of unspecified deep veins of right lower extremity: Secondary | ICD-10-CM | POA: Diagnosis not present

## 2014-05-24 DIAGNOSIS — I4891 Unspecified atrial fibrillation: Secondary | ICD-10-CM | POA: Diagnosis not present

## 2014-05-24 DIAGNOSIS — I482 Chronic atrial fibrillation, unspecified: Secondary | ICD-10-CM

## 2014-05-24 LAB — POCT INR: INR: 1.6

## 2014-05-24 NOTE — Patient Instructions (Signed)
Anticoagulation Dose Instructions as of 05/24/2014      Candace [name] Tue Wed Thu Fri Sat   New Dose 2 mg 3 mg 2 mg 3 mg 2 mg 3 mg 2 mg    Description        Taek 2 tablets today - Monday, May 16th then increase dose to warfarin 2mg  take 1 and 1/2 tablets on mondays, wednesdays and fridays.  Take 1 tablet all other day.     INR was 1.6 today

## 2014-06-09 ENCOUNTER — Other Ambulatory Visit: Payer: Self-pay | Admitting: Pharmacist

## 2014-06-10 ENCOUNTER — Ambulatory Visit (INDEPENDENT_AMBULATORY_CARE_PROVIDER_SITE_OTHER): Payer: Medicare Other | Admitting: Physician Assistant

## 2014-06-10 ENCOUNTER — Encounter: Payer: Self-pay | Admitting: Physician Assistant

## 2014-06-10 VITALS — BP 121/78 | HR 81 | Temp 97.1°F | Ht <= 58 in | Wt 91.4 lb

## 2014-06-10 DIAGNOSIS — J069 Acute upper respiratory infection, unspecified: Secondary | ICD-10-CM | POA: Diagnosis not present

## 2014-06-10 DIAGNOSIS — L309 Dermatitis, unspecified: Secondary | ICD-10-CM

## 2014-06-10 MED ORDER — BENZONATATE 100 MG PO CAPS: 100.0000 mg | ORAL_CAPSULE | Freq: Two times a day (BID) | ORAL | Status: DC | PRN

## 2014-06-10 MED ORDER — VALACYCLOVIR HCL 1 G PO TABS: 1000.0000 mg | ORAL_TABLET | Freq: Three times a day (TID) | ORAL | Status: DC

## 2014-06-10 MED ORDER — AMOXICILLIN 500 MG PO CAPS: 500.0000 mg | ORAL_CAPSULE | Freq: Three times a day (TID) | ORAL | Status: DC

## 2014-06-10 NOTE — Progress Notes (Signed)
Subjective:     Patient ID: Candace [name], female   DOB: June 25, 1922, 81 y.o.   MRN: [id number]  HPI Pt with cough x 1 week Denies any fever/chills Cough now becoming productive Also with burning sensation to the L side of the neck Now has noticed rash that started yest  Review of Systems  Constitutional: Negative.   HENT: Positive for postnasal drip and sinus pressure.   Respiratory: Positive for cough. Negative for chest tightness and shortness of breath.   Cardiovascular: Negative.   Skin: Positive for rash.       Objective:   Physical Exam  Constitutional: She appears well-developed and well-nourished.  Cardiovascular: Normal rate.   Murmur heard. Irregular rthymn  Pulmonary/Chest: Breath sounds normal. No respiratory distress. She has no wheezes. She has no rales.  Skin:  Cluster of erythem lesion to the L lateral cerv region No definite vesicles or ulcerations  Nursing note and vitals reviewed.      Assessment:     Acute URI Derm- ? Early shingle outbreak    Plan:     Valtrex rx for possible shingles Amox and Tessalon for cough Keep regular f/u for anticoag check

## 2014-06-10 NOTE — Patient Instructions (Signed)

## 2014-06-25 DIAGNOSIS — L570 Actinic keratosis: Secondary | ICD-10-CM | POA: Diagnosis not present

## 2014-06-25 DIAGNOSIS — Z85828 Personal history of other malignant neoplasm of skin: Secondary | ICD-10-CM | POA: Diagnosis not present

## 2014-06-25 DIAGNOSIS — L821 Other seborrheic keratosis: Secondary | ICD-10-CM | POA: Diagnosis not present

## 2014-07-01 ENCOUNTER — Ambulatory Visit (INDEPENDENT_AMBULATORY_CARE_PROVIDER_SITE_OTHER): Payer: Medicare Other | Admitting: Pharmacist

## 2014-07-01 DIAGNOSIS — I482 Chronic atrial fibrillation, unspecified: Secondary | ICD-10-CM

## 2014-07-01 DIAGNOSIS — Z7901 Long term (current) use of anticoagulants: Secondary | ICD-10-CM

## 2014-07-01 DIAGNOSIS — I82401 Acute embolism and thrombosis of unspecified deep veins of right lower extremity: Secondary | ICD-10-CM | POA: Diagnosis not present

## 2014-07-01 DIAGNOSIS — I4891 Unspecified atrial fibrillation: Secondary | ICD-10-CM

## 2014-07-01 LAB — POCT INR: INR: 2.4

## 2014-07-01 NOTE — Patient Instructions (Signed)
Anticoagulation Dose Instructions as of 07/01/2014      Dorene Grebe Tue Wed Thu Fri Sat   New Dose 2 mg 3 mg 2 mg 3 mg 2 mg 3 mg 2 mg    Description        Continue current warfarin 2mg  dose - take 1 and 1/2 tablets on mondays, wednesdays and fridays.  Take 1 tablet all other day.     INR was 2.4 today

## 2014-07-17 ENCOUNTER — Ambulatory Visit (INDEPENDENT_AMBULATORY_CARE_PROVIDER_SITE_OTHER): Payer: Medicare Other | Admitting: Physician Assistant

## 2014-07-17 VITALS — BP 124/78 | HR 85 | Temp 97.0°F | Wt 90.4 lb

## 2014-07-17 DIAGNOSIS — D485 Neoplasm of uncertain behavior of skin: Secondary | ICD-10-CM

## 2014-07-17 DIAGNOSIS — R21 Rash and other nonspecific skin eruption: Secondary | ICD-10-CM

## 2014-07-17 DIAGNOSIS — D049 Carcinoma in situ of skin, unspecified: Secondary | ICD-10-CM | POA: Diagnosis not present

## 2014-07-17 NOTE — Progress Notes (Signed)
   Subjective:    Patient ID: Candace [name], female    DOB: [birth year], 81 y.o.   MRN: [id number]  HPI 81 y/o female presents with c/o rash on left side of neck that itches and burns. She was treated with antibiotic and valtrex for shingles with no relief. SHe has a h/o skin cancer (BCC/SCC), fair skin.     Review of Systems  Skin:       Itching and burning red "rash" on left side of neck       Objective:   Physical Exam  Constitutional: She is oriented to person, place, and time.  Neurological: She is alert and oriented to person, place, and time.  Skin:  Erythematous plaque on left side of neck, approximately 1cm in diameter, scaling  .31mm erythematous scaling patch on right cheek  Scaling areas and actinic changes on face, fair skin   Psychiatric: She has a normal mood and affect. Her behavior is normal. Judgment and thought content normal.  Nursing note and vitals reviewed.         Assessment & Plan:  1. Neoplasm of uncertain behavior of skin - shave biopsy performed of scaling lesion on right cheek and left neck, suspicious for BCC/SCC. Will await path to determine treatment plan. She is a patient at [facility] in [place] applied. Vaseline or TAO for healing   Kalel Harty A. Benjamin Stain PA-C

## 2014-07-21 ENCOUNTER — Telehealth: Payer: Self-pay | Admitting: Physician Assistant

## 2014-07-22 NOTE — Telephone Encounter (Signed)
We will call her when we get results of pathology.

## 2014-07-24 LAB — PATHOLOGY

## 2014-07-26 ENCOUNTER — Telehealth: Payer: Self-pay | Admitting: Physician Assistant

## 2014-07-26 NOTE — Telephone Encounter (Signed)
Candace [name] will you please call patient

## 2014-07-26 NOTE — Telephone Encounter (Signed)
Stp and reviewed biopsy results, pt would like results faxed to her Dermatologist at the [facility] which I have already done and pt will call to schedule her appt. The fax number for [facility] is [phone].

## 2014-08-02 ENCOUNTER — Ambulatory Visit (INDEPENDENT_AMBULATORY_CARE_PROVIDER_SITE_OTHER): Payer: Medicare Other | Admitting: Pharmacist

## 2014-08-02 DIAGNOSIS — I482 Chronic atrial fibrillation, unspecified: Secondary | ICD-10-CM

## 2014-08-02 DIAGNOSIS — Z23 Encounter for immunization: Secondary | ICD-10-CM

## 2014-08-02 DIAGNOSIS — Z7901 Long term (current) use of anticoagulants: Secondary | ICD-10-CM

## 2014-08-02 DIAGNOSIS — I82401 Acute embolism and thrombosis of unspecified deep veins of right lower extremity: Secondary | ICD-10-CM | POA: Diagnosis not present

## 2014-08-02 DIAGNOSIS — I4891 Unspecified atrial fibrillation: Secondary | ICD-10-CM

## 2014-08-02 LAB — POCT INR: INR: 2.1

## 2014-08-02 NOTE — Patient Instructions (Signed)
Anticoagulation Dose Instructions as of 08/02/2014      Dorene Grebe Tue Wed Thu Fri Sat   New Dose 2 mg 3 mg 2 mg 3 mg 2 mg 3 mg 2 mg    Description        Continue current warfarin 2mg  dose - take 1 and 1/2 tablets on mondays, wednesdays and fridays.  Take 1 tablet all other day.     INR was 2.1 today

## 2014-08-17 ENCOUNTER — Other Ambulatory Visit: Payer: Self-pay | Admitting: Nurse Practitioner

## 2014-08-27 DIAGNOSIS — L821 Other seborrheic keratosis: Secondary | ICD-10-CM | POA: Diagnosis not present

## 2014-08-27 DIAGNOSIS — D0439 Carcinoma in situ of skin of other parts of face: Secondary | ICD-10-CM | POA: Diagnosis not present

## 2014-08-27 DIAGNOSIS — L570 Actinic keratosis: Secondary | ICD-10-CM | POA: Diagnosis not present

## 2014-08-27 DIAGNOSIS — D044 Carcinoma in situ of skin of scalp and neck: Secondary | ICD-10-CM | POA: Diagnosis not present

## 2014-08-30 ENCOUNTER — Other Ambulatory Visit: Payer: Self-pay | Admitting: Pharmacist

## 2014-08-30 MED ORDER — DENOSUMAB 60 MG/ML ~~LOC~~ SOLN: SUBCUTANEOUS | Status: DC

## 2014-08-31 ENCOUNTER — Other Ambulatory Visit: Payer: Self-pay | Admitting: Nurse Practitioner

## 2014-08-31 ENCOUNTER — Telehealth: Payer: Self-pay | Admitting: Physician Assistant

## 2014-08-31 NOTE — Telephone Encounter (Signed)
Patient aware of results and verbalizes understanding. Results were also faxed to [facility] Dr. Redmond Pulling.

## 2014-09-01 ENCOUNTER — Telehealth: Payer: Self-pay | Admitting: Nurse Practitioner

## 2014-09-02 ENCOUNTER — Other Ambulatory Visit: Payer: Self-pay | Admitting: *Deleted

## 2014-09-02 MED ORDER — AMLODIPINE BESYLATE 2.5 MG PO TABS: 2.5000 mg | ORAL_TABLET | Freq: Every day | ORAL | Status: DC

## 2014-09-02 MED ORDER — METOPROLOL TARTRATE 25 MG PO TABS: 12.5000 mg | ORAL_TABLET | Freq: Two times a day (BID) | ORAL | Status: DC

## 2014-09-07 ENCOUNTER — Telehealth: Payer: Self-pay

## 2014-09-07 NOTE — Telephone Encounter (Signed)
Insurance authorized Prolia for 1 year

## 2014-09-08 ENCOUNTER — Telehealth: Payer: Self-pay | Admitting: Family Medicine

## 2014-09-08 NOTE — Telephone Encounter (Signed)
Patients daughter aware that I do not see where she has been called.

## 2014-09-20 ENCOUNTER — Ambulatory Visit (INDEPENDENT_AMBULATORY_CARE_PROVIDER_SITE_OTHER): Payer: Medicare Other | Admitting: Pharmacist

## 2014-09-20 DIAGNOSIS — I482 Chronic atrial fibrillation, unspecified: Secondary | ICD-10-CM

## 2014-09-20 DIAGNOSIS — I4891 Unspecified atrial fibrillation: Secondary | ICD-10-CM | POA: Diagnosis not present

## 2014-09-20 DIAGNOSIS — Z7901 Long term (current) use of anticoagulants: Secondary | ICD-10-CM

## 2014-09-20 DIAGNOSIS — M810 Age-related osteoporosis without current pathological fracture: Secondary | ICD-10-CM | POA: Diagnosis not present

## 2014-09-20 DIAGNOSIS — I82401 Acute embolism and thrombosis of unspecified deep veins of right lower extremity: Secondary | ICD-10-CM

## 2014-09-20 LAB — POCT INR: INR: 1.9

## 2014-09-20 MED ORDER — DENOSUMAB 60 MG/ML ~~LOC~~ SOLN
60.0000 mg | Freq: Once | SUBCUTANEOUS | Status: AC
  Administered 2014-09-20: 60 mg via SUBCUTANEOUS

## 2014-09-20 NOTE — Patient Instructions (Signed)
Anticoagulation Dose Instructions as of 09/20/2014      Candace [name] Tue Wed Thu Fri Sat   New Dose 2 mg 3 mg 2 mg 3 mg 2 mg 3 mg 2 mg    Description        Take 2 tablets instead of 1 and 1/2 today - Monday, September 12th, then continue current warfarin 2mg  dose - take 1 and 1/2 tablets on mondays, wednesdays and fridays.  Take 1 tablet all other day.     INR was 1.9 today

## 2014-09-20 NOTE — Progress Notes (Signed)
Patient ID: ILAH BOULE, female   DOB: 1922/07/23, 81 y.o.   MRN: [id number]  CC:  Osteoporosis and Atrial fibrillation   HPI: Patient with osteoporosis and history of vertebral fractures.  She has had vertebralplasty in past.   Positive kyphosis.  Has been getting Prolia 60mg  SQ q 6 month since around Feb 2013.  Last injection was 03/2014.  Has tolerated well.  She also have tried Reclast - tolerated OK, Forteo - caused cramping, Boniva - rash                                                             PMH: Age at menopause:  56's Hysterectomy?  No Oophorectomy?  No HRT? No Steroid Use?  No Thyroid med?  No History of cancer?  Yes - skin cancer History of digestive disorders (ie Crohn's)?  Yes - GERD  But no currently taking PPI Current or previous eating disorders?  No Last Vitamin D Result:  34 (2013) Last GFR Result:  37 (03/03/2014) Calcium = 9.0 (03/03/2014)   FH/SH: Family history of osteoporosis?  No Parent with history of hip fracture?  No Family history of breast cancer?  No Exercise?  Yes - walking daily Smoking?  No Alcohol?  No    Calcium Assessment Calcium Intake  # of servings/day  Calcium mg  Milk (8 oz) 0  x  300  = 0  Yogurt (4 oz) 0.5 x  200 = 100mg   Cheese (1 oz) 1 x  200 = 200mg   Other Calcium sources   250mg   Ca supplement 0 = 0   Estimated calcium intake per day 550mg     DEXA Results Date of Test T-Score for AP Spine L1-L4 T-Score for Total Left Hip T-Score for Total Right Hip  09/09/213 -4.6 -3.3 -3.6  01/10/2011 -4.8 -3.3 -3.6  03/24/2008 -4.5 -3.1 -3.3  01/28/2006 -4.8 -3.3 -3.2   INR today was 1.9  Assessment: Osteoporosis with history of vertebral fracture - BMD stable Sub therapeitic anticoagulation / Afib / DVT  Recommendations: 1.  Continue Prolia 60mg  SQ q 6 months - inject given in office today - patient supplied 2.  recommend calcium 1200mg  daily through supplementation or diet.  3.  continue weight bearing exercise -  30 minutes at least 4 days per week.   4.  Counseled and educated about fall risk and prevention. 5.   Anticoagulation Dose Instructions as of 09/20/2014      Dorene Grebe Tue Wed Thu Fri Sat   New Dose 2 mg 3 mg 2 mg 3 mg 2 mg 3 mg 2 mg    Description        Take 2 tablets instead of 1 and 1/2 today - Monday, September 12th, then continue current warfarin 2mg  dose - take 1 and 1/2 tablets on mondays, wednesdays and fridays.  Take 1 tablet all other day.     RTC in 4 weeks to recheck INR - will also check DEXA and labs at that visit  Time spent counseling patient:  20 minutes   Cherre Robins, PharmD, CPP

## 2014-09-28 DIAGNOSIS — C44329 Squamous cell carcinoma of skin of other parts of face: Secondary | ICD-10-CM | POA: Diagnosis not present

## 2014-09-28 DIAGNOSIS — D0439 Carcinoma in situ of skin of other parts of face: Secondary | ICD-10-CM | POA: Diagnosis not present

## 2014-09-28 DIAGNOSIS — D044 Carcinoma in situ of skin of scalp and neck: Secondary | ICD-10-CM | POA: Diagnosis not present

## 2014-09-28 DIAGNOSIS — D485 Neoplasm of uncertain behavior of skin: Secondary | ICD-10-CM | POA: Diagnosis not present

## 2014-10-20 ENCOUNTER — Ambulatory Visit (INDEPENDENT_AMBULATORY_CARE_PROVIDER_SITE_OTHER): Payer: Medicare Other

## 2014-10-20 ENCOUNTER — Ambulatory Visit (INDEPENDENT_AMBULATORY_CARE_PROVIDER_SITE_OTHER): Payer: Medicare Other | Admitting: Pharmacist

## 2014-10-20 ENCOUNTER — Encounter: Payer: Self-pay | Admitting: Pharmacist

## 2014-10-20 VITALS — BP 128/82 | HR 68 | Ht <= 58 in | Wt 92.0 lb

## 2014-10-20 DIAGNOSIS — Z7901 Long term (current) use of anticoagulants: Secondary | ICD-10-CM | POA: Diagnosis not present

## 2014-10-20 DIAGNOSIS — I82401 Acute embolism and thrombosis of unspecified deep veins of right lower extremity: Secondary | ICD-10-CM | POA: Diagnosis not present

## 2014-10-20 DIAGNOSIS — Z0000 Encounter for general adult medical examination without abnormal findings: Secondary | ICD-10-CM

## 2014-10-20 DIAGNOSIS — I4891 Unspecified atrial fibrillation: Secondary | ICD-10-CM

## 2014-10-20 DIAGNOSIS — M8000XD Age-related osteoporosis with current pathological fracture, unspecified site, subsequent encounter for fracture with routine healing: Secondary | ICD-10-CM | POA: Diagnosis not present

## 2014-10-20 LAB — POCT INR: INR: 1.7

## 2014-10-20 NOTE — Patient Instructions (Addendum)
Anticoagulation Dose Instructions as of 10/20/2014      Candace [name] Tue Wed Thu Fri Sat   New Dose 2 mg 3 mg 2 mg 3 mg 3 mg 3 mg 2 mg    Description        Take 2 tablets instead of 1 and 1/2 today - Wednesday, October 12th Then increase warfarin 2mg  dose - take 1 and 1/2 tablets on mondays, wednesdays, thursdays and fridays.  Take 1 tablet all other day.      INR was 1.7 today (too thick)   Candace [name] , Thank you for taking time to come for your Medicare Wellness Visit. I appreciate your ongoing commitment to your health goals. Please review the following plan we discussed and let me know if I can assist you in the future.   These are the goals we discussed:  Try to do chair exercises  Increase non-starchy vegetables - carrots, green bean, squash, zucchini, tomatoes, onions, peppers, spinach and other green leafy vegetables, cabbage, lettuce, cucumbers, asparagus, okra (not fried), eggplant limit sugar and processed foods (cakes, cookies, ice cream, crackers and chips) Increase fresh fruit but limit serving sizes 1/2 cup or about the size of tennis or baseball limit red meat to no more than 1-2 times per week (serving size about the size of your palm) Choose whole grains / lean proteins - whole wheat bread, quinoa, whole grain rice (1/2 cup), fish, chicken, [place]    This is a list of the screening recommended for you and due dates:  Health Maintenance  Topic Date Due  . Tetanus Vaccine  09/15/1943 - will check on cost and given at next visit  . Shingles Vaccine  09/14/1984  . Flu Shot  08/09/2014  . Pneumonia vaccines (2 of 2 - PCV13) 08/02/2015  . DEXA scan (bone density measurement)  Completed    Fall Prevention in the Home  Falls can cause injuries and can affect people from all age groups. There are many simple things that you can do to make your home safe and to help prevent falls. WHAT CAN I DO ON THE OUTSIDE OF MY HOME?  Regularly repair the edges of walkways and  driveways and fix any cracks.  Remove high doorway thresholds.  Trim any shrubbery on the main path into your home.  Use bright outdoor lighting.  Clear walkways of debris and clutter, including tools and rocks.  Regularly check that handrails are securely fastened and in good repair. Both sides of any steps should have handrails.  Install guardrails along the edges of any raised decks or porches.  Have leaves, snow, and ice cleared regularly.  Use sand or salt on walkways during winter months.  In the garage, clean up any spills right away, including grease or oil spills. WHAT CAN I DO IN THE BATHROOM?  Use night lights.  Install grab bars by the toilet and in the tub and shower. Do not use towel bars as grab bars.  Use non-skid mats or decals on the floor of the tub or shower.  If you need to sit down while you are in the shower, use a plastic, non-slip stool.Marland Kitchen  Keep the floor dry. Immediately clean up any water that spills on the floor.  Remove soap buildup in the tub or shower on a regular basis.  Attach bath mats securely with double-sided non-slip rug tape.  Remove throw rugs and other tripping hazards from the floor. WHAT CAN I DO IN THE BEDROOM?  Use night lights.  Make sure that a bedside light is easy to reach.  Do not use oversized bedding that drapes onto the floor.  Have a firm chair that has side arms to use for getting dressed.  Remove throw rugs and other tripping hazards from the floor. WHAT CAN I DO IN THE KITCHEN?   Clean up any spills right away.  Avoid walking on wet floors.  Place frequently used items in easy-to-reach places.  If you need to reach for something above you, use a sturdy step stool that has a grab bar.  Keep electrical cables out of the way.  Do not use floor polish or wax that makes floors slippery. If you have to use wax, make sure that it is non-skid floor wax.  Remove throw rugs and other tripping hazards from the  floor. WHAT CAN I DO IN THE STAIRWAYS?  Do not leave any items on the stairs.  Make sure that there are handrails on both sides of the stairs. Fix handrails that are broken or loose. Make sure that handrails are as long as the stairways.  Check any carpeting to make sure that it is firmly attached to the stairs. Fix any carpet that is loose or worn.  Avoid having throw rugs at the top or bottom of stairways, or secure the rugs with carpet tape to prevent them from moving.  Make sure that you have a light switch at the top of the stairs and the bottom of the stairs. If you do not have them, have them installed. WHAT ARE SOME OTHER FALL PREVENTION TIPS?  Wear closed-toe shoes that fit well and support your feet. Wear shoes that have rubber soles or low heels.  When you use a stepladder, make sure that it is completely opened and that the sides are firmly locked. Have someone hold the ladder while you are using it. Do not climb a closed stepladder.  Add color or contrast paint or tape to grab bars and handrails in your home. Place contrasting color strips on the first and last steps.  Use mobility aids as needed, such as canes, walkers, scooters, and crutches.  Turn on lights if it is dark. Replace any light bulbs that burn out.  Set up furniture so that there are clear paths. Keep the furniture in the same spot.  Fix any uneven floor surfaces.  Choose a carpet design that does not hide the edge of steps of a stairway.  Be aware of any and all pets.  Review your medicines with your healthcare provider. Some medicines can cause dizziness or changes in blood pressure, which increase your risk of falling. Talk with your health care provider about other ways that you can decrease your risk of falls. This may include working with a physical therapist or trainer to improve your strength, balance, and endurance.   This information is not intended to replace advice given to you by your health  care provider. Make sure you discuss any questions you have with your health care provider.   Document Released: 12/15/2001 Document Revised: 05/11/2014 Document Reviewed: 01/29/2014 Elsevier Interactive Patient Education [employer].

## 2014-10-20 NOTE — Progress Notes (Signed)
Patient ID: Candace [name], female   DOB: 1922-07-01, 81 y.o.   MRN: [id number]    Subjective:   BRYNNA [name] is a 81 y.o. widowed, white female who presents for an Initial Medicare Annual Wellness Visit, recheck protime and have DEXA rechecked.   Candace [name] has a history of osteoporosis with vertebral /  compression fracture.  She had kyphoplasty in past but last year in July 2015 she was evaluated for repeat kyphoplasty and it was determined she was not a candidate.  She has steroid injection instead back pain has been manageable since.   Patient took Forteo for 2 years and is now getting Prolia 60mg  SQ q 6 months.    Patient does not have any medical complaints today.   Current Medications (verified) Outpatient Encounter Prescriptions as of 10/20/2014  Medication Sig  . acetaminophen (TYLENOL) 500 MG tablet Take 500 mg by mouth as needed for pain.  Marland Kitchen amLODipine (NORVASC) 2.5 MG tablet Take 1 tablet (2.5 mg total) by mouth daily.  . bimatoprost (LUMIGAN) 0.01 % SOLN Place 1 drop into both eyes at bedtime.  . brimonidine (ALPHAGAN P) 0.1 % SOLN Apply 1 drop to eye 2 (two) times daily.   Marland Kitchen denosumab (PROLIA) 60 MG/ML SOLN injection INJECT 60 MG INTO THE SKIN EVERY 6 MONTHS. BRING TO OFFICE FOR ADMINSTRATION  . furosemide (LASIX) 20 MG tablet TAKE ONE-HALF TABLET BY MOUTH ONCE DAILY IN THE MORNING  . lisinopril (PRINIVIL,ZESTRIL) 40 MG tablet TAKE ONE TABLET BY MOUTH ONCE DAILY  . metoprolol tartrate (LOPRESSOR) 25 MG tablet Take 0.5 tablets (12.5 mg total) by mouth 2 (two) times daily.  . simvastatin (ZOCOR) 40 MG tablet Take 1 tablet (40 mg total) by mouth at bedtime.  Marland Kitchen warfarin (COUMADIN) 2 MG tablet TAKE ONE TABLET BY MOUTH ONCE DAILY. TAKE ONE OR TWO TABLETS DAILY AS INSTRUCTED BY ANTICOAGULATION   No facility-administered encounter medications on file as of 10/20/2014.    Allergies (verified) Codeine; Fosamax; Sulfa antibiotics; Tramadol; Boniva; Septra; and Vioxx    History: Past Medical History  Diagnosis Date  . Hypertension   . DVT (deep venous thrombosis) ([place])   . PE (pulmonary embolism)   . Hypercholesterolemia   . Atrial fibrillation ([place])   . Glaucoma   . Cancer [facility]) 2016    skin   Past Surgical History  Procedure Laterality Date  . Cholecystectomy    . Kyphoplasty    . Skin cancer excision Left 2016    neck   Family History  Problem Relation Age of Onset  . Stroke Father   . Heart attack Mother   . Diabetes Brother   . Heart disease Brother   . Early death Sister    Social History   Occupational History  .     Social History Main Topics  . Smoking status: Never Smoker   . Smokeless tobacco: Current User    Types: Snuff  . Alcohol Use: No  . Drug Use: No  . Sexual Activity: Not on file    Do you feel safe at home?  Yes  Dietary issues and exercise activities: Current Exercise Habits:: Home exercise routine, Type of exercise: walking, Time (Minutes): 10, Frequency (Times/Week): 4, Weekly Exercise (Minutes/Week): 40, Intensity: Mild   Current Dietary habits:  She doesn't eat much meat due to concerns of chocking.  She does eat lots of vegetables and fish.    Objective:    Today's Vitals   10/20/14 1531  BP: 128/82  Pulse: 68  Height: 4' 8.5" (1.435 m)  Weight: 92 lb (41.731 kg)   Body mass index is 20.27 kg/(m^2).   INR was 1.7 today   DEXA Results Date of Test T-Score for AP Spine L1-L4 T-Score for Total Left Hip T-Score for Total Right Hip  10/20/2014 -5.4 -3.1 -3.4  09/09/213 -4.6 -3.3 -3.6  01/10/2011 -4.8 -3.3 -3.6  03/24/2008 -4.5 -3.1 -3.3  01/28/2006 -4.8 -3.3 -3.2           Activities of Daily Living In your present state of health, do you have any difficulty performing the following activities: 10/20/2014 01/28/2014  Hearing? N N  Vision? Y N  Difficulty concentrating or making decisions? N N  Walking or climbing stairs? N N  Dressing or bathing? N N   Doing errands, shopping? Y N  Preparing Food and eating ? N -  Using the Toilet? N -  In the past six months, have you accidently leaked urine? N -  Do you have problems with loss of bowel control? N -  Managing your Medications? N -  Managing your Finances? N -  Housekeeping or managing your Housekeeping? N -    Are there smokers in your home (other than you)? No   Cardiac Risk Factors include: advanced age (>34men, >6 women);dyslipidemia;hypertension  Depression Screen PHQ 2/9 Scores 10/20/2014 06/04/2013 05/15/2013 05/15/2013  PHQ - 2 Score 0 1 0 0    Fall Risk Fall Risk  10/20/2014 06/10/2014 06/04/2013 05/15/2013 05/15/2013  Falls in the past year? No No No No No    Cognitive Function: MMSE - Mini Mental State Exam 10/20/2014  Not completed: Unable to complete- patient does not read well  Orientation to time 5  Orientation to Place 5  Registration 3  Attention/ Calculation 3  Recall 3  Language- name 2 objects 2    Immunizations and Health Maintenance Immunization History  Administered Date(s) Administered  . Pneumococcal Polysaccharide-23 08/02/2014   Health Maintenance Due  Topic Date Due  . TETANUS/TDAP  09/15/1943  . ZOSTAVAX  09/14/1984    Patient Care Team: Chevis Pretty, [place] as PCP - General (Nurse Practitioner)  [facility] (patient could not remember Dr's name)  Indicate any recent Medical Services you may have received from other than [facility] providers in the past year (date may be approximate).    Assessment:    Annual Wellness Visit  Severe osteoporosis with history of vertebral fractures.  Subtherapeutic anticoagulation.   Screening Tests Health Maintenance  Topic Date Due  . TETANUS/TDAP  09/15/1943  . ZOSTAVAX  09/14/1984  . INFLUENZA VACCINE  09/09/2015 (Originally 08/09/2014)  . PNA vac Low Risk Adult (2 of 2 - PCV13) 08/02/2015  . DEXA SCAN  09/10/2015        Plan:   During the course of the visit Shonte was educated  and counseled about the following appropriate screening and preventive services:   Vaccines to include Pneumoccal, Influenza, Hepatitis B, Td, Zostavax - Patient has reaction to influenza vaccines in past and does not get any more.  Checked price of Boostrix / Tdap;  Patient declined Zostavax  Colorectal cancer screening - patient 81 yo - no longer required  Cardiovascular disease screening - BP at goal and LDL at goal.  Patient is taking statin regularly  Diabetes screening - BG was elevated at 117 in Feb but patient was not fasting  Bone Denisty / Osteoporosis Screening - DEXA rechecked today due to severe osteoporosis and Prolia  therapy.    Fall prevention discussed  Start calcium 500mg  1 tablet daily  Check vitamin D with next labs  Mammogram - patient declines  Glaucoma screening / Eye Exam - UTD  Nutrition counseling - continue to eat fish and vegetables.    Tobacco cessation counseling - Discussed risk of oral tobacco use.  Patient understands and is not interested in quitting at this time  Advanced Directives - UTD  Discussed chair exercises and hand out given  Anticoagulation Dose Instructions as of 10/20/2014      Dorene Grebe Tue Wed Thu Fri Sat   New Dose 2 mg 3 mg 2 mg 3 mg 3 mg 3 mg 2 mg    Description        Take 2 tablets instead of 1 and 1/2 today - Wednesday, October 12th Then increase warfarin 2mg  dose - take 1 and 1/2 tablets on mondays, wednesdays, thursdays and fridays.  Take 1 tablet all other day.     appt with PCP made for 11/2014 for f/o chronic conditions and labs F/U with me in December 2016 for Protime   Patient Instructions (the written plan) were given to the patient.   Cherre Robins, [facility]   10/20/2014

## 2014-10-25 ENCOUNTER — Other Ambulatory Visit: Payer: Self-pay | Admitting: Nurse Practitioner

## 2014-11-13 ENCOUNTER — Other Ambulatory Visit: Payer: Self-pay | Admitting: Nurse Practitioner

## 2014-11-23 ENCOUNTER — Encounter: Payer: Self-pay | Admitting: Nurse Practitioner

## 2014-11-23 ENCOUNTER — Ambulatory Visit (INDEPENDENT_AMBULATORY_CARE_PROVIDER_SITE_OTHER): Payer: Medicare Other | Admitting: Nurse Practitioner

## 2014-11-23 VITALS — BP 130/88 | HR 97 | Temp 98.5°F | Ht <= 58 in | Wt 95.0 lb

## 2014-11-23 DIAGNOSIS — F17200 Nicotine dependence, unspecified, uncomplicated: Secondary | ICD-10-CM | POA: Diagnosis not present

## 2014-11-23 DIAGNOSIS — I4891 Unspecified atrial fibrillation: Secondary | ICD-10-CM | POA: Diagnosis not present

## 2014-11-23 DIAGNOSIS — M8000XD Age-related osteoporosis with current pathological fracture, unspecified site, subsequent encounter for fracture with routine healing: Secondary | ICD-10-CM

## 2014-11-23 DIAGNOSIS — Z7901 Long term (current) use of anticoagulants: Secondary | ICD-10-CM

## 2014-11-23 DIAGNOSIS — I10 Essential (primary) hypertension: Secondary | ICD-10-CM

## 2014-11-23 DIAGNOSIS — I82401 Acute embolism and thrombosis of unspecified deep veins of right lower extremity: Secondary | ICD-10-CM

## 2014-11-23 DIAGNOSIS — E785 Hyperlipidemia, unspecified: Secondary | ICD-10-CM

## 2014-11-23 LAB — POCT INR: INR: 4.6

## 2014-11-23 MED ORDER — METOPROLOL TARTRATE 25 MG PO TABS: 12.5000 mg | ORAL_TABLET | Freq: Two times a day (BID) | ORAL | Status: DC

## 2014-11-23 MED ORDER — WARFARIN SODIUM 2 MG PO TABS: ORAL_TABLET | ORAL | Status: DC

## 2014-11-23 MED ORDER — LISINOPRIL 40 MG PO TABS: 40.0000 mg | ORAL_TABLET | Freq: Every day | ORAL | Status: DC

## 2014-11-23 MED ORDER — AMLODIPINE BESYLATE 2.5 MG PO TABS: 2.5000 mg | ORAL_TABLET | Freq: Every day | ORAL | Status: DC

## 2014-11-23 MED ORDER — SIMVASTATIN 40 MG PO TABS: 40.0000 mg | ORAL_TABLET | Freq: Every day | ORAL | Status: DC

## 2014-11-23 MED ORDER — FUROSEMIDE 20 MG PO TABS: ORAL_TABLET | ORAL | Status: DC

## 2014-11-23 NOTE — Patient Instructions (Signed)
Fall Prevention in the Home  Falls can cause injuries and can affect people from all age groups. There are many simple things that you can do to make your home safe and to help prevent falls. WHAT CAN I DO ON THE OUTSIDE OF MY HOME?  Regularly repair the edges of walkways and driveways and fix any cracks.  Remove high doorway thresholds.  Trim any shrubbery on the main path into your home.  Use bright outdoor lighting.  Clear walkways of debris and clutter, including tools and rocks.  Regularly check that handrails are securely fastened and in good repair. Both sides of any steps should have handrails.  Install guardrails along the edges of any raised decks or porches.  Have leaves, snow, and ice cleared regularly.  Use sand or salt on walkways during winter months.  In the garage, clean up any spills right away, including grease or oil spills. WHAT CAN I DO IN THE BATHROOM?  Use night lights.  Install grab bars by the toilet and in the tub and shower. Do not use towel bars as grab bars.  Use non-skid mats or decals on the floor of the tub or shower.  If you need to sit down while you are in the shower, use a plastic, non-slip stool..  Keep the floor dry. Immediately clean up any water that spills on the floor.  Remove soap buildup in the tub or shower on a regular basis.  Attach bath mats securely with double-sided non-slip rug tape.  Remove throw rugs and other tripping hazards from the floor. WHAT CAN I DO IN THE BEDROOM?  Use night lights.  Make sure that a bedside light is easy to reach.  Do not use oversized bedding that drapes onto the floor.  Have a firm chair that has side arms to use for getting dressed.  Remove throw rugs and other tripping hazards from the floor. WHAT CAN I DO IN THE KITCHEN?   Clean up any spills right away.  Avoid walking on wet floors.  Place frequently used items in easy-to-reach places.  If you need to reach for something  above you, use a sturdy step stool that has a grab bar.  Keep electrical cables out of the way.  Do not use floor polish or wax that makes floors slippery. If you have to use wax, make sure that it is non-skid floor wax.  Remove throw rugs and other tripping hazards from the floor. WHAT CAN I DO IN THE STAIRWAYS?  Do not leave any items on the stairs.  Make sure that there are handrails on both sides of the stairs. Fix handrails that are broken or loose. Make sure that handrails are as long as the stairways.  Check any carpeting to make sure that it is firmly attached to the stairs. Fix any carpet that is loose or worn.  Avoid having throw rugs at the top or bottom of stairways, or secure the rugs with carpet tape to prevent them from moving.  Make sure that you have a light switch at the top of the stairs and the bottom of the stairs. If you do not have them, have them installed. WHAT ARE SOME OTHER FALL PREVENTION TIPS?  Wear closed-toe shoes that fit well and support your feet. Wear shoes that have rubber soles or low heels.  When you use a stepladder, make sure that it is completely opened and that the sides are firmly locked. Have someone hold the ladder while you   are using it. Do not climb a closed stepladder.  Add color or contrast paint or tape to grab bars and handrails in your home. Place contrasting color strips on the first and last steps.  Use mobility aids as needed, such as canes, walkers, scooters, and crutches.  Turn on lights if it is dark. Replace any light bulbs that burn out.  Set up furniture so that there are clear paths. Keep the furniture in the same spot.  Fix any uneven floor surfaces.  Choose a carpet design that does not hide the edge of steps of a stairway.  Be aware of any and all pets.  Review your medicines with your healthcare provider. Some medicines can cause dizziness or changes in blood pressure, which increase your risk of falling. Talk  with your health care provider about other ways that you can decrease your risk of falls. This may include working with a physical therapist or trainer to improve your strength, balance, and endurance.   This information is not intended to replace advice given to you by your health care provider. Make sure you discuss any questions you have with your health care provider.   Document Released: 12/15/2001 Document Revised: 05/11/2014 Document Reviewed: 01/29/2014 Elsevier Interactive Patient Education 2016 Elsevier Inc[employer]  

## 2014-11-23 NOTE — Progress Notes (Signed)
Subjective:    Patient ID: Candace [name], female    DOB: [birth year], 81 y.o.   MRN: [id number]   Patient here today for follow up of chronic medical problems.   Hypertension This is a chronic problem. The current episode started more than 1 year ago. The problem is unchanged. The problem is controlled. Risk factors for coronary artery disease include dyslipidemia and post-menopausal state. Past treatments include ACE inhibitors and calcium channel blockers. The current treatment provides moderate improvement. Compliance problems include diet and exercise.   Hyperlipidemia This is a chronic problem. The current episode started more than 1 year ago. The problem is controlled. Recent lipid tests were reviewed and are normal. Current antihyperlipidemic treatment includes statins. The current treatment provides moderate improvement of lipids. Compliance problems include adherence to diet and adherence to exercise.  Risk factors for coronary artery disease include dyslipidemia, hypertension and post-menopausal.  atrial fib Chronic coumadin therapy- will do INR today- no c/o palpiutations Osteoporosis with compression fracture prolia works well.  * weight is up 7 lbs since January    Review of Systems  Constitutional: Negative.   HENT: Negative.   Respiratory: Negative.   Cardiovascular: Negative.   Gastrointestinal: Negative.   Genitourinary: Negative.   Neurological: Negative.   Psychiatric/Behavioral: Negative.   All other systems reviewed and are negative.      Objective:   Physical Exam  Constitutional: She is oriented to person, place, and time. She appears well-developed and well-nourished.  HENT:  Nose: Nose normal.  Mouth/Throat: Oropharynx is clear and moist.  Eyes: EOM are normal.  Neck: Trachea normal, normal range of motion and full passive range of motion without pain. Neck supple. No JVD present. Carotid bruit is not present. No thyromegaly present.    Cardiovascular: Normal rate, regular rhythm, normal heart sounds and intact distal pulses.  Exam reveals no gallop and no friction rub.   No murmur heard. Pulmonary/Chest: Effort normal and breath sounds normal.  Abdominal: Soft. Bowel sounds are normal. She exhibits no distension and no mass. There is no tenderness.  Musculoskeletal: Normal range of motion.  Lymphadenopathy:    She has no cervical adenopathy.  Neurological: She is alert and oriented to person, place, and time. She has normal reflexes.  Skin: Skin is warm and dry.  Keratotic lesion on right wrist  Psychiatric: She has a normal mood and affect. Her behavior is normal. Judgment and thought content normal.    Cryotherapy to lesion on right wrist  BP 130/88 mmHg  Pulse 97  Temp(Src) 98.5 F (36.9 C) (Oral)  Ht 4' 8.5" (1.435 m)  Wt 95 lb (43.092 kg)  BMI 20.93 kg/m2       Assessment & Plan:  1. Atrial fibrillation, unspecified type ([place]) Warfarin as discussed- hold warfarin today and tomorrow and recheck on thursday - warfarin (COUMADIN) 2 MG tablet; TAKE ONE TABLET BY MOUTH ONCE DAILY. TAKE ONE OR TWO TABLETS DAILY AS INSTRUCTED BY ANTICOAGULATION  Dispense: 90 tablet; Refill: 1 - metoprolol tartrate (LOPRESSOR) 25 MG tablet; Take 0.5 tablets (12.5 mg total) by mouth 2 (two) times daily.  Dispense: 90 tablet; Refill: 1  2. Essential hypertension Do not add salt to diet - CMP14+EGFR - Lipid panel - VITAMIN D 25 Hydroxy (Vit-D Deficiency, Fractures) - lisinopril (PRINIVIL,ZESTRIL) 40 MG tablet; Take 1 tablet (40 mg total) by mouth daily.  Dispense: 90 tablet; Refill: 1 - furosemide (LASIX) 20 MG tablet; TAKE ONE-HALF TABLET BY MOUTH ONCE DAILY IN THE MORNING  WITH  A  BANANA  Dispense: 90 tablet; Refill: 1 - amLODipine (NORVASC) 2.5 MG tablet; Take 1 tablet (2.5 mg total) by mouth daily.  Dispense: 90 tablet; Refill: 0  3. Osteoporosis with pathological fracture, with routine healing, subsequent encounter  4.  Long term current use of anticoagulant therapy  5. Tobacco use disorder Smoking cessation discussed  6. Hyperlipidemia with target LDL less than 100 Low fat diet - simvastatin (ZOCOR) 40 MG tablet; Take 1 tablet (40 mg total) by mouth at bedtime.  Dispense: 90 tablet; Refill: 1   Labs pending Health maintenance reviewed Diet and exercise encouraged Continue all meds Follow up  In 3 months   [place], FNP

## 2014-11-23 NOTE — Addendum Note (Signed)
Addended by: Earlene Plater on: 11/23/2014 04:17 PM   Modules accepted: Miquel Dunn

## 2014-11-24 ENCOUNTER — Telehealth: Payer: Self-pay | Admitting: Pharmacist

## 2014-11-24 LAB — CMP14+EGFR
A/G RATIO: 1.6 (ref 1.1–2.5)
ALT: 6 IU/L (ref 0–32)
AST: 12 IU/L (ref 0–40)
Albumin: 3.9 g/dL (ref 3.2–4.6)
Alkaline Phosphatase: 51 IU/L (ref 39–117)
BUN/Creatinine Ratio: 11 (ref 11–26)
BUN: 13 mg/dL (ref 10–36)
Bilirubin Total: 0.4 mg/dL (ref 0.0–1.2)
CALCIUM: 8.6 mg/dL — AB (ref 8.7–10.3)
CHLORIDE: 101 mmol/L (ref 97–106)
CO2: 27 mmol/L (ref 18–29)
CREATININE: 1.19 mg/dL — AB (ref 0.57–1.00)
GFR, EST AFRICAN AMERICAN: 46 mL/min/{1.73_m2} — AB (ref >59)
GFR, EST NON AFRICAN AMERICAN: 40 mL/min/{1.73_m2} — AB (ref >59)
GLOBULIN, TOTAL: 2.4 g/dL (ref 1.5–4.5)
Glucose: 131 mg/dL — ABNORMAL HIGH (ref 65–99)
Potassium: 3.2 mmol/L — ABNORMAL LOW (ref 3.5–5.2)
SODIUM: 145 mmol/L — AB (ref 136–144)
TOTAL PROTEIN: 6.3 g/dL (ref 6.0–8.5)

## 2014-11-24 LAB — LIPID PANEL
CHOLESTEROL TOTAL: 197 mg/dL (ref 100–199)
Chol/HDL Ratio: 3.9 ratio units (ref 0.0–4.4)
HDL: 50 mg/dL (ref >39)
LDL Calculated: 102 mg/dL — ABNORMAL HIGH (ref 0–99)
TRIGLYCERIDES: 223 mg/dL — AB (ref 0–149)
VLDL CHOLESTEROL CAL: 45 mg/dL — AB (ref 5–40)

## 2014-11-24 LAB — COMMENT

## 2014-11-24 LAB — VITAMIN D 25 HYDROXY (VIT D DEFICIENCY, FRACTURES): Vit D, 25-Hydroxy: 16.9 ng/mL — ABNORMAL LOW (ref 30.0–100.0)

## 2014-11-24 NOTE — Telephone Encounter (Signed)
Patient's INR was 4.6 yesterday.  She is to hold warfarin for 2 days.  She is suppose to come in tomorrow 11/25/14 and have rechecked.

## 2014-11-25 ENCOUNTER — Ambulatory Visit (INDEPENDENT_AMBULATORY_CARE_PROVIDER_SITE_OTHER): Payer: Medicare Other | Admitting: Pharmacist

## 2014-11-25 ENCOUNTER — Other Ambulatory Visit (INDEPENDENT_AMBULATORY_CARE_PROVIDER_SITE_OTHER): Payer: Medicare Other

## 2014-11-25 DIAGNOSIS — I82401 Acute embolism and thrombosis of unspecified deep veins of right lower extremity: Secondary | ICD-10-CM | POA: Diagnosis not present

## 2014-11-25 DIAGNOSIS — I4891 Unspecified atrial fibrillation: Secondary | ICD-10-CM

## 2014-11-25 DIAGNOSIS — Z7901 Long term (current) use of anticoagulants: Secondary | ICD-10-CM

## 2014-11-25 LAB — POCT INR: INR: 2.1

## 2014-11-25 NOTE — Patient Instructions (Signed)
Anticoagulation Dose Instructions as of 11/25/2014      Candace [name] Tue Wed Thu Fri Sat   New Dose 2 mg 2 mg 2 mg 3 mg 2 mg 2 mg 2 mg    Description        Change warfarin to new dose - take 1 and 1/2 tablets on Wednesdays and take 1 tablet all other days.     INR was 2.1 today

## 2014-11-25 NOTE — Progress Notes (Signed)
Lab only 

## 2014-11-30 DIAGNOSIS — H353134 Nonexudative age-related macular degeneration, bilateral, advanced atrophic with subfoveal involvement: Secondary | ICD-10-CM | POA: Diagnosis not present

## 2014-11-30 DIAGNOSIS — H409 Unspecified glaucoma: Secondary | ICD-10-CM | POA: Diagnosis not present

## 2014-12-15 ENCOUNTER — Ambulatory Visit (INDEPENDENT_AMBULATORY_CARE_PROVIDER_SITE_OTHER): Payer: Medicare Other | Admitting: Pharmacist

## 2014-12-15 DIAGNOSIS — I82401 Acute embolism and thrombosis of unspecified deep veins of right lower extremity: Secondary | ICD-10-CM

## 2014-12-15 DIAGNOSIS — I4891 Unspecified atrial fibrillation: Secondary | ICD-10-CM | POA: Diagnosis not present

## 2014-12-15 DIAGNOSIS — Z7901 Long term (current) use of anticoagulants: Secondary | ICD-10-CM | POA: Diagnosis not present

## 2014-12-15 LAB — POCT INR: INR: 2.2

## 2014-12-15 NOTE — Patient Instructions (Signed)
Anticoagulation Dose Instructions as of 12/15/2014      Dorene Grebe Tue Wed Thu Fri Sat   New Dose 2 mg 2 mg 2 mg 3 mg 2 mg 2 mg 2 mg    Description        Continue current warfarin dose of take 1 and 1/2 tablets on Wednesdays and take 1 tablet all other days.     INR was 2.2 today

## 2014-12-27 ENCOUNTER — Encounter: Payer: Self-pay | Admitting: Pharmacist

## 2015-01-20 ENCOUNTER — Ambulatory Visit (INDEPENDENT_AMBULATORY_CARE_PROVIDER_SITE_OTHER): Payer: Medicare Other | Admitting: Pediatrics

## 2015-01-20 VITALS — BP 134/85 | HR 80 | Temp 96.8°F | Ht <= 58 in | Wt 94.8 lb

## 2015-01-20 DIAGNOSIS — D489 Neoplasm of uncertain behavior, unspecified: Secondary | ICD-10-CM | POA: Diagnosis not present

## 2015-01-20 NOTE — Progress Notes (Signed)
    Subjective:    Patient ID: Candace [name], female    DOB: June 21, 1922, 82 y.o.   MRN: [id number]  CC: Cyst   HPI: Candace [name] is a 82 y.o. female presenting for Cyst  Has two spots on L cheek that she has noticed over the past several weeks H/o SCC, has had multiple surgeries to remove small skin cancers, mostly on neck Does not itch or bleed No drainage Has been trying to call a dermatologist but hasnt been able to get through on the phone  Depression screen [facility] 2/9 10/20/2014 06/04/2013 05/15/2013 05/15/2013 01/28/2013  Decreased Interest 0 0 0 0 0  Down, Depressed, Hopeless 0 1 0 0 0  PHQ - 2 Score 0 1 0 0 0     Relevant past medical, surgical, family and social history reviewed and updated as indicated. Interim medical history since our last visit reviewed. Allergies and medications reviewed and updated.    ROS: Per HPI unless specifically indicated above  History  Smoking status  . Never Smoker   Smokeless tobacco  . Current User  . Types: Snuff    Past Medical History Patient Active Problem List   Diagnosis Date Noted  . Tobacco use disorder 01/28/2014  . Osteoporosis with pathological fracture 08/13/2012  . DVT (deep venous thrombosis), right 05/13/2012  . HTN (hypertension) 05/13/2012  . Long term current use of anticoagulant therapy 05/13/2012  . Atrial fibrillation ([place]) 04/15/2012        Objective:    BP 134/85 mmHg  Pulse 80  Temp(Src) 96.8 F (36 C) (Oral)  Ht 4' 8.5" (1.435 m)  Wt 94 lb 12.8 oz (43.001 kg)  BMI 20.88 kg/m2  Wt Readings from Last 3 Encounters:  01/20/15 94 lb 12.8 oz (43.001 kg)  11/23/14 95 lb (43.092 kg)  10/20/14 92 lb (41.731 kg)     Gen: NAD, alert, cooperative with exam, NCAT EYES: EOMI, no scleral injection or icterus LYMPH: no cervical LAD CV: WWP Resp: normal WOB Ext: No edema, warm Neuro: Alert and oriented Skin: L cheek with verucous papule extending out apprx 4 mm with smooth pink raised border,  apprx 68mm in diameter. Also with flat topped flesh colored papule apprx 21mm by 58mm, no erythema. Verucous papule 75mm diameter, 7mm extending from skin on upper chest.     Assessment & Plan:    Candace [name] was seen today for multiple skin lesions. Concern for skin cancer with the verucous papule with smooth border. WIll refer to dermatology for evalution given location on L cheek.  Diagnoses and all orders for this visit:  Neoplasm of uncertain behavior -     Ambulatory referral to Dermatology    Follow up plan: As scheduled  Candace Found, MD [facility] 01/20/2015, 6:11 PM  Call Pat at [phone]

## 2015-01-24 ENCOUNTER — Ambulatory Visit (INDEPENDENT_AMBULATORY_CARE_PROVIDER_SITE_OTHER): Payer: Medicare Other | Admitting: Pharmacist

## 2015-01-24 DIAGNOSIS — Z7901 Long term (current) use of anticoagulants: Secondary | ICD-10-CM | POA: Diagnosis not present

## 2015-01-24 DIAGNOSIS — I4891 Unspecified atrial fibrillation: Secondary | ICD-10-CM | POA: Diagnosis not present

## 2015-01-24 DIAGNOSIS — I82401 Acute embolism and thrombosis of unspecified deep veins of right lower extremity: Secondary | ICD-10-CM | POA: Diagnosis not present

## 2015-01-24 LAB — POCT INR: INR: 2.6

## 2015-01-24 NOTE — Patient Instructions (Signed)
Anticoagulation Dose Instructions as of 01/24/2015      Candace [name] Tue Wed Thu Fri Sat   New Dose 2 mg 2 mg 2 mg 3 mg 2 mg 2 mg 2 mg    Description        Continue current warfarin dose of take 1 and 1/2 tablets on Wednesdays and take 1 tablet all other days.     INR was 2.6 today

## 2015-02-03 DIAGNOSIS — X32XXXA Exposure to sunlight, initial encounter: Secondary | ICD-10-CM | POA: Diagnosis not present

## 2015-02-03 DIAGNOSIS — C44329 Squamous cell carcinoma of skin of other parts of face: Secondary | ICD-10-CM | POA: Diagnosis not present

## 2015-02-03 DIAGNOSIS — L570 Actinic keratosis: Secondary | ICD-10-CM | POA: Diagnosis not present

## 2015-02-03 DIAGNOSIS — D225 Melanocytic nevi of trunk: Secondary | ICD-10-CM | POA: Diagnosis not present

## 2015-02-03 DIAGNOSIS — C44320 Squamous cell carcinoma of skin of unspecified parts of face: Secondary | ICD-10-CM | POA: Diagnosis not present

## 2015-02-24 ENCOUNTER — Other Ambulatory Visit: Payer: Self-pay | Admitting: Pharmacist

## 2015-02-24 MED ORDER — DENOSUMAB 60 MG/ML ~~LOC~~ SOLN: SUBCUTANEOUS | Status: DC

## 2015-03-07 ENCOUNTER — Encounter: Payer: Self-pay | Admitting: Pharmacist

## 2015-03-07 DIAGNOSIS — Z85828 Personal history of other malignant neoplasm of skin: Secondary | ICD-10-CM | POA: Diagnosis not present

## 2015-03-07 DIAGNOSIS — X32XXXD Exposure to sunlight, subsequent encounter: Secondary | ICD-10-CM | POA: Diagnosis not present

## 2015-03-07 DIAGNOSIS — L570 Actinic keratosis: Secondary | ICD-10-CM | POA: Diagnosis not present

## 2015-03-07 DIAGNOSIS — Z08 Encounter for follow-up examination after completed treatment for malignant neoplasm: Secondary | ICD-10-CM | POA: Diagnosis not present

## 2015-03-09 ENCOUNTER — Ambulatory Visit (INDEPENDENT_AMBULATORY_CARE_PROVIDER_SITE_OTHER): Payer: Medicare Other | Admitting: Pharmacist

## 2015-03-09 DIAGNOSIS — Z7901 Long term (current) use of anticoagulants: Secondary | ICD-10-CM | POA: Diagnosis not present

## 2015-03-09 DIAGNOSIS — I4891 Unspecified atrial fibrillation: Secondary | ICD-10-CM | POA: Diagnosis not present

## 2015-03-09 DIAGNOSIS — I82401 Acute embolism and thrombosis of unspecified deep veins of right lower extremity: Secondary | ICD-10-CM

## 2015-03-09 LAB — POCT INR: INR: 1.9

## 2015-03-09 NOTE — Patient Instructions (Signed)
Anticoagulation Dose Instructions as of 03/09/2015      Dorene Grebe Tue Wed Thu Fri Sat   New Dose 2 mg 2 mg 2 mg 3 mg 2 mg 2 mg 2 mg    Description        Take 2 tablets = 4mg  today only - Wednesday, March 1st. Then continue current warfarin dose of take 1 and 1/2 tablets on Wednesdays and take 1 tablet all other days.     INR was 1.9 today

## 2015-03-29 ENCOUNTER — Telehealth: Payer: Self-pay

## 2015-03-29 MED ORDER — BIMATOPROST 0.01 % OP SOLN: 1.0000 [drp] | Freq: Every day | OPHTHALMIC | Status: DC

## 2015-03-29 NOTE — Telephone Encounter (Signed)
rx refill

## 2015-03-30 ENCOUNTER — Ambulatory Visit (INDEPENDENT_AMBULATORY_CARE_PROVIDER_SITE_OTHER): Payer: Medicare Other | Admitting: Pharmacist

## 2015-03-30 DIAGNOSIS — I4891 Unspecified atrial fibrillation: Secondary | ICD-10-CM | POA: Diagnosis not present

## 2015-03-30 DIAGNOSIS — I82401 Acute embolism and thrombosis of unspecified deep veins of right lower extremity: Secondary | ICD-10-CM

## 2015-03-30 DIAGNOSIS — Z7901 Long term (current) use of anticoagulants: Secondary | ICD-10-CM | POA: Diagnosis not present

## 2015-03-30 DIAGNOSIS — M810 Age-related osteoporosis without current pathological fracture: Secondary | ICD-10-CM | POA: Diagnosis not present

## 2015-03-30 LAB — COAGUCHEK XS/INR WAIVED
INR: 2.2 — AB (ref 0.9–1.1)
Prothrombin Time: 26.4 s

## 2015-03-30 MED ORDER — DENOSUMAB 60 MG/ML ~~LOC~~ SOLN
60.0000 mg | Freq: Once | SUBCUTANEOUS | Status: AC
  Administered 2015-03-30: 60 mg via SUBCUTANEOUS

## 2015-03-30 NOTE — Progress Notes (Signed)
Subjective:     Indication: atrial fibrillation Bleeding signs/symptoms: None Thromboembolic signs/symptoms: None  Missed Coumadin doses: None Medication changes: no Dietary changes: no Bacterial/viral infection: no Other concerns: yes - due to have Prolia administered today  { Objective:    INR Today: 2.2   Assessment:    Therapeutic INR for goal of 2-3   osteoporosis  Plan:    1. New dose: no change   2. Next INR: 1 month   3.  Prolia 60mg  adminstered SQ in office today (patient supplied)

## 2015-03-30 NOTE — Patient Instructions (Signed)
Anticoagulation Dose Instructions as of 03/30/2015      Candace [name] Tue Wed Thu Fri Sat   New Dose 2 mg 2 mg 2 mg 3 mg 2 mg 2 mg 2 mg    Description        Continue current warfarin dose of take 1 and 1/2 tablets on Wednesdays and take 1 tablet all other days.     INR was 2.2 today

## 2015-04-01 ENCOUNTER — Telehealth: Payer: Self-pay | Admitting: Pharmacist

## 2015-04-07 NOTE — Telephone Encounter (Signed)
Done and daughter called

## 2015-04-13 ENCOUNTER — Ambulatory Visit (INDEPENDENT_AMBULATORY_CARE_PROVIDER_SITE_OTHER): Payer: Medicare Other | Admitting: Family Medicine

## 2015-04-13 ENCOUNTER — Encounter: Payer: Self-pay | Admitting: Family Medicine

## 2015-04-13 VITALS — BP 139/86 | HR 80 | Temp 97.0°F | Ht <= 58 in | Wt 98.4 lb

## 2015-04-13 DIAGNOSIS — M792 Neuralgia and neuritis, unspecified: Secondary | ICD-10-CM | POA: Diagnosis not present

## 2015-04-13 NOTE — Progress Notes (Signed)
BP 139/86 mmHg  Pulse 80  Temp(Src) 97 F (36.1 C) (Oral)  Ht 4' 8.5" (1.435 m)  Wt 98 lb 6.4 oz (44.634 kg)  BMI 21.68 kg/m2   Subjective:    Patient ID: Candace [name], female    DOB: 1922/11/23, 82 y.o.   MRN: [id number]  HPI: Candace [name] is a 82 y.o. female presenting on 04/13/2015 for Bilateral foot pain   HPI Bilateral foot pain Patient says she has been having 1-2 weeks worth of pain over the dorsum of her foot near her toes and into her toes. She describes the pain as a burning sensation and tingling as well. She has never had this previously. She does not know if it's worse in the heat or cold. Her toes do get darker purple in color when she is in the cold. She denies any numbness or weakness in that foot. She just has the tingling and burning sensation. The pain is on both sides and does not radiate anywhere else.  Relevant past medical, surgical, family and social history reviewed and updated as indicated. Interim medical history since our last visit reviewed. Allergies and medications reviewed and updated.  Review of Systems  Constitutional: Negative for fever and chills.  HENT: Negative for congestion, ear discharge and ear pain.   Eyes: Negative for redness and visual disturbance.  Respiratory: Negative for chest tightness and shortness of breath.   Cardiovascular: Negative for chest pain and leg swelling.  Genitourinary: Negative for dysuria and difficulty urinating.  Musculoskeletal: Positive for myalgias. Negative for back pain, joint swelling and gait problem.  Skin: Positive for color change. Negative for rash.  Neurological: Positive for numbness. Negative for weakness, light-headedness and headaches.  Psychiatric/Behavioral: Negative for behavioral problems and agitation.  All other systems reviewed and are negative.   Per HPI unless specifically indicated above     Medication List       This list is accurate as of: 04/13/15  9:40 AM.  Always use  your most recent med list.               acetaminophen 500 MG tablet  Commonly known as:  TYLENOL  Take 500 mg by mouth as needed for pain.     amLODipine 2.5 MG tablet  Commonly known as:  NORVASC  Take 1 tablet (2.5 mg total) by mouth daily.     bimatoprost 0.01 % Soln  Commonly known as:  LUMIGAN  Place 1 drop into both eyes at bedtime.     brimonidine 0.1 % Soln  Commonly known as:  ALPHAGAN P  Apply 1 drop to eye 2 (two) times daily.     denosumab 60 MG/ML Soln injection  Commonly known as:  PROLIA  INJECT 60 MG INTO THE SKIN EVERY 6 MONTHS. BRING TO OFFICE FOR ADMINSTRATION     furosemide 20 MG tablet  Commonly known as:  LASIX  TAKE ONE-HALF TABLET BY MOUTH ONCE DAILY IN THE MORNING WITH  A  BANANA     lisinopril 40 MG tablet  Commonly known as:  PRINIVIL,ZESTRIL  Take 1 tablet (40 mg total) by mouth daily.     metoprolol tartrate 25 MG tablet  Commonly known as:  LOPRESSOR  Take 0.5 tablets (12.5 mg total) by mouth 2 (two) times daily.     simvastatin 40 MG tablet  Commonly known as:  ZOCOR  Take 1 tablet (40 mg total) by mouth at bedtime.     warfarin 2 MG  tablet  Commonly known as:  COUMADIN  TAKE ONE TABLET BY MOUTH ONCE DAILY. TAKE ONE OR TWO TABLETS DAILY AS INSTRUCTED BY ANTICOAGULATION           Objective:    BP 139/86 mmHg  Pulse 80  Temp(Src) 97 F (36.1 C) (Oral)  Ht 4' 8.5" (1.435 m)  Wt 98 lb 6.4 oz (44.634 kg)  BMI 21.68 kg/m2  Wt Readings from Last 3 Encounters:  04/13/15 98 lb 6.4 oz (44.634 kg)  01/20/15 94 lb 12.8 oz (43.001 kg)  11/23/14 95 lb (43.092 kg)    Physical Exam  Constitutional: She is oriented to person, place, and time. She appears well-developed and well-nourished. No distress.  Eyes: Conjunctivae and EOM are normal. Pupils are equal, round, and reactive to light.  Cardiovascular: Normal rate, regular rhythm, normal heart sounds and intact distal pulses.   No murmur heard. Pulses:      Dorsalis pedis pulses  are 1+ on the right side, and 1+ on the left side.       Posterior tibial pulses are 1+ on the right side, and 1+ on the left side.  Pulmonary/Chest: Effort normal and breath sounds normal. No respiratory distress. She has no wheezes.  Musculoskeletal: Normal range of motion. She exhibits no edema or tenderness.  Neurological: She is alert and oriented to person, place, and time. She displays normal reflexes. No cranial nerve deficit. She exhibits normal muscle tone. Coordination normal.  Skin: Skin is warm and dry. No rash noted. She is not diaphoretic.  Patient has a purplish discoloration to her toes on both feet.. It is not reproducible but in pinpoint that it is on the dorsum of her toes and distal foot. There is none on the plantar surface. Sensation intact bilaterally, because of discoloration difficult to discern capillary refill.  Psychiatric: She has a normal mood and affect. Her behavior is normal.  Nursing note and vitals reviewed.      Assessment & Plan:   Problem List Items Addressed This Visit    None    Visit Diagnoses    Neuropathic pain    -  Primary    Likely from poor circulation    Relevant Orders    CBC with Differential/Platelet    Vitamin B12    TSH        Follow up plan: Return if symptoms worsen or fail to improve.  Counseling provided for all of the vaccine components Orders Placed This Encounter  Procedures  . CBC with Differential/Platelet  . Vitamin B12  . TSH    Caryl Pina, MD [facility] 04/13/2015, 9:40 AM

## 2015-04-14 LAB — CBC WITH DIFFERENTIAL/PLATELET
Basophils Absolute: 0 x10E3/uL (ref 0.0–0.2)
Basos: 0 %
EOS (ABSOLUTE): 0.1 x10E3/uL (ref 0.0–0.4)
Eos: 1 %
Hematocrit: 41.7 % (ref 34.0–46.6)
Hemoglobin: 13.5 g/dL (ref 11.1–15.9)
Immature Grans (Abs): 0 x10E3/uL (ref 0.0–0.1)
Immature Granulocytes: 0 %
Lymphocytes Absolute: 1.5 x10E3/uL (ref 0.7–3.1)
Lymphs: 20 %
MCH: 29.1 pg (ref 26.6–33.0)
MCHC: 32.4 g/dL (ref 31.5–35.7)
MCV: 90 fL (ref 79–97)
Monocytes Absolute: 0.7 x10E3/uL (ref 0.1–0.9)
Monocytes: 9 %
Neutrophils Absolute: 5.1 x10E3/uL (ref 1.4–7.0)
Neutrophils: 70 %
Platelets: 227 x10E3/uL (ref 150–379)
RBC: 4.64 x10E6/uL (ref 3.77–5.28)
RDW: 15 % (ref 12.3–15.4)
WBC: 7.4 x10E3/uL (ref 3.4–10.8)

## 2015-04-14 LAB — VITAMIN B12: Vitamin B-12: 301 pg/mL (ref 211–946)

## 2015-04-14 LAB — TSH: TSH: 3.82 u[IU]/mL (ref 0.450–4.500)

## 2015-04-18 ENCOUNTER — Telehealth: Payer: Self-pay | Admitting: *Deleted

## 2015-04-18 NOTE — Telephone Encounter (Signed)
All labs were normal. Go ahead and mail her a copy of the results if she would like.

## 2015-04-18 NOTE — Telephone Encounter (Signed)
Patient aware of lab results and did not request copy .

## 2015-04-18 NOTE — Telephone Encounter (Signed)
Patient requesting last lab results, please.

## 2015-05-04 ENCOUNTER — Ambulatory Visit (INDEPENDENT_AMBULATORY_CARE_PROVIDER_SITE_OTHER): Payer: Medicare Other | Admitting: Pharmacist

## 2015-05-04 DIAGNOSIS — Z7901 Long term (current) use of anticoagulants: Secondary | ICD-10-CM

## 2015-05-04 DIAGNOSIS — I4891 Unspecified atrial fibrillation: Secondary | ICD-10-CM

## 2015-05-04 DIAGNOSIS — I82401 Acute embolism and thrombosis of unspecified deep veins of right lower extremity: Secondary | ICD-10-CM | POA: Diagnosis not present

## 2015-05-04 LAB — COAGUCHEK XS/INR WAIVED
INR: 3.3 — AB (ref 0.9–1.1)
PROTHROMBIN TIME: 39.9 s

## 2015-05-04 NOTE — Patient Instructions (Signed)
Anticoagulation Dose Instructions as of 05/04/2015      Candace [name] Tue Wed Thu Fri Sat   New Dose 2 mg 2 mg 2 mg 3 mg 2 mg 2 mg 2 mg    Description        No warfarin today - Wednesday, April 26th.  Then continue current warfarin dose of take 1 and 1/2 tablets on Wednesdays and take 1 tablet all other days.     INR was 3.3

## 2015-05-05 ENCOUNTER — Ambulatory Visit (INDEPENDENT_AMBULATORY_CARE_PROVIDER_SITE_OTHER): Payer: Medicare Other | Admitting: Nurse Practitioner

## 2015-05-05 ENCOUNTER — Encounter: Payer: Self-pay | Admitting: Nurse Practitioner

## 2015-05-05 VITALS — BP 163/87 | HR 106 | Temp 97.3°F | Ht <= 58 in | Wt 98.0 lb

## 2015-05-05 DIAGNOSIS — G63 Polyneuropathy in diseases classified elsewhere: Secondary | ICD-10-CM | POA: Diagnosis not present

## 2015-05-05 DIAGNOSIS — I739 Peripheral vascular disease, unspecified: Secondary | ICD-10-CM | POA: Diagnosis not present

## 2015-05-05 MED ORDER — GABAPENTIN 100 MG PO CAPS: 100.0000 mg | ORAL_CAPSULE | Freq: Three times a day (TID) | ORAL | Status: DC

## 2015-05-05 NOTE — Progress Notes (Signed)
   Subjective:    Patient ID: Candace [name], female    DOB: [birth year], 82 y.o.   MRN: [id number]  HPI  Patient in c/o bil foot pain- started months ago and no one can tell her what is going on. They turn blue and cold when she sits- no pain whne walking- just burning sensation and bedtime.   Review of Systems  Constitutional: Negative.   HENT: Negative.   Respiratory: Negative.   Cardiovascular: Negative.   Genitourinary: Negative.   Neurological: Negative.   Psychiatric/Behavioral: Negative.   All other systems reviewed and are negative.      Objective:   Physical Exam  Constitutional: She is oriented to person, place, and time. She appears well-developed and well-nourished.  Cardiovascular: Normal rate, regular rhythm and normal heart sounds.   Pulmonary/Chest: Effort normal and breath sounds normal.  Neurological: She is alert and oriented to person, place, and time.  Skin: Skin is warm and dry.  Mild cyanosis of bil feet- cool to touch- @+ pedal pulses bil with brisk cap refill. Positive monofilament bil feet  Psychiatric: She has a normal mood and affect. Her behavior is normal. Judgment and thought content normal.   BP 163/87 mmHg  Pulse 106  Temp(Src) 97.3 F (36.3 C) (Oral)  Ht 4\' 8"  (1.422 m)  Wt 98 lb (44.453 kg)  BMI 21.98 kg/m2       Assessment & Plan:   1. Neuropathy due to peripheral vascular disease ([place])    Meds ordered this encounter  Medications  . gabapentin (NEURONTIN) 100 MG capsule    Sig: Take 1 capsule (100 mg total) by mouth 3 (three) times daily.    Dispense:  90 capsule    Refill:  3    Order Specific Question:  Supervising Provider    Answer:  Chipper Herb [[id number]]   Keep feet warm at night RTO prn  [name] Done, FNP

## 2015-05-05 NOTE — Patient Instructions (Signed)

## 2015-05-09 ENCOUNTER — Telehealth: Payer: Self-pay | Admitting: Nurse Practitioner

## 2015-05-09 NOTE — Telephone Encounter (Signed)
Patient called stating that gabapentin is making her very fatigue.  She states that "she is falling asleep everywhere and that medicine makes her feel weird"

## 2015-05-09 NOTE — Telephone Encounter (Signed)
Nothing else that can be given for burning sensation of feet except lyrica and we do not usually give this to elderly people

## 2015-05-10 ENCOUNTER — Other Ambulatory Visit: Payer: Self-pay | Admitting: Physician Assistant

## 2015-05-13 NOTE — Telephone Encounter (Signed)
Patient stopped gabapentin and aware lyrica is not really an option for her either.

## 2015-06-07 ENCOUNTER — Other Ambulatory Visit: Payer: Self-pay | Admitting: Nurse Practitioner

## 2015-06-10 ENCOUNTER — Ambulatory Visit (INDEPENDENT_AMBULATORY_CARE_PROVIDER_SITE_OTHER): Payer: Medicare Other | Admitting: Nurse Practitioner

## 2015-06-10 ENCOUNTER — Encounter: Payer: Self-pay | Admitting: Nurse Practitioner

## 2015-06-10 VITALS — BP 145/82 | HR 88 | Temp 96.5°F | Ht <= 58 in | Wt 96.0 lb

## 2015-06-10 DIAGNOSIS — I739 Peripheral vascular disease, unspecified: Secondary | ICD-10-CM | POA: Diagnosis not present

## 2015-06-10 DIAGNOSIS — E785 Hyperlipidemia, unspecified: Secondary | ICD-10-CM

## 2015-06-10 DIAGNOSIS — M8000XD Age-related osteoporosis with current pathological fracture, unspecified site, subsequent encounter for fracture with routine healing: Secondary | ICD-10-CM

## 2015-06-10 DIAGNOSIS — Z7901 Long term (current) use of anticoagulants: Secondary | ICD-10-CM | POA: Diagnosis not present

## 2015-06-10 DIAGNOSIS — F17200 Nicotine dependence, unspecified, uncomplicated: Secondary | ICD-10-CM | POA: Diagnosis not present

## 2015-06-10 DIAGNOSIS — I4891 Unspecified atrial fibrillation: Secondary | ICD-10-CM

## 2015-06-10 DIAGNOSIS — I10 Essential (primary) hypertension: Secondary | ICD-10-CM | POA: Diagnosis not present

## 2015-06-10 DIAGNOSIS — I82401 Acute embolism and thrombosis of unspecified deep veins of right lower extremity: Secondary | ICD-10-CM

## 2015-06-10 LAB — COAGUCHEK XS/INR WAIVED
INR: 3.4 — AB (ref 0.9–1.1)
PROTHROMBIN TIME: 41.4 s

## 2015-06-10 MED ORDER — METOPROLOL TARTRATE 25 MG PO TABS: 12.5000 mg | ORAL_TABLET | Freq: Two times a day (BID) | ORAL | Status: DC

## 2015-06-10 MED ORDER — SIMVASTATIN 40 MG PO TABS: 40.0000 mg | ORAL_TABLET | Freq: Every day | ORAL | Status: AC

## 2015-06-10 MED ORDER — AMLODIPINE BESYLATE 2.5 MG PO TABS: 2.5000 mg | ORAL_TABLET | Freq: Every day | ORAL | Status: DC

## 2015-06-10 MED ORDER — LISINOPRIL 40 MG PO TABS: 40.0000 mg | ORAL_TABLET | Freq: Every day | ORAL | Status: DC

## 2015-06-10 MED ORDER — FUROSEMIDE 20 MG PO TABS: ORAL_TABLET | ORAL | Status: DC

## 2015-06-10 NOTE — Progress Notes (Signed)
Subjective:    Patient ID: Candace [name], female    DOB: 11/13/1922, 82 y.o.   MRN: [id number]   Patient here today for follow up of chronic medical problems.  Outpatient Encounter Prescriptions as of 06/10/2015  Medication Sig  . acetaminophen (TYLENOL) 500 MG tablet Take 500 mg by mouth as needed for pain.  Marland Kitchen amLODipine (NORVASC) 2.5 MG tablet Take 1 tablet (2.5 mg total) by mouth daily.  Marland Kitchen amLODipine (NORVASC) 2.5 MG tablet TAKE ONE TABLET BY MOUTH ONCE DAILY  . bimatoprost (LUMIGAN) 0.01 % SOLN Place 1 drop into both eyes at bedtime.  . brimonidine (ALPHAGAN P) 0.1 % SOLN Apply 1 drop to eye 2 (two) times daily.   Marland Kitchen denosumab (PROLIA) 60 MG/ML SOLN injection INJECT 60 MG INTO THE SKIN EVERY 6 MONTHS. BRING TO OFFICE FOR ADMINSTRATION  . furosemide (LASIX) 20 MG tablet TAKE ONE-HALF TABLET BY MOUTH ONCE DAILY IN THE MORNING WITH  A  BANANA  . gabapentin (NEURONTIN) 100 MG capsule Take 1 capsule (100 mg total) by mouth 3 (three) times daily.  Marland Kitchen lisinopril (PRINIVIL,ZESTRIL) 40 MG tablet Take 1 tablet (40 mg total) by mouth daily.  . metoprolol tartrate (LOPRESSOR) 25 MG tablet Take 0.5 tablets (12.5 mg total) by mouth 2 (two) times daily.  . simvastatin (ZOCOR) 40 MG tablet Take 1 tablet (40 mg total) by mouth at bedtime.  Marland Kitchen warfarin (COUMADIN) 2 MG tablet TAKE ONE OR TWO TABLETS BY MOUTH DAILY AS DIRECTED AS INSTRUCTED BY ANTICOAGULATION   No facility-administered encounter medications on file as of 06/10/2015.    Hypertension This is a chronic problem. The current episode started more than 1 year ago. The problem is unchanged. The problem is controlled. Risk factors for coronary artery disease include dyslipidemia and post-menopausal state. Past treatments include ACE inhibitors and calcium channel blockers. The current treatment provides moderate improvement. Compliance problems include diet and exercise.   Hyperlipidemia This is a chronic problem. The current episode started more  than 1 year ago. The problem is controlled. Recent lipid tests were reviewed and are normal. Current antihyperlipidemic treatment includes statins. The current treatment provides moderate improvement of lipids. Compliance problems include adherence to diet and adherence to exercise.  Risk factors for coronary artery disease include dyslipidemia, hypertension and post-menopausal.  atrial fib Chronic coumadin therapy- will do INR today- no c/o palpiutations Osteoporosis with compression fracture prolia works well. Neuropathy bil feet Says that both of her feet still hurt all the time- she was given neurotin but said she could not tolerate meds- made her crazy. Says that her lower legs and feet stay blue all the time.    Review of Systems  Constitutional: Negative.   HENT: Negative.   Respiratory: Negative.   Cardiovascular: Negative.   Gastrointestinal: Negative.   Genitourinary: Negative.   Neurological: Negative.   Psychiatric/Behavioral: Negative.   All other systems reviewed and are negative.      Objective:   Physical Exam  Constitutional: She is oriented to person, place, and time. She appears well-developed and well-nourished.  HENT:  Nose: Nose normal.  Mouth/Throat: Oropharynx is clear and moist.  Eyes: EOM are normal.  Neck: Trachea normal, normal range of motion and full passive range of motion without pain. Neck supple. No JVD present. Carotid bruit is not present. No thyromegaly present.  Cardiovascular: Normal rate, regular rhythm, normal heart sounds and intact distal pulses.  Exam reveals no gallop and no friction rub.   No murmur heard. Pulmonary/Chest:  Effort normal and breath sounds normal.  Abdominal: Soft. Bowel sounds are normal. She exhibits no distension and no mass. There is no tenderness.  Musculoskeletal: Normal range of motion.  Lymphadenopathy:    She has no cervical adenopathy.  Neurological: She is alert and oriented to person, place, and time. She  has normal reflexes.  Skin: Skin is warm and dry.  Keratotic lesion on right wrist bil lower legs and feet are cyanotic- warm to touch with palpable pedal pulses.  Psychiatric: She has a normal mood and affect. Her behavior is normal. Judgment and thought content normal.    BP 145/82 mmHg  Pulse 88  Temp(Src) 96.5 F (35.8 C) (Oral)  Ht 4' 8"  (1.422 m)  Wt 96 lb (43.545 kg)  BMI 21.53 kg/m2   INR- 3.4- see anticoag documentation-[name] Done, FNP        Assessment & Plan:  1. Atrial fibrillation, unspecified type ([place]) Continue current meds See anticoag documentation - metoprolol tartrate (LOPRESSOR) 25 MG tablet; Take 0.5 tablets (12.5 mg total) by mouth 2 (two) times daily.  Dispense: 90 tablet; Refill: 1 - CoaguChek XS/INR Waived  2. Essential hypertension Do not add salt to diet - CMP14+EGFR - amLODipine (NORVASC) 2.5 MG tablet; Take 1 tablet (2.5 mg total) by mouth daily.  Dispense: 90 tablet; Refill: 0 - lisinopril (PRINIVIL,ZESTRIL) 40 MG tablet; Take 1 tablet (40 mg total) by mouth daily.  Dispense: 90 tablet; Refill: 1 - furosemide (LASIX) 20 MG tablet; TAKE ONE-HALF TABLET BY MOUTH ONCE DAILY IN THE MORNING WITH  A  BANANA  Dispense: 90 tablet; Refill: 1  3. Hyperlipidemia with target LDL less than 100 Low fat diet - Lipid panel - simvastatin (ZOCOR) 40 MG tablet; Take 1 tablet (40 mg total) by mouth at bedtime.  Dispense: 90 tablet; Refill: 1  4. Osteoporosis with pathological fracture, with routine healing, subsequent encounter Weight bearing exercises  5. Long term current use of anticoagulant therapy - CoaguChek XS/INR Waived  6. Tobacco use disorder Encouraged cessation  7. Hyperlipidemia  8. DVT (deep venous thrombosis), right - CoaguChek XS/INR Waived  9. PAD (peripheral artery disease) ([place]) Send  To specialist to verify - Ambulatory referral to Vascular Surgery    Labs pending Health maintenance reviewed Diet and exercise  encouraged Continue all meds Follow up  In 3 months   [place], FNP

## 2015-06-10 NOTE — Patient Instructions (Signed)
Anticoagulation Dose Instructions as of 06/10/2015      Candace [name] Tue Wed Thu Fri Sat   New Dose 2 mg 2 mg 2 mg 2 mg 2 mg 2 mg 2 mg    Description        No warfarin today - Friday June 2.  Then 1 tablet po qd - recheck in 2 weeks     Recheck in 2 weeks

## 2015-06-11 LAB — CMP14+EGFR
ALK PHOS: 46 IU/L (ref 39–117)
ALT: 6 IU/L (ref 0–32)
AST: 13 IU/L (ref 0–40)
Albumin/Globulin Ratio: 1.6 (ref 1.2–2.2)
Albumin: 4 g/dL (ref 3.2–4.6)
BUN/Creatinine Ratio: 9 — ABNORMAL LOW (ref 12–28)
BUN: 12 mg/dL (ref 10–36)
Bilirubin Total: 0.3 mg/dL (ref 0.0–1.2)
CO2: 22 mmol/L (ref 18–29)
CREATININE: 1.34 mg/dL — AB (ref 0.57–1.00)
Calcium: 8.7 mg/dL (ref 8.7–10.3)
Chloride: 101 mmol/L (ref 96–106)
GFR calc Af Amer: 40 mL/min/{1.73_m2} — ABNORMAL LOW (ref >59)
GFR calc non Af Amer: 35 mL/min/{1.73_m2} — ABNORMAL LOW (ref >59)
GLOBULIN, TOTAL: 2.5 g/dL (ref 1.5–4.5)
Glucose: 92 mg/dL (ref 65–99)
POTASSIUM: 3.6 mmol/L (ref 3.5–5.2)
SODIUM: 141 mmol/L (ref 134–144)
Total Protein: 6.5 g/dL (ref 6.0–8.5)

## 2015-06-11 LAB — LIPID PANEL
CHOL/HDL RATIO: 4.1 ratio (ref 0.0–4.4)
CHOLESTEROL TOTAL: 191 mg/dL (ref 100–199)
HDL: 47 mg/dL (ref >39)
LDL Calculated: 111 mg/dL — ABNORMAL HIGH (ref 0–99)
TRIGLYCERIDES: 165 mg/dL — AB (ref 0–149)
VLDL Cholesterol Cal: 33 mg/dL (ref 5–40)

## 2015-06-16 DIAGNOSIS — I70203 Unspecified atherosclerosis of native arteries of extremities, bilateral legs: Secondary | ICD-10-CM | POA: Diagnosis not present

## 2015-06-16 DIAGNOSIS — B351 Tinea unguium: Secondary | ICD-10-CM | POA: Diagnosis not present

## 2015-06-21 ENCOUNTER — Other Ambulatory Visit: Payer: Self-pay | Admitting: *Deleted

## 2015-06-21 DIAGNOSIS — I7092 Chronic total occlusion of artery of the extremities: Secondary | ICD-10-CM

## 2015-07-13 ENCOUNTER — Ambulatory Visit (INDEPENDENT_AMBULATORY_CARE_PROVIDER_SITE_OTHER): Payer: Medicare Other | Admitting: Pharmacist

## 2015-07-13 VITALS — BP 110/72 | HR 64

## 2015-07-13 DIAGNOSIS — I82401 Acute embolism and thrombosis of unspecified deep veins of right lower extremity: Secondary | ICD-10-CM

## 2015-07-13 DIAGNOSIS — Z7901 Long term (current) use of anticoagulants: Secondary | ICD-10-CM

## 2015-07-13 DIAGNOSIS — I4891 Unspecified atrial fibrillation: Secondary | ICD-10-CM | POA: Diagnosis not present

## 2015-07-13 LAB — COAGUCHEK XS/INR WAIVED
INR: 3 — ABNORMAL HIGH (ref 0.9–1.1)
PROTHROMBIN TIME: 36.1 s

## 2015-07-13 NOTE — Patient Instructions (Signed)
Anticoagulation Dose Instructions as of 07/13/2015      Candace [name] Tue Wed Thu Fri Sat   New Dose 2 mg 2 mg 2 mg 2 mg 2 mg 2 mg 2 mg    Description        Continue to take warfarin 5mg  - take 1 tablet daily     INR was 3.0 today

## 2015-07-14 ENCOUNTER — Encounter: Payer: Self-pay | Admitting: Pharmacist

## 2015-07-28 ENCOUNTER — Other Ambulatory Visit: Payer: Self-pay

## 2015-07-28 DIAGNOSIS — I10 Essential (primary) hypertension: Secondary | ICD-10-CM

## 2015-07-28 MED ORDER — FUROSEMIDE 20 MG PO TABS: ORAL_TABLET | ORAL | Status: DC

## 2015-07-28 NOTE — Telephone Encounter (Signed)
Last seen 06/10/15  MMM  Pt was taking 1/2 pill daily and says now she is taking 1 daily so they needed new RX

## 2015-08-04 ENCOUNTER — Other Ambulatory Visit: Payer: Self-pay | Admitting: Nurse Practitioner

## 2015-08-05 ENCOUNTER — Ambulatory Visit (INDEPENDENT_AMBULATORY_CARE_PROVIDER_SITE_OTHER): Payer: Medicare Other | Admitting: Pediatrics

## 2015-08-05 ENCOUNTER — Encounter: Payer: Self-pay | Admitting: Pediatrics

## 2015-08-05 VITALS — BP 141/66 | HR 46 | Temp 96.7°F | Ht <= 58 in | Wt 98.6 lb

## 2015-08-05 DIAGNOSIS — I10 Essential (primary) hypertension: Secondary | ICD-10-CM

## 2015-08-05 DIAGNOSIS — I350 Nonrheumatic aortic (valve) stenosis: Secondary | ICD-10-CM | POA: Diagnosis not present

## 2015-08-05 DIAGNOSIS — R001 Bradycardia, unspecified: Secondary | ICD-10-CM | POA: Diagnosis not present

## 2015-08-05 DIAGNOSIS — I4891 Unspecified atrial fibrillation: Secondary | ICD-10-CM

## 2015-08-05 NOTE — Progress Notes (Signed)
    Subjective:    Patient ID: Candace [name], female    DOB: 04-03-1922, 82 y.o.   MRN: [id number]  CC: Shortness of Breath (3 days)   HPI: Candace [name] is a 82 y.o. female presenting for Shortness of Breath (3 days)  Feels more tired than usual Cant walk or do as much housework as usual for past few days Usually very active Doesn't like sitting Sometimes has color change in feet Rarely props up feet No swelling feet or LE No fevers, cough Normal appetite No chest pain or nausea   Depression screen [facility] 2/9 08/05/2015 06/10/2015 04/13/2015 10/20/2014 06/04/2013  Decreased Interest 0 0 0 0 0  Down, Depressed, Hopeless 0 0 0 0 1  PHQ - 2 Score 0 0 0 0 1     Relevant past medical, surgical, family and social history reviewed and updated. Interim medical history since our last visit reviewed. Allergies and medications reviewed and updated.  History  Smoking Status  . Never Smoker  Smokeless Tobacco  . Current User  . Types: Snuff    ROS: Per HPI      Objective:    BP (!) 141/66 (BP Location: Right Arm, Patient Position: Sitting, Cuff Size: Normal)   Pulse (!) 46   Temp (!) 96.7 F (35.9 C) (Oral)   Ht 4\' 8"  (1.422 m)   Wt 98 lb 9.6 oz (44.7 kg)   SpO2 96%   BMI 22.11 kg/m   Wt Readings from Last 3 Encounters:  08/05/15 98 lb 9.6 oz (44.7 kg)  06/10/15 96 lb (43.5 kg)  05/05/15 98 lb (44.5 kg)     Gen: NAD, alert, cooperative with exam, NCAT EYES: EOMI, no scleral injection or icterus CV: bradycardic, normal 99991111, II/VI systolic ejection murmur RUSB Resp: CTABL, no wheezes, normal WOB Abd: +BS, soft, NTND.  Ext: No edema, warm. 1+ DP pulses b/l, cap refill 1 sec Neuro: Alert and oriented, strength equal b/l UE and LE, coordination grossly normal     Assessment & Plan:    Candace [name] was seen today for shortness of breath, fatigue. Found to be bradycardic. On metoprolol. Will stop metoprolol, on 12.5 BID. Recheck HR.  Diagnoses and all orders for this  visit:  Essential hypertension Adequate control today  Atrial fibrillation, unspecified type ([place]) Rate controlled Stopping metoprolol due to bradycardia Let us know if any episodes of racing heart  Aortic stenosis Repeat ECHO if fatigue/symptoms not improved with stopping beta blocker  Bradycardia Dc BB   Follow up plan: 1-2 weeks  [name], MD [facility] 08/05/2015, 4:26 PM

## 2015-08-08 ENCOUNTER — Encounter (HOSPITAL_COMMUNITY): Payer: Self-pay | Admitting: Emergency Medicine

## 2015-08-08 ENCOUNTER — Telehealth: Payer: Self-pay | Admitting: Cardiology

## 2015-08-08 ENCOUNTER — Observation Stay (HOSPITAL_COMMUNITY)
Admission: EM | Admit: 2015-08-08 | Discharge: 2015-08-11 | Disposition: A | Discharge status: Home / Self Care | Payer: Medicare Other | Attending: Family Medicine | Admitting: Family Medicine

## 2015-08-08 ENCOUNTER — Emergency Department (HOSPITAL_COMMUNITY): Payer: Medicare Other

## 2015-08-08 DIAGNOSIS — N183 Chronic kidney disease, stage 3 unspecified: Secondary | ICD-10-CM | POA: Diagnosis present

## 2015-08-08 DIAGNOSIS — I472 Ventricular tachycardia: Secondary | ICD-10-CM

## 2015-08-08 DIAGNOSIS — Z79899 Other long term (current) drug therapy: Secondary | ICD-10-CM | POA: Diagnosis not present

## 2015-08-08 DIAGNOSIS — R0600 Dyspnea, unspecified: Secondary | ICD-10-CM | POA: Diagnosis present

## 2015-08-08 DIAGNOSIS — Z7901 Long term (current) use of anticoagulants: Secondary | ICD-10-CM | POA: Diagnosis not present

## 2015-08-08 DIAGNOSIS — Z85828 Personal history of other malignant neoplasm of skin: Secondary | ICD-10-CM | POA: Diagnosis not present

## 2015-08-08 DIAGNOSIS — M25512 Pain in left shoulder: Secondary | ICD-10-CM | POA: Diagnosis not present

## 2015-08-08 DIAGNOSIS — I509 Heart failure, unspecified: Secondary | ICD-10-CM

## 2015-08-08 DIAGNOSIS — R0602 Shortness of breath: Secondary | ICD-10-CM | POA: Diagnosis present

## 2015-08-08 DIAGNOSIS — J90 Pleural effusion, not elsewhere classified: Secondary | ICD-10-CM | POA: Diagnosis not present

## 2015-08-08 DIAGNOSIS — R778 Other specified abnormalities of plasma proteins: Secondary | ICD-10-CM | POA: Diagnosis present

## 2015-08-08 DIAGNOSIS — R7989 Other specified abnormal findings of blood chemistry: Secondary | ICD-10-CM | POA: Diagnosis not present

## 2015-08-08 DIAGNOSIS — I4891 Unspecified atrial fibrillation: Secondary | ICD-10-CM | POA: Diagnosis present

## 2015-08-08 DIAGNOSIS — I350 Nonrheumatic aortic (valve) stenosis: Secondary | ICD-10-CM

## 2015-08-08 DIAGNOSIS — R791 Abnormal coagulation profile: Secondary | ICD-10-CM

## 2015-08-08 DIAGNOSIS — I4729 Other ventricular tachycardia: Secondary | ICD-10-CM

## 2015-08-08 DIAGNOSIS — I495 Sick sinus syndrome: Secondary | ICD-10-CM | POA: Diagnosis present

## 2015-08-08 DIAGNOSIS — I110 Hypertensive heart disease with heart failure: Secondary | ICD-10-CM | POA: Diagnosis not present

## 2015-08-08 DIAGNOSIS — N289 Disorder of kidney and ureter, unspecified: Secondary | ICD-10-CM

## 2015-08-08 DIAGNOSIS — I10 Essential (primary) hypertension: Secondary | ICD-10-CM | POA: Diagnosis present

## 2015-08-08 HISTORY — DX: Essential (primary) hypertension: I10

## 2015-08-08 HISTORY — DX: Personal history of other malignant neoplasm of skin: Z85.828

## 2015-08-08 HISTORY — DX: Nonrheumatic aortic (valve) stenosis: I35.0

## 2015-08-08 LAB — COMPREHENSIVE METABOLIC PANEL WITH GFR
ALT: 19 U/L (ref 14–54)
AST: 18 U/L (ref 15–41)
Albumin: 3.7 g/dL (ref 3.5–5.0)
Alkaline Phosphatase: 50 U/L (ref 38–126)
Anion gap: 6 (ref 5–15)
BUN: 14 mg/dL (ref 6–20)
CO2: 25 mmol/L (ref 22–32)
Calcium: 8.6 mg/dL — ABNORMAL LOW (ref 8.9–10.3)
Chloride: 108 mmol/L (ref 101–111)
Creatinine, Ser: 1.52 mg/dL — ABNORMAL HIGH (ref 0.44–1.00)
GFR calc Af Amer: 34 mL/min — ABNORMAL LOW
GFR calc non Af Amer: 29 mL/min — ABNORMAL LOW
Glucose, Bld: 89 mg/dL (ref 65–99)
Potassium: 3.7 mmol/L (ref 3.5–5.1)
Sodium: 139 mmol/L (ref 135–145)
Total Bilirubin: 0.7 mg/dL (ref 0.3–1.2)
Total Protein: 6.8 g/dL (ref 6.5–8.1)

## 2015-08-08 LAB — CBC WITH DIFFERENTIAL/PLATELET
Basophils Absolute: 0 K/uL (ref 0.0–0.1)
Basophils Relative: 0 %
Eosinophils Absolute: 0.1 K/uL (ref 0.0–0.7)
Eosinophils Relative: 1 %
HCT: 38.5 % (ref 36.0–46.0)
Hemoglobin: 12.4 g/dL (ref 12.0–15.0)
Lymphocytes Relative: 15 %
Lymphs Abs: 0.7 K/uL (ref 0.7–4.0)
MCH: 29 pg (ref 26.0–34.0)
MCHC: 32.2 g/dL (ref 30.0–36.0)
MCV: 90.2 fL (ref 78.0–100.0)
Monocytes Absolute: 0.5 K/uL (ref 0.1–1.0)
Monocytes Relative: 10 %
Neutro Abs: 3.5 K/uL (ref 1.7–7.7)
Neutrophils Relative %: 74 %
Platelets: 239 K/uL (ref 150–400)
RBC: 4.27 MIL/uL (ref 3.87–5.11)
RDW: 15.2 % (ref 11.5–15.5)
WBC: 4.7 K/uL (ref 4.0–10.5)

## 2015-08-08 LAB — BRAIN NATRIURETIC PEPTIDE: B Natriuretic Peptide: 940 pg/mL — ABNORMAL HIGH (ref 0.0–100.0)

## 2015-08-08 LAB — PROTIME-INR
INR: 4.33
Prothrombin Time: 42.7 seconds — ABNORMAL HIGH (ref 11.4–15.2)

## 2015-08-08 LAB — TROPONIN I: Troponin I: 0.03 ng/mL (ref <0.03)

## 2015-08-08 MED ORDER — FUROSEMIDE 10 MG/ML IJ SOLN
20.0000 mg | Freq: Once | INTRAMUSCULAR | Status: AC
  Administered 2015-08-08: 20 mg via INTRAVENOUS
  Filled 2015-08-08: qty 2

## 2015-08-08 NOTE — Telephone Encounter (Signed)
Spoke with daughter, Fraser Din. Pt seen by PCP Friday and HR was 46.  PCP stopped Metoprolol.  Daughter wanted to speak with Cardiology to see if Dr. Percival Spanish was in agreement with this.  Daughter also states that pt's feet are black and she is concerned about circulation issues.  States pt is constantly SOB.  Pt also having pain down right side of her neck.  Daughter states pt is suppose to go to PCP on Wednesday for [place] but feels it shouldn't wait that long and wonders if pt should go to hospital.  Advised daughter to proceed with pt to ER for evaluation since pt's feet are black and pt's SOB is worse.  Daughter verbalized understanding and was in agreement with this plan.  Will route to Dr. Percival Spanish to make him aware.

## 2015-08-08 NOTE — ED Notes (Signed)
Nurse unavailable for report.  

## 2015-08-08 NOTE — Telephone Encounter (Signed)
New Message  Pt c/o Shortness Of Breath: STAT if SOB developed within the last 24 hours or pt is noticeably SOB on the phone  1. Are you currently SOB (can you hear that pt is SOB on the phone)? Yes  2. How long have you been experiencing SOB? Two Weeks  3. Are you SOB when sitting or when up moving around? All the time  4. Are you currently experiencing any other symptoms? H.R.-26, Feet/Toes turning black, hurting down neck on right side.   Pt called PCP but did not return phone call. [place].

## 2015-08-08 NOTE — ED Provider Notes (Signed)
[facility] DEPT Provider Note   CSN: [id number] Arrival date & time: 08/08/15  1511  First Provider Contact:  None       History   Chief Complaint Chief Complaint  Patient presents with  . Shortness of Breath    HPI Candace [name] is a 82 y.o. female.   Shortness of Breath   Pt was seen at 1700. Per pt and her family, c/o gradual onset and persistence of constant SOB for the past 1 to 2 weeks. Pt states she has had intermittent chest "pains" last week also, but none more recently. Pt states she was evaluated by her PMD 3 days ago where it was noted her HR was "46." Pt's PMD d/c pt's metoprolol. Pt then called her Cardiologist today "to make sure that was ok" and she was told to come to the ED for further evaluation. Pt also c/o left clavicle area "pain" for the past week. Pt states she "is ok now" and wants to go home. Pt and family deny any other complaints.  Denies palpitations, no current CP, no cough, no abd pain, no N/V/D, no back pain.      Past Medical History:  Diagnosis Date  . Atrial fibrillation ([place])   . Cancer ([place]) 2016   skin  . DVT (deep venous thrombosis) ([place])   . Glaucoma   . Hypercholesterolemia   . Hypertension   . PE (pulmonary embolism)     Patient Active Problem List   Diagnosis Date Noted  . Aortic stenosis 08/05/2015  . Hyperlipidemia 06/10/2015  . Tobacco use disorder 01/28/2014  . Osteoporosis with pathological fracture 08/13/2012  . DVT (deep venous thrombosis), right 05/13/2012  . HTN (hypertension) 05/13/2012  . Long term current use of anticoagulant therapy 05/13/2012  . Atrial fibrillation ([place]) 04/15/2012    Past Surgical History:  Procedure Laterality Date  . CHOLECYSTECTOMY    . KYPHOPLASTY    . SKIN CANCER EXCISION Left 2016   neck    OB History    Gravida Para Term Preterm AB Living             4   SAB TAB Ectopic Multiple Live Births                   Home Medications    Prior to Admission medications    Medication Sig Start Date End Date Taking? Authorizing Provider  acetaminophen (TYLENOL) 500 MG tablet Take 500 mg by mouth as needed for pain.    Historical Provider, MD  amLODipine (NORVASC) 2.5 MG tablet Take 1 tablet (2.5 mg total) by mouth daily. 06/10/15   [name] Done, FNP  bimatoprost (LUMIGAN) 0.01 % SOLN Place 1 drop into both eyes at bedtime. 03/29/15   [name] Done, FNP  brimonidine (ALPHAGAN P) 0.1 % SOLN Apply 1 drop to eye 2 (two) times daily.     Historical Provider, MD  denosumab (PROLIA) 60 MG/ML SOLN injection INJECT 60 MG INTO THE SKIN EVERY 6 MONTHS. BRING TO OFFICE FOR ADMINSTRATION 02/24/15   Cherre Robins, PharmD  furosemide (LASIX) 20 MG tablet Take one tablet daily 07/28/15   [name] Done, FNP  gabapentin (NEURONTIN) 100 MG capsule Take 1 capsule (100 mg total) by mouth 3 (three) times daily. 05/05/15   [name] Done, FNP  lisinopril (PRINIVIL,ZESTRIL) 40 MG tablet Take 1 tablet (40 mg total) by mouth daily. 06/10/15   [name] Done, FNP  simvastatin (ZOCOR) 40 MG tablet Take 1 tablet (40 mg total) by mouth  at bedtime. 06/10/15   [name] Done, FNP  warfarin (COUMADIN) 2 MG tablet TAKE ONE OR TWO TABLETS BY MOUTH DAILY AS DIRECTED AS INSTRUCTED BY ANTICOAGULATION 06/08/15   Fransisca Kaufmann Dettinger, MD    Family History Family History  Problem Relation Age of Onset  . Stroke Father   . Heart attack Mother   . Diabetes Brother   . Heart disease Brother   . Early death Sister     Social History Social History  Substance Use Topics  . Smoking status: Never Smoker  . Smokeless tobacco: Current User    Types: Snuff  . Alcohol use No     Allergies   Codeine; Fosamax [alendronate sodium]; Sulfa antibiotics; Tramadol; Boniva [ibandronic acid]; Septra [sulfamethoxazole-trimethoprim]; and Vioxx [rofecoxib]   Review of Systems Review of Systems  Respiratory: Positive for shortness of breath.   ROS: Statement: All systems negative  except as marked or noted in the HPI; Constitutional: Negative for fever and chills. ; ; Eyes: Negative for eye pain, redness and discharge. ; ; ENMT: Negative for ear pain, hoarseness, nasal congestion, sinus pressure and sore throat. ; ; Cardiovascular: +CP, SOB. Negative for palpitations, diaphoresis, and peripheral edema. ; ; Respiratory: Negative for cough, wheezing and stridor. ; ; Gastrointestinal: Negative for nausea, vomiting, diarrhea, abdominal pain, blood in stool, hematemesis, jaundice and rectal bleeding. . ; ; Genitourinary: Negative for dysuria, flank pain and hematuria. ; ; Musculoskeletal: Negative for back pain and neck pain. Negative for swelling and trauma.; ; Skin: Negative for pruritus, rash, abrasions, blisters, bruising and skin lesion.; ; Neuro: Negative for headache, lightheadedness and neck stiffness. Negative for weakness, altered level of consciousness, altered mental status, extremity weakness, paresthesias, involuntary movement, seizure and syncope.      Physical Exam Updated Vital Signs BP 156/79 (BP Location: Left Arm)   Pulse 69   Temp 97.8 F (36.6 C) (Oral)   Resp 16   Ht 5\' 1"  (1.549 m)   Wt 98 lb (44.5 kg)   SpO2 98%   BMI 18.52 kg/m   Physical Exam 1705: Physical examination:  Nursing notes reviewed; Vital signs and O2 SAT reviewed;  Constitutional: Well developed, Well nourished, Well hydrated, In no acute distress; Head:  Normocephalic, atraumatic; Eyes: EOMI, PERRL, No scleral icterus; ENMT: Mouth and pharynx normal, Mucous membranes moist; Neck: Supple, Full range of motion, No lymphadenopathy; Cardiovascular: Irregular rate and rhythm, No gallop; Respiratory: Breath sounds coarse & equal bilaterally, No wheezes.  Speaking full sentences with ease, Normal respiratory effort/excursion; Chest: +left clavicle tender to palp with deformity. No open wounds. No rash. Movement normal; Abdomen: Soft, Nontender, Nondistended, Normal bowel sounds; Genitourinary:  No CVA tenderness; Extremities: Pulses normal, No tenderness, No edema, No calf edema or asymmetry.; Neuro: AA&Ox3, Major CN grossly intact.  Speech clear. No gross focal motor or sensory deficits in extremities.; Skin: Color normal, Warm, Dry.    ED Treatments / Results  Labs (all labs ordered are listed, but only abnormal results are displayed)   EKG  EKG Interpretation  Date/Time:  Monday August 08 2015 15:16:33 EDT Ventricular Rate:  80 PR Interval:    QRS Duration: 78 QT Interval:  392 QTC Calculation: 452 R Axis:   -44 Text Interpretation:  Atrial fibrillation with premature ventricular or aberrantly conducted complexes Left axis deviation Anterior infarct , age undetermined ST & T wave abnormality, consider lateral ischemia Baseline wander When compared with ECG of 09/27/2012 Atrial fibrillation has replaced Junctional rhythm Confirmed by [facility]  MD,  Jaspreet Hollings [phone]) on 08/08/2015 5:10:11 PM        EKG Interpretation  Date/Time:  Monday August 08 2015 15:17:26 EDT Ventricular Rate:  77 PR Interval:    QRS Duration: 80 QT Interval:  406 QTC Calculation: 459 R Axis:   -46 Text Interpretation:  Atrial fibrillation with premature ventricular or aberrantly conducted complexes Left axis deviation Anterior infarct , age undetermined ST & T wave abnormality, consider lateral ischemia No significant change was found Since last tracing of earlier today Confirmed by [facility]  MD, Nunzio Cory [phone]) on 08/08/2015 5:11:19 PM        EKG Interpretation  Date/Time:  Monday August 08 2015 16:38:21 EDT Ventricular Rate:  61 PR Interval:    QRS Duration: 89 QT Interval:  440 QTC Calculation: 444 R Axis:   0 Text Interpretation:  Atrial fibrillation Probable anterior infarct, age indeterminate Nonspecific ST and T wave abnormality No significant change was found Since last tracing of earlier today Confirmed by [facility]  MD, Nunzio Cory [phone]) on 08/08/2015 5:12:40 PM         Radiology   Procedures Procedures (including critical care time)  Medications Ordered in ED Medications - No data to display   Initial Impression / Assessment and Plan / ED Course  I have reviewed the triage vital signs and the nursing notes.  Pertinent labs & imaging results that were available during my care of the patient were reviewed by me and considered in my medical decision making (see chart for details).  MDM Reviewed: previous chart, nursing note and vitals Reviewed previous: labs and ECG Interpretation: labs, ECG and x-ray   Results for orders placed or performed during the hospital encounter of 08/08/15  CBC with Differential  Result Value Ref Range   WBC 4.7 4.0 - 10.5 K/uL   RBC 4.27 3.87 - 5.11 MIL/uL   Hemoglobin 12.4 12.0 - 15.0 g/dL   HCT 38.5 36.0 - 46.0 %   MCV 90.2 78.0 - 100.0 fL   MCH 29.0 26.0 - 34.0 pg   MCHC 32.2 30.0 - 36.0 g/dL   RDW 15.2 11.5 - 15.5 %   Platelets 239 150 - 400 K/uL   Neutrophils Relative % 74 %   Neutro Abs 3.5 1.7 - 7.7 K/uL   Lymphocytes Relative 15 %   Lymphs Abs 0.7 0.7 - 4.0 K/uL   Monocytes Relative 10 %   Monocytes Absolute 0.5 0.1 - 1.0 K/uL   Eosinophils Relative 1 %   Eosinophils Absolute 0.1 0.0 - 0.7 K/uL   Basophils Relative 0 %   Basophils Absolute 0.0 0.0 - 0.1 K/uL  Comprehensive metabolic panel  Result Value Ref Range   Sodium 139 135 - 145 mmol/L   Potassium 3.7 3.5 - 5.1 mmol/L   Chloride 108 101 - 111 mmol/L   CO2 25 22 - 32 mmol/L   Glucose, Bld 89 65 - 99 mg/dL   BUN 14 6 - 20 mg/dL   Creatinine, Ser 1.52 (H) 0.44 - 1.00 mg/dL   Calcium 8.6 (L) 8.9 - 10.3 mg/dL   Total Protein 6.8 6.5 - 8.1 g/dL   Albumin 3.7 3.5 - 5.0 g/dL   AST 18 15 - 41 U/L   ALT 19 14 - 54 U/L   Alkaline Phosphatase 50 38 - 126 U/L   Total Bilirubin 0.7 0.3 - 1.2 mg/dL   GFR calc non Af Amer 29 (L) >60 mL/min   GFR calc Af Amer 34 (L) >60 mL/min  Anion gap 6 5 - 15  Troponin I  Result Value Ref Range    Troponin I 0.03 (HH) <0.03 ng/mL  Brain natriuretic peptide  Result Value Ref Range   B Natriuretic Peptide 940.0 (H) 0.0 - 100.0 pg/mL  Protime-INR  Result Value Ref Range   Prothrombin Time 42.7 (H) 11.4 - 15.2 seconds   INR 4.33 (HH)    Dg Chest 2 View Result Date: 08/08/2015 CLINICAL DATA:  PT c/o SOB on exertion for several weeks and her PCP d/ced her metoprolol on this past Friday pain left neck and shoulder EXAM: CHEST  2 VIEW COMPARISON:  09/27/2012 FINDINGS: Heart is enlarged. There are bilateral pleural effusions. No pulmonary edema. No consolidations. Previous multilevel vertebroplasty at T9, T10, and T11. Numerous additional wedge compression fractures appear stable and involve T6, T7, T8, and T12. IMPRESSION: 1. Cardiomegaly. 2. Bilateral pleural effusions. 3. Multiple chronic vertebral fractures. Electronically Signed   By: Nolon Nations M.D.   On: 08/08/2015 16:19   Dg Clavicle Left Result Date: 08/08/2015 CLINICAL DATA:  Tenderness about the left clavicle for 2-3 weeks. No recent injury. EXAM: LEFT CLAVICLE - 2+ VIEWS COMPARISON:  None. FINDINGS: No acute bony or joint abnormality is seen. Remote healed mid clavicle fracture is noted. Bones are somewhat osteopenic. Imaged lung parenchyma is clear. Aortic atherosclerosis is noted. IMPRESSION: No acute abnormality. Remote healed left clavicle fracture. Electronically Signed   By: Inge Rise M.D.   On: 08/08/2015 17:51     1600:   BNP elevated with pleural effusions on CXR; will dose IV lasix. Troponin mildly elevated, but pt denies CP.  Pt insistent she "needs to take my coumadin now." Informed pt and family to hold dose at this time given elevated INR; all verb understanding. Dx and testing d/w pt and family.  Questions answered.  Verb understanding, agreeable to admit. T/C to [facility] Dr. Maudie Mercury, case discussed, including:  HPI, pertinent PM/SHx, VS/PE, dx testing, ED course and treatment:  Agreeable to admit, requests to write  temporary orders, obtain observation tele bed to team APAdmits.     Final Clinical Impressions(s) / ED Diagnoses   Final diagnoses:  None    New Prescriptions New Prescriptions   No medications on file     Francine Graven, DO 08/10/15 1529

## 2015-08-08 NOTE — ED Notes (Signed)
Hospitalist at the bedside 

## 2015-08-08 NOTE — H&P (Signed)
[facility] H&P   Patient Demographics:    Candace [name], is a 82 y.o. female  MRN: [id number]   DOB - 07/25/1922  Admit Date - 08/08/2015  Outpatient Primary MD for the patient is [name] Done, FNP,  Dr. Laurance Flatten, [address], [place], [place]  Referring MD/NP/PA: Wynne Dust  Outpatient Specialists:   Patient coming from: home  Chief Complaint  Patient presents with  . Shortness of Breath      HPI:    Candace [name]  is a 82 y.o. female, hypertension, hyperlipidemia, Aortic stenosis, (mild, in 2014) AR (mild-mod, in 2014),  apparently presents with c/o dyspnea x 1.5 weeks.  Pt was seen by pcp this past Friday and told that she was ok.  Taken off of a medication, but can't recall what medication was. Daughter states that it was metoprolol because her heart rate was slow.  + dry cough. Intermittent edema. Denies cp, palp, wt gain, orthopnea, pnd.  Pt presented to ED for evaluation of dyspnea.   In ED,  CXR showed bilateral effusions.  Pt trop was mildy elevated.  EKG, afib at 60 , st depression avf, flattening in the v5, v6. Pt denies cp, pt will be admitted for evaluation of dyspnea.     Review of systems:    In addition to the HPI above,  No Fever-chills, No Headache, No changes with Vision or hearing, No problems swallowing food or Liquids, No Chest pain. No Abdominal pain, No Nausea or Vommitting, Bowel movements are regular, No Blood in stool or Urine, No dysuria, No new skin rashes or bruises, No new joints pains-aches,  No new weakness, tingling, numbness in any extremity, No recent weight gain or loss, No polyuria, polydypsia or polyphagia, No significant Mental Stressors.  A full 10 point Review of Systems was done, except as stated above, all other Review of Systems were negative.   With Past History of the following :    Past Medical  History:  Diagnosis Date  . Atrial fibrillation ([place])   . Cancer ([place]) 2016   skin  . DVT (deep venous thrombosis) ([place])   . Glaucoma   . Hypercholesterolemia   . Hypertension   . PE (pulmonary embolism)       Past Surgical History:  Procedure Laterality Date  . CATARACT EXTRACTION    . CHOLECYSTECTOMY    . KYPHOPLASTY    . SKIN CANCER EXCISION Left 2016   neck      Social History:     Social History  Substance Use Topics  . Smoking status: Never Smoker  . Smokeless tobacco: Current User    Types: Snuff  . Alcohol use No     Lives - at home  Mobility - walk by self.      Family History :     Family History  Problem Relation Age of Onset  . Stroke  Father   . Heart attack Mother   . Diabetes Brother   . Heart disease Brother   . Early death Sister       Home Medications:   Prior to Admission medications   Medication Sig Start Date End Date Taking? Authorizing Provider  acetaminophen (TYLENOL) 500 MG tablet Take 500 mg by mouth as needed for pain.    Historical Provider, MD  amLODipine (NORVASC) 2.5 MG tablet Take 1 tablet (2.5 mg total) by mouth daily. 06/10/15   [name] Done, FNP  bimatoprost (LUMIGAN) 0.01 % SOLN Place 1 drop into both eyes at bedtime. 03/29/15   [name] Done, FNP  brimonidine (ALPHAGAN P) 0.1 % SOLN Apply 1 drop to eye 2 (two) times daily.     Historical Provider, MD  denosumab (PROLIA) 60 MG/ML SOLN injection INJECT 60 MG INTO THE SKIN EVERY 6 MONTHS. BRING TO OFFICE FOR ADMINSTRATION 02/24/15   Cherre Robins, PharmD  furosemide (LASIX) 20 MG tablet Take one tablet daily 07/28/15   [name] Done, FNP  gabapentin (NEURONTIN) 100 MG capsule Take 1 capsule (100 mg total) by mouth 3 (three) times daily. 05/05/15   [name] Done, FNP  lisinopril (PRINIVIL,ZESTRIL) 40 MG tablet Take 1 tablet (40 mg total) by mouth daily. 06/10/15   [name] Done, FNP  simvastatin (ZOCOR) 40 MG tablet Take 1 tablet (40 mg total)  by mouth at bedtime. 06/10/15   [name] Done, FNP  warfarin (COUMADIN) 2 MG tablet TAKE ONE OR TWO TABLETS BY MOUTH DAILY AS DIRECTED AS INSTRUCTED BY ANTICOAGULATION 06/08/15   Fransisca Kaufmann Dettinger, MD     Allergies:     Allergies  Allergen Reactions  . Codeine Hives and Shortness Of Breath  . Fosamax [Alendronate Sodium] Hives and Nausea And Vomiting  . Sulfa Antibiotics Itching, Nausea And Vomiting and Rash  . Tramadol Other (See Comments)    Near syncope  . Boniva [Ibandronic Acid]   . Septra [Sulfamethoxazole-Trimethoprim]   . Vioxx [Rofecoxib]      Physical Exam:   Vitals  Blood pressure 156/79, pulse 69, temperature 97.8 F (36.6 C), temperature source Oral, resp. rate 16, height 5\' 1"  (1.549 m), weight 44.5 kg (98 lb), SpO2 98 %.   1. General  lying in bed in NAD,    2. Normal affect and insight, Not Suicidal or Homicidal, Awake Alert, Oriented X 3.  3. No F.N deficits, ALL C.Nerves Intact, Strength 5/5 all 4 extremities, Sensation intact all 4 extremities, Plantars down going.  4. Ears and Eyes appear Normal, Conjunctivae clear, PERRLA. Moist Oral Mucosa.  5. Supple Neck, No JVD, No cervical lymphadenopathy appriciated, No Carotid Bruits.  6. Symmetrical Chest wall movement, Good air movement bilaterally, CTAB.  7. Irr, Irr, s1, s2, 2/6 sem rusb. No Parasternal Heave.  8. Positive Bowel Sounds, Abdomen Soft, No tenderness, No organomegaly appriciated,No rebound -guarding or rigidity.  9.  No Cyanosis, Normal Skin Turgor, No Skin Rash or Bruise.  10. Good muscle tone,  joints appear normal , no effusions, Normal ROM.  11. No Palpable Lymph Nodes in Neck or Axillae     Data Review:    CBC  Recent Labs Lab 08/08/15 1548  WBC 4.7  HGB 12.4  HCT 38.5  PLT 239  MCV 90.2  MCH 29.0  MCHC 32.2  RDW 15.2  LYMPHSABS 0.7  MONOABS 0.5  EOSABS 0.1  BASOSABS 0.0    ------------------------------------------------------------------------------------------------------------------  Chemistries   Recent Labs Lab 08/08/15 1548  NA 139  K 3.7  CL 108  CO2 25  GLUCOSE 89  BUN 14  CREATININE 1.52*  CALCIUM 8.6*  AST 18  ALT 19  ALKPHOS 50  BILITOT 0.7   ------------------------------------------------------------------------------------------------------------------ estimated creatinine clearance is 17.3 mL/min (by C-G formula based on SCr of 1.52 mg/dL). ------------------------------------------------------------------------------------------------------------------ No results for input(s): TSH, T4TOTAL, T3FREE, THYROIDAB in the last 72 hours.  Invalid input(s): FREET3  Coagulation profile  Recent Labs Lab 08/08/15 1548  INR 4.33*   ------------------------------------------------------------------------------------------------------------------- No results for input(s): DDIMER in the last 72 hours. -------------------------------------------------------------------------------------------------------------------  Cardiac Enzymes  Recent Labs Lab 08/08/15 1548  TROPONINI 0.03*   ------------------------------------------------------------------------------------------------------------------    Component Value Date/Time   BNP 940.0 (H) 08/08/2015 1548     ---------------------------------------------------------------------------------------------------------------  Urinalysis    Component Value Date/Time   COLORURINE YELLOW 06/06/2009 2144   APPEARANCEUR CLEAR 06/06/2009 2144   LABSPEC 1.015 06/06/2009 2144   PHURINE 5.5 06/06/2009 2144   GLUCOSEU NEGATIVE 06/06/2009 2144   HGBUR TRACE (A) 06/06/2009 2144   BILIRUBINUR NEGATIVE 06/06/2009 2144   [place] 06/06/2009 2144   PROTEINUR NEGATIVE 06/06/2009 2144   UROBILINOGEN 0.2 06/06/2009 2144   NITRITE NEGATIVE 06/06/2009 2144   LEUKOCYTESUR SMALL (A)  06/06/2009 2144    ----------------------------------------------------------------------------------------------------------------   Imaging Results:    Dg Chest 2 View  Result Date: 08/08/2015 CLINICAL DATA:  PT c/o SOB on exertion for several weeks and her PCP d/ced her metoprolol on this past Friday pain left neck and shoulder EXAM: CHEST  2 VIEW COMPARISON:  09/27/2012 FINDINGS: Heart is enlarged. There are bilateral pleural effusions. No pulmonary edema. No consolidations. Previous multilevel vertebroplasty at T9, T10, and T11. Numerous additional wedge compression fractures appear stable and involve T6, T7, T8, and T12. IMPRESSION: 1. Cardiomegaly. 2. Bilateral pleural effusions. 3. Multiple chronic vertebral fractures. Electronically Signed   By: Nolon Nations M.D.   On: 08/08/2015 16:19   Dg Clavicle Left  Result Date: 08/08/2015 CLINICAL DATA:  Tenderness about the left clavicle for 2-3 weeks. No recent injury. EXAM: LEFT CLAVICLE - 2+ VIEWS COMPARISON:  None. FINDINGS: No acute bony or joint abnormality is seen. Remote healed mid clavicle fracture is noted. Bones are somewhat osteopenic. Imaged lung parenchyma is clear. Aortic atherosclerosis is noted. IMPRESSION: No acute abnormality. Remote healed left clavicle fracture. Electronically Signed   By: Inge Rise M.D.   On: 08/08/2015 17:51      Assessment & Plan:    Active Problems:   Atrial fibrillation (HCC)   HTN (hypertension)   Aortic stenosis   Dyspnea   Troponin level elevated   Renal insufficiency    1. Dyspnea ? Aortic stenosis./ vs MR Check cardiac echo.  Consider PFT as outpatient r/o Copd  Elevation in trop ? Due to renal insufficiency Check trop I q6h x3.  Cardiology consultation in am  Pleural effusions without CHF Check cardiac echo.   Renal insufficiency Unclear what her baseline creatinine is.  Please try to obtain records in am Check cmp in am  Pafib (chadsvasc 2= 4?)   Coumadin  pharmacy to dose , Hold coumadin temporarily due to coagulopathy  DVT Prophylaxis Heparin - SCDs  AM Labs Ordered, also please review Full Orders  Family Communication: Admission, patients condition and plan of care including tests being ordered have been discussed with the patient and son Cheril Droz  who indicate understanding and agree with the plan and Code Status.  Code Status DNR  Likely DC to  home  Condition GUARDED    Consults  called:  cardiology  Admission status: observation  Time spent in minutes : 45 minutes   Jani Gravel M.D on 08/08/2015 at 6:13 PM  Between 7am to 7pm - Pager - [phone]   After 7pm go to [email or web address] - password [facility]  [facility] - Office  [phone]

## 2015-08-08 NOTE — Telephone Encounter (Signed)
Agree with ED given the complaint.

## 2015-08-08 NOTE — ED Triage Notes (Signed)
PT c/o SOB on exertion for several weeks and her PCP d/ced her metoprolol on this past Friday 08/05/15 d/t HR of 46 in office. Heart rate today 69. PT denies any SOB at rest and also states left shoulder pain at times.

## 2015-08-08 NOTE — ED Notes (Signed)
CRITICAL VALUE ALERT  Critical value received:  INR 4.33  Date of notification:  08/08/15  Time of notification:  1800  Critical value read back:Yes.    Nurse who received alert:  [place]  Dr Thurnell Garbe at the bedside

## 2015-08-08 NOTE — ED Notes (Signed)
CRITICAL VALUE ALERT  Critical value received:  Troponin 0.03  Date of notification: 08/08/2015 Time of notification:  1628  Critical value read back:Yes.    Nurse who received alert:  Lana Fish RN  MD notified (1st page):  Dr. Thurnell Garbe Time of first page:  1628  MD notified (2nd page):  Time of second page:  Responding MD:  Dr. Thurnell Garbe  Time MD responded:  [phone]

## 2015-08-09 ENCOUNTER — Observation Stay (HOSPITAL_BASED_OUTPATIENT_CLINIC_OR_DEPARTMENT_OTHER): Payer: Medicare Other

## 2015-08-09 DIAGNOSIS — N183 Chronic kidney disease, stage 3 unspecified: Secondary | ICD-10-CM | POA: Diagnosis present

## 2015-08-09 DIAGNOSIS — I509 Heart failure, unspecified: Secondary | ICD-10-CM

## 2015-08-09 DIAGNOSIS — I4891 Unspecified atrial fibrillation: Secondary | ICD-10-CM | POA: Diagnosis not present

## 2015-08-09 DIAGNOSIS — I10 Essential (primary) hypertension: Secondary | ICD-10-CM

## 2015-08-09 DIAGNOSIS — I350 Nonrheumatic aortic (valve) stenosis: Secondary | ICD-10-CM | POA: Diagnosis not present

## 2015-08-09 LAB — COMPREHENSIVE METABOLIC PANEL
ALT: 17 U/L (ref 14–54)
AST: 18 U/L (ref 15–41)
Albumin: 3.4 g/dL — ABNORMAL LOW (ref 3.5–5.0)
Alkaline Phosphatase: 46 U/L (ref 38–126)
Anion gap: 6 (ref 5–15)
BILIRUBIN TOTAL: 0.7 mg/dL (ref 0.3–1.2)
BUN: 14 mg/dL (ref 6–20)
CHLORIDE: 105 mmol/L (ref 101–111)
CO2: 29 mmol/L (ref 22–32)
CREATININE: 1.5 mg/dL — AB (ref 0.44–1.00)
Calcium: 8.3 mg/dL — ABNORMAL LOW (ref 8.9–10.3)
GFR calc Af Amer: 34 mL/min — ABNORMAL LOW (ref >60)
GFR, EST NON AFRICAN AMERICAN: 29 mL/min — AB (ref >60)
Glucose, Bld: 99 mg/dL (ref 65–99)
Potassium: 3.7 mmol/L (ref 3.5–5.1)
Sodium: 140 mmol/L (ref 135–145)
Total Protein: 6.4 g/dL — ABNORMAL LOW (ref 6.5–8.1)

## 2015-08-09 LAB — TROPONIN I
TROPONIN I: 0.03 ng/mL — AB (ref <0.03)
Troponin I: 0.03 ng/mL (ref <0.03)
Troponin I: 0.03 ng/mL (ref <0.03)

## 2015-08-09 LAB — CBC WITH DIFFERENTIAL/PLATELET
Basophils Absolute: 0 10*3/uL (ref 0.0–0.1)
Basophils Relative: 0 %
EOS ABS: 0.1 10*3/uL (ref 0.0–0.7)
EOS PCT: 2 %
HCT: 39.9 % (ref 36.0–46.0)
Hemoglobin: 12.9 g/dL (ref 12.0–15.0)
LYMPHS ABS: 0.7 10*3/uL (ref 0.7–4.0)
Lymphocytes Relative: 13 %
MCH: 29.3 pg (ref 26.0–34.0)
MCHC: 32.3 g/dL (ref 30.0–36.0)
MCV: 90.7 fL (ref 78.0–100.0)
MONO ABS: 0.8 10*3/uL (ref 0.1–1.0)
Monocytes Relative: 14 %
Neutro Abs: 3.9 10*3/uL (ref 1.7–7.7)
Neutrophils Relative %: 71 %
PLATELETS: 266 10*3/uL (ref 150–400)
RBC: 4.4 MIL/uL (ref 3.87–5.11)
RDW: 15.2 % (ref 11.5–15.5)
WBC: 5.6 10*3/uL (ref 4.0–10.5)

## 2015-08-09 LAB — TSH: TSH: 4.2 u[IU]/mL (ref 0.350–4.500)

## 2015-08-09 LAB — CK TOTAL AND CKMB (NOT AT ARMC)
CK, MB: 2.7 ng/mL (ref 0.5–5.0)
Relative Index: INVALID (ref 0.0–2.5)
Total CK: 51 U/L (ref 38–234)

## 2015-08-09 MED ORDER — SODIUM CHLORIDE 0.9% FLUSH
3.0000 mL | Freq: Two times a day (BID) | INTRAVENOUS | Status: DC
  Administered 2015-08-09 – 2015-08-11 (×2): 3 mL via INTRAVENOUS

## 2015-08-09 MED ORDER — ACETAMINOPHEN 650 MG RE SUPP: 650.0000 mg | Freq: Four times a day (QID) | RECTAL | Status: DC | PRN

## 2015-08-09 MED ORDER — BRIMONIDINE TARTRATE 0.15 % OP SOLN
1.0000 [drp] | Freq: Two times a day (BID) | OPHTHALMIC | Status: DC
  Administered 2015-08-09 – 2015-08-11 (×5): 1 [drp] via OPHTHALMIC
  Filled 2015-08-09: qty 5

## 2015-08-09 MED ORDER — GABAPENTIN 100 MG PO CAPS
100.0000 mg | ORAL_CAPSULE | Freq: Three times a day (TID) | ORAL | Status: DC
  Administered 2015-08-09 – 2015-08-11 (×7): 100 mg via ORAL
  Filled 2015-08-09 (×7): qty 1

## 2015-08-09 MED ORDER — BRIMONIDINE TARTRATE 0.2 % OP SOLN
OPHTHALMIC | Status: AC
  Filled 2015-08-09: qty 5

## 2015-08-09 MED ORDER — SODIUM CHLORIDE 0.9 % IV SOLN: 250.0000 mL | INTRAVENOUS | Status: DC | PRN

## 2015-08-09 MED ORDER — AMLODIPINE BESYLATE 5 MG PO TABS
2.5000 mg | ORAL_TABLET | Freq: Every day | ORAL | Status: DC
  Administered 2015-08-09 – 2015-08-10 (×2): 2.5 mg via ORAL
  Filled 2015-08-09 (×3): qty 1

## 2015-08-09 MED ORDER — LISINOPRIL 10 MG PO TABS
40.0000 mg | ORAL_TABLET | Freq: Every day | ORAL | Status: DC
  Administered 2015-08-09 – 2015-08-10 (×2): 40 mg via ORAL
  Filled 2015-08-09 (×3): qty 4

## 2015-08-09 MED ORDER — ACETAMINOPHEN 325 MG PO TABS
650.0000 mg | ORAL_TABLET | Freq: Four times a day (QID) | ORAL | Status: DC | PRN
Start: 2015-08-09 — End: 2015-08-11

## 2015-08-09 MED ORDER — SIMVASTATIN 20 MG PO TABS
40.0000 mg | ORAL_TABLET | Freq: Every day | ORAL | Status: DC
  Administered 2015-08-09 – 2015-08-10 (×2): 40 mg via ORAL
  Filled 2015-08-09 (×2): qty 2

## 2015-08-09 MED ORDER — SODIUM CHLORIDE 0.9% FLUSH
3.0000 mL | Freq: Two times a day (BID) | INTRAVENOUS | Status: DC
  Administered 2015-08-09 – 2015-08-10 (×3): 3 mL via INTRAVENOUS

## 2015-08-09 MED ORDER — FUROSEMIDE 20 MG PO TABS
20.0000 mg | ORAL_TABLET | Freq: Every day | ORAL | Status: DC
  Administered 2015-08-09: 20 mg via ORAL
  Filled 2015-08-09: qty 1

## 2015-08-09 MED ORDER — LATANOPROST 0.005 % OP SOLN
1.0000 [drp] | Freq: Every day | OPHTHALMIC | Status: DC
  Administered 2015-08-09 – 2015-08-10 (×2): 1 [drp] via OPHTHALMIC
  Filled 2015-08-09: qty 2.5

## 2015-08-09 MED ORDER — WARFARIN - PHARMACIST DOSING INPATIENT
Status: DC
Start: 2015-08-10 — End: 2015-08-11
  Administered 2015-08-10: 16:00:00

## 2015-08-09 MED ORDER — LATANOPROST 0.005 % OP SOLN
OPHTHALMIC | Status: AC
  Filled 2015-08-09: qty 2.5

## 2015-08-09 MED ORDER — SODIUM CHLORIDE 0.9% FLUSH: 3.0000 mL | INTRAVENOUS | Status: DC | PRN

## 2015-08-09 MED ORDER — FUROSEMIDE 10 MG/ML IJ SOLN
20.0000 mg | Freq: Two times a day (BID) | INTRAMUSCULAR | Status: DC
  Administered 2015-08-09 – 2015-08-11 (×4): 20 mg via INTRAVENOUS
  Filled 2015-08-09 (×4): qty 2

## 2015-08-09 NOTE — Care Management Obs Status (Signed)
MEDICARE OBSERVATION STATUS NOTIFICATION   Patient Details  Name: Candace [name] MRN: [id number] Date of Birth: 14-Dec-1922   Medicare Observation Status Notification Given:  Yes    Sherald Barge, RN 08/09/2015, 9:25 AM

## 2015-08-09 NOTE — Progress Notes (Signed)
PROGRESS NOTE    Candace [name]  [id number] DOB: December 28, 1922 DOA: 08/08/2015 PCP: Chevis Pretty, FNP Outpatient Specialists: None    Brief Narrative:  54 yof presented with complaints of dyspnea for 1.5 weeks. While being evaluated I the ED, CXR showed bilateral effusions and her troponin was mildly elevated. EKG showed afib. She was admitted for further evaluation of her dyspnea.    Assessment & Plan:   Active Problems:   Atrial fibrillation (HCC)   HTN (hypertension)   Aortic stenosis   Dyspnea   Elevated troponin   Renal insufficiency  1. Atrial fibrillation. Chronically anticoagulated with Coumadin. CHADS score 4. Heart rate stable. 2. Elevated troponin. Likely demand ischemia in the setting of volume overload. Follow up echo. No complaints of chest pain. Would be very conservative with work up. 3. Acute chf. Last EF on echo from 2014 was normal. Repeat echo pending. She does have evidence of volume overload with pedal edema and pleural effusions. Will continue IV lasix for now.. Check ECHO. Continue lasix. She is on ACE. Not a candidate for BB due to bradycardia 4. CKD stage 3. Creatinine is near baseline. Continue to follow with diuresis. 5. HTN. Continue antihypertensives.   6. HLD. Continue statins. 7. Aortic stenosis. Mild to moderate AS on last echo. Will re evaluate with updated echo  DVT prophylaxis: heparin Code Status: DNR Family Communication: Discussed with patient Disposition Plan: discharge home once improved   Consultants:     Procedures:   none  Antimicrobials:  none    Subjective: Patient is confused. Denies any shortness of breath  Objective: Vitals:   08/08/15 1900 08/08/15 1930 08/08/15 2053 08/09/15 0431  BP: 142/75 153/70 (!) 150/87 135/61  Pulse: 72 70 (!) 59 (!) 58  Resp: 23 22  20   Temp:   98 F (36.7 C) 98.5 F (36.9 C)  TempSrc:   Oral Oral  SpO2: 97% 97% 97% 97%  Weight:   44.5 kg (98 lb) 43.4 kg (95 lb 9.6  oz)  Height:   4\' 9"  (1.448 m) 5\' 1"  (1.549 m)    Intake/Output Summary (Last 24 hours) at 08/09/15 0840 Last data filed at 08/09/15 0431  Gross per 24 hour  Intake              240 ml  Output              800 ml  Net             -560 ml   Filed Weights   08/08/15 1518 08/08/15 2053 08/09/15 0431  Weight: 44.5 kg (98 lb) 44.5 kg (98 lb) 43.4 kg (95 lb 9.6 oz)    Examination:  General exam: Appears calm and comfortable  Respiratory system: bilateral crackles. Respiratory effort normal. Cardiovascular system: S1 & S2 heard, RRR. No JVD, murmurs, rubs, gallops or clicks. 1+ pedal edema. Gastrointestinal system: Abdomen is nondistended, soft and nontender. No organomegaly or masses felt. Normal bowel sounds heard. Central nervous system: Alert and oriented. No focal neurological deficits. Extremities: Symmetric 5 x 5 power. Skin: No rashes, lesions or ulcers Psychiatry: confused.     Data Reviewed: I have personally reviewed following labs and imaging studies  CBC:  Recent Labs Lab 08/08/15 1548 08/09/15 0541  WBC 4.7 5.6  NEUTROABS 3.5 3.9  HGB 12.4 12.9  HCT 38.5 39.9  MCV 90.2 90.7  PLT 239 [id number]   Basic Metabolic Panel:  Recent Labs Lab 08/08/15 1548 08/09/15 0541  NA 139  140  K 3.7 3.7  CL 108 105  CO2 25 29  GLUCOSE 89 99  BUN 14 14  CREATININE 1.52* 1.50*  CALCIUM 8.6* 8.3*   GFR: Estimated Creatinine Clearance: 17.1 mL/min (by C-G formula based on SCr of 1.5 mg/dL). Liver Function Tests:  Recent Labs Lab 08/08/15 1548 08/09/15 0541  AST 18 18  ALT 19 17  ALKPHOS 50 46  BILITOT 0.7 0.7  PROT 6.8 6.4*  ALBUMIN 3.7 3.4*   Coagulation Profile:  Recent Labs Lab 08/08/15 1548  INR 4.33*   Cardiac Enzymes:  Recent Labs Lab 08/08/15 1548 08/09/15 0058 08/09/15 0542  TROPONINI 0.03* 0.03* 0.03*   Thyroid Function Tests:  Recent Labs  08/09/15 0058  TSH 4.200   Anemia Panel: No results for input(s): VITAMINB12, FOLATE,  FERRITIN, TIBC, IRON, RETICCTPCT in the last 72 hours. Urine analysis:    Component Value Date/Time   COLORURINE YELLOW 06/06/2009 2144   APPEARANCEUR CLEAR 06/06/2009 2144   LABSPEC 1.015 06/06/2009 2144   PHURINE 5.5 06/06/2009 2144   GLUCOSEU NEGATIVE 06/06/2009 2144   HGBUR TRACE (A) 06/06/2009 2144   BILIRUBINUR NEGATIVE 06/06/2009 2144   KETONESUR NEGATIVE 06/06/2009 2144   PROTEINUR NEGATIVE 06/06/2009 2144   UROBILINOGEN 0.2 06/06/2009 2144   NITRITE NEGATIVE 06/06/2009 2144   LEUKOCYTESUR SMALL (A) 06/06/2009 2144   Radiology Studies: Dg Chest 2 View  Result Date: 08/08/2015 CLINICAL DATA:  PT c/o SOB on exertion for several weeks and her PCP d/ced her metoprolol on this past Friday pain left neck and shoulder EXAM: CHEST  2 VIEW COMPARISON:  09/27/2012 FINDINGS: Heart is enlarged. There are bilateral pleural effusions. No pulmonary edema. No consolidations. Previous multilevel vertebroplasty at T9, T10, and T11. Numerous additional wedge compression fractures appear stable and involve T6, T7, T8, and T12. IMPRESSION: 1. Cardiomegaly. 2. Bilateral pleural effusions. 3. Multiple chronic vertebral fractures. Electronically Signed   By: Nolon Nations M.D.   On: 08/08/2015 16:19   Dg Clavicle Left  Result Date: 08/08/2015 CLINICAL DATA:  Tenderness about the left clavicle for 2-3 weeks. No recent injury. EXAM: LEFT CLAVICLE - 2+ VIEWS COMPARISON:  None. FINDINGS: No acute bony or joint abnormality is seen. Remote healed mid clavicle fracture is noted. Bones are somewhat osteopenic. Imaged lung parenchyma is clear. Aortic atherosclerosis is noted. IMPRESSION: No acute abnormality. Remote healed left clavicle fracture. Electronically Signed   By: Inge Rise M.D.   On: 08/08/2015 17:51   Scheduled Meds: . amLODipine  2.5 mg Oral Daily  . brimonidine  1 drop Both Eyes BID  . furosemide  20 mg Oral Daily  . gabapentin  100 mg Oral TID  . latanoprost  1 drop Both Eyes QHS  .  lisinopril  40 mg Oral Daily  . simvastatin  40 mg Oral QHS  . sodium chloride flush  3 mL Intravenous Q12H  . sodium chloride flush  3 mL Intravenous Q12H   Continuous Infusions:    LOS: 0 days    Time spent: 25 minutes  Kathie Dike, MD [facility] Pager [phone]  If 7PM-7AM, please contact night-coverage [email or web address] Password [email or web address] 08/09/2015, 8:40 AM

## 2015-08-09 NOTE — Progress Notes (Signed)
*  PRELIMINARY RESULTS* Echocardiogram 2D Echocardiogram has been performed.  Samuel Germany 08/09/2015, 5:01 PM

## 2015-08-09 NOTE — Care Management Note (Signed)
Case Management Note  Patient Details  Name: Candace [name] MRN: [id number] Date of Birth: 11-Feb-1922  Subjective/Objective:                  Pt is from home, lives with her granddaughter and is ind with ADL's. Pt states her vision is bad and she can no longer drive but family takes her to appointments. She has no HH services or DME PTA. Pt has PCP and no difficulty affording her medications.  Action/Plan: Anticipate DC home today with no CM needs.   Expected Discharge Date:     08/09/2015             Expected Discharge Plan:  Home/Self Care  In-House Referral:  NA  Discharge planning Services     Post Acute Care Choice:  NA Choice offered to:  NA  DME Arranged:    DME Agency:     HH Arranged:    HH Agency:     Status of Service:  Completed, signed off  If discussed at [employer] of Stay Meetings, dates discussed:    Additional Comments:  Sherald Barge, RN 08/09/2015, 9:31 AM

## 2015-08-09 NOTE — Progress Notes (Signed)
Informed by central telemetry that patient had 8 beat run of VTach. Pt asymptomatic and denies chest pain or discomfort. MD E-Paged. Vital signs as follows:   08/09/15 1417  Vitals  Temp 98.2 F (36.8 C)  Temp Source Oral  BP (!) 132/54  BP Location Left Arm  BP Method Automatic  Patient Position (if appropriate) Lying  Pulse Rate (!) 50  Pulse Rate Source Dinamap  Resp 18  Oxygen Therapy  SpO2 100 %  O2 Device Room Air

## 2015-08-09 NOTE — Progress Notes (Signed)
ANTICOAGULATION CONSULT NOTE - Initial Consult  Pharmacy Consult for Coumadin (chronic Rx PTA) Indication: atrial fibrillation  Allergies  Allergen Reactions  . Codeine Hives and Shortness Of Breath  . Fosamax [Alendronate Sodium] Hives and Nausea And Vomiting  . Penicillins Hives  . Sulfa Antibiotics Itching, Nausea And Vomiting and Rash  . Tramadol Other (See Comments)    Near syncope  . Boniva [Ibandronic Acid]   . Septra [Sulfamethoxazole-Trimethoprim]   . Vioxx [Rofecoxib]     Patient Measurements: Height: 5\' 1"  (154.9 cm) Weight: 95 lb 9.6 oz (43.4 kg) IBW/kg (Calculated) : 47.8  Vital Signs: Temp: 98.5 F (36.9 C) (08/01 0431) Temp Source: Oral (08/01 0431) BP: 135/61 (08/01 0431) Pulse Rate: 58 (08/01 0431)  Labs:  Recent Labs  08/08/15 1548 08/09/15 0058 08/09/15 0541 08/09/15 0542  HGB 12.4  --  12.9  --   HCT 38.5  --  39.9  --   PLT 239  --  266  --   LABPROT 42.7*  --   --   --   INR 4.33*  --   --   --   CREATININE 1.52*  --  1.50*  --   TROPONINI 0.03* 0.03*  --  0.03*    Estimated Creatinine Clearance: 17.1 mL/min (by C-G formula based on SCr of 1.5 mg/dL).   Medical History: Past Medical History:  Diagnosis Date  . Atrial fibrillation ([place])   . Cancer ([place]) 2016   skin  . DVT (deep venous thrombosis) ([place])   . Glaucoma   . Hypercholesterolemia   . Hypertension   . PE (pulmonary embolism)     Medications:  Prescriptions Prior to Admission  Medication Sig Dispense Refill Last Dose  . acetaminophen (TYLENOL) 500 MG tablet Take 500 mg by mouth as needed for pain.   UNKNOWN  . amLODipine (NORVASC) 2.5 MG tablet Take 1 tablet (2.5 mg total) by mouth daily. 90 tablet 0 08/08/2015 at Unknown time  . bimatoprost (LUMIGAN) 0.01 % SOLN Place 1 drop into both eyes at bedtime. 1 Bottle 1 08/07/2015 at Unknown time  . brimonidine (ALPHAGAN P) 0.1 % SOLN Apply 1 drop to eye 2 (two) times daily.    08/08/2015 at Unknown time  . denosumab (PROLIA) 60  MG/ML SOLN injection INJECT 60 MG INTO THE SKIN EVERY 6 MONTHS. BRING TO OFFICE FOR ADMINSTRATION 1 each 0 UNKNOWN  . furosemide (LASIX) 20 MG tablet Take one tablet daily 90 tablet 1 08/08/2015 at Unknown time  . lisinopril (PRINIVIL,ZESTRIL) 40 MG tablet Take 1 tablet (40 mg total) by mouth daily. 90 tablet 1 08/08/2015 at Unknown time  . simvastatin (ZOCOR) 40 MG tablet Take 1 tablet (40 mg total) by mouth at bedtime. 90 tablet 1 Past Month at Unknown time  . warfarin (COUMADIN) 2 MG tablet TAKE ONE OR TWO TABLETS BY MOUTH DAILY AS DIRECTED AS INSTRUCTED BY ANTICOAGULATION (Patient taking differently: TAKE ONE TABLET BY MOUTH ONCE DAILY IN THE EVENING) 90 tablet 0 08/07/2015 at 1700    Assessment: 82yo female on chronic Coumadin PTA for h/o afib.  INR > 4 on admission late yesterday afternoon.  Hold Coumadin for now and f/u daily INR to evaluate Coumadin dosing.  No bleeding reported.  CBC OK.    Goal of Therapy:  INR 2-3 Monitor platelets by anticoagulation protocol: Yes   Plan:  HOLD Coumadin for now, allow INR to trend down INR daily Monitor for s/sx of bleeding complications  Hart Robinsons A 08/09/2015,11:14 AM

## 2015-08-10 ENCOUNTER — Encounter (HOSPITAL_COMMUNITY): Payer: Self-pay | Admitting: Cardiology

## 2015-08-10 DIAGNOSIS — I350 Nonrheumatic aortic (valve) stenosis: Secondary | ICD-10-CM | POA: Diagnosis not present

## 2015-08-10 DIAGNOSIS — N183 Chronic kidney disease, stage 3 (moderate): Secondary | ICD-10-CM | POA: Diagnosis not present

## 2015-08-10 DIAGNOSIS — I4891 Unspecified atrial fibrillation: Secondary | ICD-10-CM | POA: Diagnosis not present

## 2015-08-10 DIAGNOSIS — I509 Heart failure, unspecified: Secondary | ICD-10-CM | POA: Diagnosis not present

## 2015-08-10 LAB — ECHOCARDIOGRAM COMPLETE
AO mean calculated velocity dopler: 225 cm/s
AOVTI: 83.3 cm
AV Area VTI index: 0.38 cm2/m2
AV Area VTI: 0.46 cm2
AV Mean grad: 26 mmHg
AV Peak grad: 53 mmHg
AV pk vel: 364 cm/s
AVAREAMEANV: 0.5 cm2
AVAREAMEANVIN: 0.37 cm2/m2
AVCELMEANRAT: 0.22
Ao pk vel: 0.2 m/s
CHL CUP AV PEAK INDEX: 0.34
CHL CUP AV VEL: 0.52
CHL CUP DOP CALC LVOT VTI: 18.9 cm
CHL CUP REG VEL DIAS: 125 cm/s
CHL CUP TV REG PEAK VELOCITY: 385 cm/s
FS: 32 % (ref 28–44)
HEIGHTINCHES: 61 in
IV/PV OW: 0.99
LADIAMINDEX: 3.6 cm/m2
LASIZE: 49 mm
LAVOL: 87.2 mL
LAVOLA4C: 105 mL
LAVOLIN: 64.1 mL/m2
LEFT ATRIUM END SYS DIAM: 49 mm
LV dias vol: 33 mL — AB (ref 46–106)
LVDIAVOLIN: 24 mL/m2
LVOT area: 2.27 cm2
LVOT diameter: 17 mm
LVOT peak grad rest: 2 mmHg
LVOTPV: 73.9 cm/s
LVOTSV: 43 mL
LVOTVTI: 0.23 cm
Lateral S' vel: 10.5 cm/s
MV VTI: 182 cm
PISA EROA: 0.05 cm2
PV Reg grad dias: 6 mmHg
PW: 11.2 mm — AB (ref 0.6–1.1)
RV sys press: 62 mmHg
TAPSE: 17.3 mm
TRMAXVEL: 385 cm/s
Valve area index: 0.38
Valve area: 0.52 cm2
WEIGHTICAEL: 1529.6 [oz_av]

## 2015-08-10 LAB — BASIC METABOLIC PANEL
Anion gap: 6 (ref 5–15)
BUN: 16 mg/dL (ref 6–20)
CHLORIDE: 106 mmol/L (ref 101–111)
CO2: 26 mmol/L (ref 22–32)
CREATININE: 1.62 mg/dL — AB (ref 0.44–1.00)
Calcium: 7.9 mg/dL — ABNORMAL LOW (ref 8.9–10.3)
GFR calc Af Amer: 31 mL/min — ABNORMAL LOW (ref >60)
GFR calc non Af Amer: 27 mL/min — ABNORMAL LOW (ref >60)
GLUCOSE: 87 mg/dL (ref 65–99)
Potassium: 4 mmol/L (ref 3.5–5.1)
SODIUM: 138 mmol/L (ref 135–145)

## 2015-08-10 LAB — CBC
HCT: 36 % (ref 36.0–46.0)
HEMOGLOBIN: 11.6 g/dL — AB (ref 12.0–15.0)
MCH: 29.1 pg (ref 26.0–34.0)
MCHC: 32.2 g/dL (ref 30.0–36.0)
MCV: 90.5 fL (ref 78.0–100.0)
PLATELETS: 217 10*3/uL (ref 150–400)
RBC: 3.98 MIL/uL (ref 3.87–5.11)
RDW: 15.3 % (ref 11.5–15.5)
WBC: 4.8 10*3/uL (ref 4.0–10.5)

## 2015-08-10 LAB — PROTIME-INR
INR: 2.91
PROTHROMBIN TIME: 31 s — AB (ref 11.4–15.2)

## 2015-08-10 LAB — MAGNESIUM: MAGNESIUM: 2 mg/dL (ref 1.7–2.4)

## 2015-08-10 MED ORDER — WARFARIN SODIUM 1 MG PO TABS
1.0000 mg | ORAL_TABLET | Freq: Once | ORAL | Status: AC
  Administered 2015-08-10: 1 mg via ORAL
  Filled 2015-08-10: qty 1

## 2015-08-10 NOTE — Progress Notes (Signed)
[facility] for Coumadin (chronic Rx PTA) Indication: atrial fibrillation  Allergies  Allergen Reactions  . Codeine Hives and Shortness Of Breath  . Fosamax [Alendronate Sodium] Hives and Nausea And Vomiting  . Penicillins Hives  . Sulfa Antibiotics Itching, Nausea And Vomiting and Rash  . Tramadol Other (See Comments)    Near syncope  . Boniva [Ibandronic Acid]   . Septra [Sulfamethoxazole-Trimethoprim]   . Vioxx [Rofecoxib]     Patient Measurements: Height: 5\' 1"  (154.9 cm) Weight: 95 lb 9.6 oz (43.4 kg) IBW/kg (Calculated) : 47.8  Vital Signs: Temp: 97.6 F (36.4 C) (08/02 0629) Temp Source: Oral (08/02 0629) BP: 125/55 (08/02 0629) Pulse Rate: 42 (08/02 0629)  Labs:  Recent Labs  08/08/15 1548 08/09/15 0058 08/09/15 0541 08/09/15 0542 08/09/15 1221 08/10/15 0552  HGB 12.4  --  12.9  --   --  11.6*  HCT 38.5  --  39.9  --   --  36.0  PLT 239  --  266  --   --  217  LABPROT 42.7*  --   --   --   --  31.0*  INR 4.33*  --   --   --   --  2.91  CREATININE 1.52*  --  1.50*  --   --  1.62*  CKTOTAL  --  51  --   --   --   --   CKMB  --  2.7  --   --   --   --   TROPONINI 0.03* 0.03*  --  0.03* 0.03*  --     Estimated Creatinine Clearance: 15.8 mL/min (by C-G formula based on SCr of 1.62 mg/dL).   Medical History: Past Medical History:  Diagnosis Date  . Atrial fibrillation ([place])   . Cancer ([place]) 2016   skin  . DVT (deep venous thrombosis) ([place])   . Glaucoma   . Hypercholesterolemia   . Hypertension   . PE (pulmonary embolism)     Medications:  Prescriptions Prior to Admission  Medication Sig Dispense Refill Last Dose  . acetaminophen (TYLENOL) 500 MG tablet Take 500 mg by mouth as needed for pain.   UNKNOWN  . amLODipine (NORVASC) 2.5 MG tablet Take 1 tablet (2.5 mg total) by mouth daily. 90 tablet 0 08/08/2015 at Unknown time  . bimatoprost (LUMIGAN) 0.01 % SOLN Place 1 drop into both eyes at bedtime. 1 Bottle 1  08/07/2015 at Unknown time  . brimonidine (ALPHAGAN P) 0.1 % SOLN Apply 1 drop to eye 2 (two) times daily.    08/08/2015 at Unknown time  . denosumab (PROLIA) 60 MG/ML SOLN injection INJECT 60 MG INTO THE SKIN EVERY 6 MONTHS. BRING TO OFFICE FOR ADMINSTRATION 1 each 0 UNKNOWN  . furosemide (LASIX) 20 MG tablet Take one tablet daily 90 tablet 1 08/08/2015 at Unknown time  . lisinopril (PRINIVIL,ZESTRIL) 40 MG tablet Take 1 tablet (40 mg total) by mouth daily. 90 tablet 1 08/08/2015 at Unknown time  . simvastatin (ZOCOR) 40 MG tablet Take 1 tablet (40 mg total) by mouth at bedtime. 90 tablet 1 Past Month at Unknown time  . warfarin (COUMADIN) 2 MG tablet TAKE ONE OR TWO TABLETS BY MOUTH DAILY AS DIRECTED AS INSTRUCTED BY ANTICOAGULATION (Patient taking differently: TAKE ONE TABLET BY MOUTH ONCE DAILY IN THE EVENING) 90 tablet 0 08/07/2015 at 1700    Assessment: 82yo female on chronic Coumadin PTA for h/o afib.  INR > 4 on  admission late yesterday afternoon.  INR today 2.91   No bleeding reported.  CBC stable  Goal of Therapy:  INR 2-3 Monitor platelets by anticoagulation protocol: Yes   Plan: Coumadin 1 mg po today INR daily Monitor for s/sx of bleeding complications  Vinie Charity Poteet 08/10/2015,8:52 AM

## 2015-08-10 NOTE — Progress Notes (Signed)
PROGRESS NOTE  Candace [name] [id number] DOB: 14-Jan-1922 DOA: 08/08/2015 PCP: Chevis Pretty, FNP  Brief Narrative: 82 year old woman presented with complaints of dyspnea for one and a half weeks. While in the ED, evaluation revealed bilateral effusions and a mildly elevated troponin. Her EKG showed afib. She was admitted for further management of atrial fibrillation.  Assessment/Plan: 1. Acute valvular CHF (aortic stenosis, tricuspid regurgitation). Cannot exclude diastolic dysfunction secondary to atrial fibrillation. Appears euvolemic at this point. Not currently a candidate for beta blocker secondary to bradycardia. 2. Severe calcific aortic stenosis. Asymptomatic at this point. 3. Moderate to severe tricuspid regurgitation 4. Elevated troponin consistent with demand ischemia secondary to CHF. No evidence of ACS. 5. Atrial fibrillation. Heart rate stable. Chronically anticoagulated with Coumadin. CHA2DS2-VASc score 5. INR  therapeutic. 6. CKD stage 3. Creatinine mildly elevated in the setting of diuresis.  7. HTN. Continue antihypertensives   Now asymptomatic. Acute issues appear resolved however did have an episode of nonsustained ventricular tachycardia as well as atrial fibrillation with RVR in the context of persistent bradycardia. Concern for sick sinus syndrome. Potassium replaced, magnesium within normal limits.  Check BMP in the morning. Continue telemetry. Consult cardiology in the morning for further recommendations.  DVT prophylaxis: heparin Code Status: DNR Family Communication: Discussed with patient, son, and daughter-in-law Disposition Plan: Likely home 8/3  Murray Hodgkins, MD  [facility] Direct contact: [phone] --[place]ia [place]  --[email or web address]; password [email or web address]  7PM-7AM contact night coverage as above 08/10/2015, 6:15 AM  LOS: 0 days   Consultants:  none  Procedures:  ECHO Study Conclusions  - Left ventricle: The cavity size was  normal. Wall thickness was   increased in a pattern of mild LVH. Systolic function was   vigorous. The estimated ejection fraction was in the range of 65%   to 70%. Wall motion was normal; there were no regional wall   motion abnormalities. The study was not technically sufficient to   allow evaluation of LV diastolic dysfunction due to atrial   fibrillation. - Aortic valve: There was severe stenosis. Mean gradient (S): 26 mm   Hg. Peak gradient (S): 53 mm Hg. VTI ratio of LVOT to aortic   valve: 0.23. Valve area (VTI): 0.52 cm^2. Valve area (Vmax): 0.46   cm^2. - Mitral valve: Calcified annulus. There was mild regurgitation. - Left atrium: The atrium was severely dilated. - Right ventricle: The cavity size was mildly dilated. - Right atrium: The atrium was severely dilated. Central venous   pressure (est): 3 mm Hg. - Tricuspid valve: There was moderate-severe regurgitation. - Pulmonic valve: There was mild regurgitation. - Pulmonary arteries: Systolic pressure was severely increased. PA   peak pressure: 62 mm Hg (S). - Pericardium, extracardiac: Small to moderate circumferential   pericardial effusion, largest collection posteriorly and at the   base of the heart.  Antimicrobials:  none  HPI/Subjective: Feels well. Denies pain, nausea, or vomiting. Breathing and eating well. Denies any current complaints.   Objective: Vitals:   08/08/15 2053 08/09/15 0431 08/09/15 1417 08/09/15 2045  BP: (!) 150/87 135/61 (!) 132/54 (!) 125/59  Pulse: (!) 59 (!) 58 (!) 50 (!) 50  Resp:  20 18 20   Temp: 98 F (36.7 C) 98.5 F (36.9 C) 98.2 F (36.8 C) 99.6 F (37.6 C)  TempSrc: Oral Oral Oral Oral  SpO2: 97% 97% 100% 99%  Weight: 44.5 kg (98 lb) 43.4 kg (95 lb 9.6 oz)    Height: 4\' 9"  (1.448 m)  5\' 1"  (1.549 m)      Intake/Output Summary (Last 24 hours) at 08/10/15 0615 Last data filed at 08/09/15 1900  Gross per 24 hour  Intake              240 ml  Output              300 ml  Net               -60 ml     Filed Weights   08/08/15 1518 08/08/15 2053 08/09/15 0431  Weight: 44.5 kg (98 lb) 44.5 kg (98 lb) 43.4 kg (95 lb 9.6 oz)    Exam:    Constitutional:  . Appears calm and comfortable Eyes:  . PERRL and irises appear normal . Conjunctivae and lids appear normal ENMT:  . Normal lips and tongue Neck:  . neck appears normal, no masses, normal ROM, supple . no thyromegaly Respiratory:  . CTA bilaterally, no w/r/r.  . Respiratory effort normal. No retractions or accessory muscle use Cardiovascular:  . RRR, no rub or gallop. 3/6 systolic murmer. . No LE extremity edema   . Telemetry SR, NSVT, brief afib RVR Abdomen:  . Abdomen appears normal; no tenderness or masses Musculoskeletal:  o Moves all extremities. Psychiatric:  . judgement and insight appear normal . Mental status o Mood, affect appropriate o Orientation to person, place, time   I have personally reviewed following labs and imaging studies:  ECHO EF 65-70%.  Cr 1.62 with diuresis  Blood sugars stable  CBC unremarkable  Troponin flat  INR now therapeutic  Scheduled Meds: . amLODipine  2.5 mg Oral Daily  . brimonidine  1 drop Both Eyes BID  . furosemide  20 mg Intravenous BID  . gabapentin  100 mg Oral TID  . latanoprost  1 drop Both Eyes QHS  . lisinopril  40 mg Oral Daily  . simvastatin  40 mg Oral QHS  . sodium chloride flush  3 mL Intravenous Q12H  . sodium chloride flush  3 mL Intravenous Q12H  . Warfarin - Pharmacist Dosing Inpatient   Does not apply Q24H   Continuous Infusions:   Active Problems:   Atrial fibrillation (HCC)   HTN (hypertension)   Aortic stenosis   Dyspnea   Elevated troponin   Renal insufficiency   Acute CHF (congestive heart failure) (HCC)   CKD (chronic kidney disease) stage 3, GFR 30-59 ml/min   LOS: 0 days   Time spent 25 minutes  By signing my name below, I, Delene Ruffini, attest that this documentation has been prepared under the  direction and in the presence of Alitzel Cookson P. Sarajane Jews, MD. Electronically Signed: Delene Ruffini, Scribe.  08/10/15 11:55am    I personally performed the services described in this documentation. All medical record entries made by the scribe were at my direction. I have reviewed the chart and agree that the record reflects my personal performance and is accurate and complete. Murray Hodgkins, MD

## 2015-08-11 ENCOUNTER — Encounter (HOSPITAL_COMMUNITY): Payer: Self-pay | Admitting: Cardiology

## 2015-08-11 DIAGNOSIS — Z5181 Encounter for therapeutic drug level monitoring: Secondary | ICD-10-CM

## 2015-08-11 DIAGNOSIS — I5033 Acute on chronic diastolic (congestive) heart failure: Secondary | ICD-10-CM

## 2015-08-11 DIAGNOSIS — I495 Sick sinus syndrome: Secondary | ICD-10-CM | POA: Diagnosis not present

## 2015-08-11 DIAGNOSIS — N183 Chronic kidney disease, stage 3 (moderate): Secondary | ICD-10-CM

## 2015-08-11 DIAGNOSIS — I4729 Other ventricular tachycardia: Secondary | ICD-10-CM

## 2015-08-11 DIAGNOSIS — I482 Chronic atrial fibrillation: Secondary | ICD-10-CM

## 2015-08-11 DIAGNOSIS — I509 Heart failure, unspecified: Secondary | ICD-10-CM | POA: Diagnosis not present

## 2015-08-11 DIAGNOSIS — I472 Ventricular tachycardia: Secondary | ICD-10-CM

## 2015-08-11 DIAGNOSIS — Z7901 Long term (current) use of anticoagulants: Secondary | ICD-10-CM

## 2015-08-11 LAB — BASIC METABOLIC PANEL
ANION GAP: 5 (ref 5–15)
BUN: 20 mg/dL (ref 6–20)
CALCIUM: 8.1 mg/dL — AB (ref 8.9–10.3)
CO2: 27 mmol/L (ref 22–32)
Chloride: 101 mmol/L (ref 101–111)
Creatinine, Ser: 1.8 mg/dL — ABNORMAL HIGH (ref 0.44–1.00)
GFR, EST AFRICAN AMERICAN: 27 mL/min — AB (ref >60)
GFR, EST NON AFRICAN AMERICAN: 24 mL/min — AB (ref >60)
Glucose, Bld: 111 mg/dL — ABNORMAL HIGH (ref 65–99)
Potassium: 3.9 mmol/L (ref 3.5–5.1)
SODIUM: 133 mmol/L — AB (ref 135–145)

## 2015-08-11 LAB — PROTIME-INR
INR: 2.77
PROTHROMBIN TIME: 29.8 s — AB (ref 11.4–15.2)

## 2015-08-11 NOTE — Consult Note (Signed)
Reason for Consult:   Question tachy-brady syndrome, atrial fibrillation  Requesting Physician: Dr  Carlean Jews  Primary Cardiologist Dr  Minus Breeding  HPI:   Pleasant 82 y/o female followed at [facility] and by Dr Percival Spanish in the past. She has a history of AS (now severe by echo) with preserved LVEF and atrial fbrillation with CHADs VASc=5, on Coumadin Rx. She lives at home with her family. There is no history of syncope or near syncope (I spoke with pt's son Sanda Makinson [phone] to confirm this). Recently her PCP stopped her Metoprolol 12.5 mg BID because of bradycardia-HR in the 40's. She was admitted to [facility] 08/08/15 with dyspnea and found to have CHF with effusions on CXR and a BNP of 940. She improved after 1L diuresis and was to be discharged 08/10/15 but has had intermittent episodes of largely irregular NS WCT (rate 80-100) and was kept for Cardiology consult today. On interview, the pt can't remember why she came to the hospital or who her cardiologist is. She says she feels fine today, no chest pain or SOB.   PMHx:  Past Medical History:  Diagnosis Date  . Aortic stenosis   . Atrial fibrillation ([place])   . DVT (deep venous thrombosis) ([place])   . Essential hypertension   . Glaucoma   . History of skin cancer 2016  . Hypercholesterolemia   . PE (pulmonary embolism)     Past Surgical History:  Procedure Laterality Date  . CATARACT EXTRACTION    . CHOLECYSTECTOMY    . KYPHOPLASTY    . SKIN CANCER EXCISION Left 2016   neck    SOCHx:  reports that she has never smoked. Her smokeless tobacco use includes Snuff. She reports that she does not drink alcohol or use drugs.  FAMHx: Family History  Problem Relation Age of Onset  . Stroke Father   . Heart attack Mother   . Diabetes Brother   . Heart disease Brother   . Early death Sister     ALLERGIES: Allergies  Allergen Reactions  . Codeine Hives and Shortness Of Breath  . Fosamax [Alendronate Sodium] Hives and  Nausea And Vomiting  . Penicillins Hives  . Sulfa Antibiotics Itching, Nausea And Vomiting and Rash  . Tramadol Other (See Comments)    Near syncope  . Boniva [Ibandronic Acid]   . Septra [Sulfamethoxazole-Trimethoprim]   . Vioxx [Rofecoxib]     ROS: Review of Systems: Pt is unable to give ROS- "don't know" and "can't remember" were her answers. She denied any history of falls or syncope at home.   HOME MEDICATIONS: Prior to Admission medications   Medication Sig Start Date End Date Taking? Authorizing Provider  acetaminophen (TYLENOL) 500 MG tablet Take 500 mg by mouth as needed for pain.   Yes Historical Provider, MD  amLODipine (NORVASC) 2.5 MG tablet Take 1 tablet (2.5 mg total) by mouth daily. 06/10/15  Yes [name] Done, FNP  bimatoprost (LUMIGAN) 0.01 % SOLN Place 1 drop into both eyes at bedtime. 03/29/15  Yes [name] Done, FNP  brimonidine (ALPHAGAN P) 0.1 % SOLN Apply 1 drop to eye 2 (two) times daily.    Yes Historical Provider, MD  denosumab (PROLIA) 60 MG/ML SOLN injection INJECT 60 MG INTO THE SKIN EVERY 6 MONTHS. BRING TO OFFICE FOR ADMINSTRATION 02/24/15  Yes Cherre Robins, PharmD  furosemide (LASIX) 20 MG tablet Take one tablet daily 07/28/15  Yes [name] Done, FNP  lisinopril (PRINIVIL,ZESTRIL) 40 MG tablet Take 1 tablet (  40 mg total) by mouth daily. 06/10/15  Yes [name] Done, FNP  simvastatin (ZOCOR) 40 MG tablet Take 1 tablet (40 mg total) by mouth at bedtime. 06/10/15  Yes [name] Done, FNP  warfarin (COUMADIN) 2 MG tablet TAKE ONE OR TWO TABLETS BY MOUTH DAILY AS DIRECTED AS INSTRUCTED BY ANTICOAGULATION Patient taking differently: TAKE ONE TABLET BY MOUTH ONCE DAILY IN THE EVENING 06/08/15  Yes Fransisca Kaufmann Dettinger, MD    HOSPITAL MEDICATIONS: I have reviewed the patient's current medications.  VITALS: Blood pressure 107/60, pulse 62, temperature 97.6 F (36.4 C), temperature source Oral, resp. rate 16, height 5\' 1"  (1.549 m),  weight 97 lb 12.8 oz (44.4 kg), SpO2 98 %.  PHYSICAL EXAM: General appearance: alert, cooperative, appears stated age and no distress Neck: no JVD and bilateral transmitted murmur Lungs: significant kyphosis, few basilar crackles Heart: irregularly irregular rhythm and 2/6 holo systolic murmur LSB, AOV Abdomen: not distended, non tender Extremities: no edema Pulses: diminnished Skin: cool, pale, dry Neurologic: Grossly normal  LABS: Results for orders placed or performed during the hospital encounter of 08/08/15 (from the past 24 hour(s))  Protime-INR     Status: Abnormal   Collection Time: 08/11/15  5:18 AM  Result Value Ref Range   Prothrombin Time 29.8 (H) 11.4 - 15.2 seconds   INR [id number]   Basic metabolic panel     Status: Abnormal   Collection Time: 08/11/15  5:18 AM  Result Value Ref Range   Sodium 133 (L) 135 - 145 mmol/L   Potassium 3.9 3.5 - 5.1 mmol/L   Chloride 101 101 - 111 mmol/L   CO2 27 22 - 32 mmol/L   Glucose, Bld 111 (H) 65 - 99 mg/dL   BUN 20 6 - 20 mg/dL   Creatinine, Ser 1.80 (H) 0.44 - 1.00 mg/dL   Calcium 8.1 (L) 8.9 - 10.3 mg/dL   GFR calc non Af Amer 24 (L) >60 mL/min   GFR calc Af Amer 27 (L) >60 mL/min   Anion gap 5 5 - 15    EKG: Atrial fibrillation with aberrantly conducted complex, diffuse nonspecific ST changes and leftward axis.  IMAGING: CXR 08/08/15 CHEST  2 VIEW  COMPARISON:  09/27/2012  FINDINGS: Heart is enlarged. There are bilateral pleural effusions. No pulmonary edema. No consolidations. Previous multilevel vertebroplasty at T9, T10, and T11. Numerous additional wedge compression fractures appear stable and involve T6, T7, T8, and T12.  IMPRESSION: 1. Cardiomegaly. 2. Bilateral pleural effusions. 3. Multiple chronic vertebral fractures.  Echo 08/09/15 Study Conclusions  - Left ventricle: The cavity size was normal. Wall thickness was   increased in a pattern of mild LVH. Systolic function was   vigorous. The estimated  ejection fraction was in the range of 65%   to 70%. Wall motion was normal; there were no regional wall   motion abnormalities. The study was not technically sufficient to   allow evaluation of LV diastolic dysfunction due to atrial   fibrillation. - Aortic valve: There was severe stenosis. Mean gradient (S): 26 mm   Hg. Peak gradient (S): 53 mm Hg. VTI ratio of LVOT to aortic   valve: 0.23. Valve area (VTI): 0.52 cm^2. Valve area (Vmax): 0.46   cm^2. - Mitral valve: Calcified annulus. There was mild regurgitation. - Left atrium: The atrium was severely dilated. - Right ventricle: The cavity size was mildly dilated. - Right atrium: The atrium was severely dilated. Central venous   pressure (est): 3 mm Hg. - Tricuspid valve:  There was moderate-severe regurgitation. - Pulmonic valve: There was mild regurgitation. - Pulmonary arteries: Systolic pressure was severely increased. PA   peak pressure: 62 mm Hg (S). - Pericardium, extracardiac: Small to moderate circumferential   pericardial effusion, largest collection posteriorly and at the   base of the heart.  Impressions:  - Mild LVH with LVEF 65-70%. Indeterminate diastolic function in   the setting of atrial fibrillation. Severe biatrial enlargement.   Moderate MAC with mild mitral regurgitation. Severe calcific   aortic stenosis as outlined above. Mildly dilated RV with normal   contraction. Moderate to severe tricuspid regurgitation with PASP   62 mmHg. Small to moderate circumferential pericardial effusion   with largest collection posteriorly and at the base of the heart.   IMPRESSION: Principal Problem:   Acute congestive heart failure (HCC) Active Problems:   Atrial fibrillation (HCC)   HTN (hypertension)   Severe aortic stenosis   Dyspnea   Elevated troponin   CKD (chronic kidney disease) stage 3, GFR 30-59 ml/min   Sick sinus syndrome (HCC)   Non-sustained ventricular tachycardia (HCC)   Anticoagulated on  Coumadin   RECOMMENDATION: Will review with MD - she has bradycardia but its not clear she is symptomatic from this. After speaking with the pt and her son (POA) I get the sense they would consider a pacemaker if indicated in the future. No treatment at this time for NSWCT. MD to see this am.   Time Spent Directly with Patient: [address] minutes  [name], [place] beeper 08/11/2015, 9:08 AM    Attending note:  Patient seen and examined. Chart reviewed and updated, discussed with Mr. Reino Bellis. Ms. Cloer is a 82 year old patient of Dr. Percival Spanish and [facility] with chronic atrial fibrillation, CHADSVASC score 5 on Coumadin as well as aortic stenosis that has progressed to severe range by recent follow-up echocardiogram. She was admitted to the hospital with heart failure symptoms, diastolic in the setting of valvular heart disease. She improved with diuresis. Recently, she was taken off of metoprolol 12.5 mg twice daily by PCP due to bradycardia with heart rate in the 40s. She has continued with similar heart rate in the hospital although has also had some brief episodes of an irregular wide complex tachycardia, most likely aberrantly conducted atrial fibrillation, cannot exclude some NSVT events. She has had no clear symptoms during these episodes and there is no known history of syncope or near-syncope.  On examination this morning she is comfortable, hard of hearing, does not provide any detailed history. Denies chest pain or dizziness. Heart rate is in the 40s to 60s in slow atrial fibrillation with some junctional rhythm noted as well. Systolic blood pressure 123456 range. Lungs exhibit clear breath sounds, cardiac exam with irregular rhythm and [id number] systolic murmur at the base consistent with aortic stenosis. No peripheral edema noted. Lab work shows creatinine 1.8, troponin I levels nondiagnostic at 0.03 in flat pattern, hemoglobin 11.6, platelets 217. ECG shows atrial fibrillation with  aberrantly conducted complex, diffuse nonspecific ST changes and leftward axis.  Sick sinus syndrome with slow atrial fibrillation at baseline. Agree with recent discontinuation of metoprolol. She has had some brief limited episodes of increased heart rate, nothing sustained, and not clearly symptomatic. Possibility of ultimately considering a pacemaker was discussed with the patient, also by phone with her son per Mr. Reino Bellis. There is no clear indication to proceed with pacemaker now however. Would suggest continuing observation for now, addition of low-dose pindolol as a  next option if she has more frequent episodes of increased heart rate could be considered. As far as the progressive aortic stenosis is concerned, would favor conservative management, and not pursue further invasive testing including TAVR. We will arrange an office visit with Dr. Percival Spanish in the [place] office for follow-up.   Satira Sark, M.D., F.A.C.C.

## 2015-08-11 NOTE — Progress Notes (Signed)
Patient states understanding of discharge instructions.  

## 2015-08-11 NOTE — Discharge Summary (Signed)
Physician Discharge Summary  Candace [name] [id number] DOB: 1922/10/05 DOA: 08/08/2015  PCP: Chevis Pretty, FNP  Admit date: 08/08/2015 Discharge date: 08/11/2015  Recommendations for Outpatient Follow-up:  1. Follow up heart failure and sick sinus syndrome  Follow-up Information    [name] Done, FNP On 08/24/2015.   Specialty:  Family Medicine Why:  at 2:30 pm Contact information: [place] [address] [phone]        [name], PA-C On 08/19/2015.   Specialties:  Cardiology, Radiology Why:  at 1:30 pm in [facility] office Contact information: [address] [place] [address] [phone]          Discharge Diagnoses:  1. Acute valvular CHF 2. Severe calcific aortic stenosis 3. Moderate to severe tricuspid regurgitation 4. Sick sinus syndrome 5. Atrial fibrillation  6. CKD stage III 7. HTN  Discharge Condition: improved Disposition: discharge home  Diet recommendation: heart healthy  Filed Weights   08/09/15 0431 08/10/15 0629 08/11/15 0441  Weight: 43.4 kg (95 lb 9.6 oz) 43.4 kg (95 lb 9.6 oz) 44.4 kg (97 lb 12.8 oz)    History of present illness:  82 year old woman presented with shortness of breath, initial evaluation suggested acute heart failure.  Hospital Course:  Patient was admitted, responded well to IV diuresis. Echocardiogram demonstrated significant valvular abnormalities including severe aortic stenosis and moderate to severe tricuspid regurgitation. Telemetry revealed predominantly bradycardia with intermittent bursts of tachycardia prompting cardiology consultation for sick sinus syndrome. Patient was asymptomatic with both bradycardia and tachycardia and therefore recommendation was made for conservative management with outpatient follow-up.  1. Acute valvular CHF (aortic stenosis, tricuspid regurgitation). Cannot exclude diastolic dysfunction secondary to atrial fibrillation. Appears  euvolemic at this point. Not currently a candidate for beta blocker secondary to bradycardia. 2. Severe calcific aortic stenosis. Asymptomatic at this point. 3. Moderate to severe tricuspid regurgitation 4. Sick sinus syndrome with afib. Conservative management per cardiology 5. Atrial fibrillation with SSS. Heart rate stable. Chronically anticoagulated with Coumadin. CHA2DS2-VASc score 5. INR  therapeutic. 6. CKD stage 3. Creatinine elevated in the setting of diuresis.  7. HTN. Continue antihypertensives  Consultants:  none  Procedures:  ECHO Study Conclusions  - Left ventricle: The cavity size was normal. Wall thickness was increased in a pattern of mild LVH. Systolic function was vigorous. The estimated ejection fraction was in the range of 65% to 70%. Wall motion was normal; there were no regional wall motion abnormalities. The study was not technically sufficient to allow evaluation of LV diastolic dysfunction due to atrial fibrillation. - Aortic valve: There was severe stenosis. Mean gradient (S): 26 mm Hg. Peak gradient (S): 53 mm Hg. VTI ratio of LVOT to aortic valve: 0.23. Valve area (VTI): 0.52 cm^2. Valve area (Vmax): 0.46 cm^2. - Mitral valve: Calcified annulus. There was mild regurgitation. - Left atrium: The atrium was severely dilated. - Right ventricle: The cavity size was mildly dilated. - Right atrium: The atrium was severely dilated. Central venous pressure (est): 3 mm Hg. - Tricuspid valve: There was moderate-severe regurgitation. - Pulmonic valve: There was mild regurgitation. - Pulmonary arteries: Systolic pressure was severely increased. PA peak pressure: 62 mm Hg (S). - Pericardium, extracardiac: Small to moderate circumferential pericardial effusion, largest collection posteriorly and at the base of the heart.  Antimicrobials:  none  Discharge Instructions  Discharge Instructions    Diet - low sodium heart healthy     Complete by:  As directed   Discharge instructions  Complete by:  As directed   Call your physician or seek immediate medical attention for passing out, dizziness, generalized weakness, shortness of breath, chest pain or worsening of condition.   Increase activity slowly    Complete by:  As directed       Medication List    STOP taking these medications   amLODipine 2.5 MG tablet Commonly known as:  NORVASC   lisinopril 40 MG tablet Commonly known as:  PRINIVIL,ZESTRIL     TAKE these medications   acetaminophen 500 MG tablet Commonly known as:  TYLENOL Take 500 mg by mouth as needed for pain.   bimatoprost 0.01 % Soln Commonly known as:  LUMIGAN Place 1 drop into both eyes at bedtime.   brimonidine 0.1 % Soln Commonly known as:  ALPHAGAN P Apply 1 drop to eye 2 (two) times daily.   denosumab 60 MG/ML Soln injection Commonly known as:  PROLIA INJECT 60 MG INTO THE SKIN EVERY 6 MONTHS. BRING TO OFFICE FOR ADMINSTRATION   furosemide 20 MG tablet Commonly known as:  LASIX Take one tablet daily   simvastatin 40 MG tablet Commonly known as:  ZOCOR Take 1 tablet (40 mg total) by mouth at bedtime.   warfarin 2 MG tablet Commonly known as:  COUMADIN TAKE ONE OR TWO TABLETS BY MOUTH DAILY AS DIRECTED AS INSTRUCTED BY ANTICOAGULATION What changed:  See the new instructions.      Allergies  Allergen Reactions  . Codeine Hives and Shortness Of Breath  . Fosamax [Alendronate Sodium] Hives and Nausea And Vomiting  . Penicillins Hives  . Sulfa Antibiotics Itching, Nausea And Vomiting and Rash  . Tramadol Other (See Comments)    Near syncope  . Boniva [Ibandronic Acid]   . Septra [Sulfamethoxazole-Trimethoprim]   . Vioxx [Rofecoxib]     The results of significant diagnostics from this hospitalization (including imaging, microbiology, ancillary and laboratory) are listed below for reference.    Significant Diagnostic Studies: Dg Chest 2 View  Result Date:  08/08/2015 CLINICAL DATA:  PT c/o SOB on exertion for several weeks and her PCP d/ced her metoprolol on this past Friday pain left neck and shoulder EXAM: CHEST  2 VIEW COMPARISON:  09/27/2012 FINDINGS: Heart is enlarged. There are bilateral pleural effusions. No pulmonary edema. No consolidations. Previous multilevel vertebroplasty at T9, T10, and T11. Numerous additional wedge compression fractures appear stable and involve T6, T7, T8, and T12. IMPRESSION: 1. Cardiomegaly. 2. Bilateral pleural effusions. 3. Multiple chronic vertebral fractures. Electronically Signed   By: Nolon Nations M.D.   On: 08/08/2015 16:19   Dg Clavicle Left  Result Date: 08/08/2015 CLINICAL DATA:  Tenderness about the left clavicle for 2-3 weeks. No recent injury. EXAM: LEFT CLAVICLE - 2+ VIEWS COMPARISON:  None. FINDINGS: No acute bony or joint abnormality is seen. Remote healed mid clavicle fracture is noted. Bones are somewhat osteopenic. Imaged lung parenchyma is clear. Aortic atherosclerosis is noted. IMPRESSION: No acute abnormality. Remote healed left clavicle fracture. Electronically Signed   By: Inge Rise M.D.   On: 08/08/2015 17:51   Labs: Basic Metabolic Panel:  Recent Labs Lab 08/08/15 1548 08/09/15 0541 08/10/15 0552 08/11/15 0518  NA 139 140 138 133*  K 3.7 3.7 4.0 3.9  CL 108 105 106 101  CO2 25 29 26 27   GLUCOSE 89 99 87 111*  BUN 14 14 16 20   CREATININE 1.52* 1.50* 1.62* 1.80*  CALCIUM 8.6* 8.3* 7.9* 8.1*  MG  --   --  2.0  --    Liver Function Tests:  Recent Labs Lab 08/08/15 1548 08/09/15 0541  AST 18 18  ALT 19 17  ALKPHOS 50 46  BILITOT 0.7 0.7  PROT 6.8 6.4*  ALBUMIN 3.7 3.4*   CBC:  Recent Labs Lab 08/08/15 1548 08/09/15 0541 08/10/15 0552  WBC 4.7 5.6 4.8  NEUTROABS 3.5 3.9  --   HGB 12.4 12.9 11.6*  HCT 38.5 39.9 36.0  MCV 90.2 90.7 90.5  PLT 239 266 217   Cardiac Enzymes:  Recent Labs Lab 08/08/15 1548 08/09/15 0058 08/09/15 0542 08/09/15 1221   CKTOTAL  --  51  --   --   CKMB  --  2.7  --   --   TROPONINI 0.03* 0.03* 0.03* 0.03*    Recent Labs  08/08/15 1548  BNP 940.0*    ProBNP (last 3 results) No results for input(s): PROBNP in the last 8760 hours.  CBG: No results for input(s): GLUCAP in the last 168 hours.  Principal Problem:   Acute congestive heart failure (HCC) Active Problems:   Atrial fibrillation (HCC)   HTN (hypertension)   Severe aortic stenosis   Dyspnea   Elevated troponin   CKD (chronic kidney disease) stage 3, GFR 30-59 ml/min   Sick sinus syndrome (HCC)   Non-sustained ventricular tachycardia ([place])   Anticoagulated on Coumadin   Time coordinating discharge: 35 miniutes  Signed:  Murray Hodgkins, MD [facility] 08/11/2015, 5:53 PM   By signing my name below, I, Delene Ruffini, attest that this documentation has been prepared under the direction and in the presence of Daniel P. Sarajane Jews, MD. Electronically Signed: Delene Ruffini, Scribe.  08/11/15 9:30am  I personally performed the services described in this documentation. All medical record entries made by the scribe were at my direction. I have reviewed the chart and agree that the record reflects my personal performance and is accurate and complete. Murray Hodgkins, MD

## 2015-08-11 NOTE — Progress Notes (Signed)
PROGRESS NOTE  Candace [name] [id number] DOB: 11-20-1922 DOA: 08/08/2015 PCP: Chevis Pretty, FNP  Brief Narrative: 82 year old woman presented with complaints of dyspnea for one and a half weeks. While in the ED, evaluation revealed bilateral effusions and a mildly elevated troponin.   Assessment/Plan: 1. Acute valvular CHF (aortic stenosis, tricuspid regurgitation). Cannot exclude diastolic dysfunction secondary to atrial fibrillation. Euvolemic at this point. Not a candidate for beta blocker secondary to bradycardia. 2. Severe calcific aortic stenosis. Asymptomatic at this point. Conservative management per cardiology. 3. Moderate to severe tricuspid regurgitation, appears asymptomatic at this point. 4. Sick sinus syndrome with afib. Conservative management per cardiology. Plan for follow-up with her outpatient cardiologist. No medications for now. 5. Atrial fibrillation with SSS. Heart rate stable. Chronically anticoagulated with Coumadin. CHA2DS2-VAScscore 5. INR therapeutic. 6. CKD stage III. Creatinine elevated in the setting of diuresis. Expect spontaneous return to baseline. 7. HTN. Continue antihypertensives   Doing well, no complaints. Discharge home. Outpatient follow up with cardiology.    Murray Hodgkins, MD  [facility] Direct contact: [phone] --Via amion app OR  --[email or web address]; password [email or web address]  7PM-7AM contact night coverage as above 08/11/2015, 7:33 AM  LOS: 0 days   Consultants:  none  Procedures:  ECHO Study Conclusions  - Left ventricle: The cavity size was normal. Wall thickness was   increased in a pattern of mild LVH. Systolic function was   vigorous. The estimated ejection fraction was in the range of 65%   to 70%. Wall motion was normal; there were no regional wall   motion abnormalities. The study was not technically sufficient to   allow evaluation of LV diastolic dysfunction due to atrial   fibrillation. - Aortic valve:  There was severe stenosis. Mean gradient (S): 26 mm   Hg. Peak gradient (S): 53 mm Hg. VTI ratio of LVOT to aortic   valve: 0.23. Valve area (VTI): 0.52 cm^2. Valve area (Vmax): 0.46   cm^2. - Mitral valve: Calcified annulus. There was mild regurgitation. - Left atrium: The atrium was severely dilated. - Right ventricle: The cavity size was mildly dilated. - Right atrium: The atrium was severely dilated. Central venous   pressure (est): 3 mm Hg. - Tricuspid valve: There was moderate-severe regurgitation. - Pulmonic valve: There was mild regurgitation. - Pulmonary arteries: Systolic pressure was severely increased. PA   peak pressure: 62 mm Hg (S). - Pericardium, extracardiac: Small to moderate circumferential   pericardial effusion, largest collection posteriorly and at the   base of the heart.  Antimicrobials:  none  HPI/Subjective: Feels well. No complaints of pain. No other complaints.   Objective: Vitals:   08/10/15 1001 08/10/15 1419 08/10/15 2142 08/11/15 0441  BP: (!) 123/52 (!) 110/50 (!) 106/49 107/60  Pulse:  (!) 49 (!) 47 62  Resp:  20 16 16   Temp:  97.8 F (36.6 C) 98.2 F (36.8 C) 97.6 F (36.4 C)  TempSrc:  Oral Oral Oral  SpO2:  97% 98% 98%  Weight:    44.4 kg (97 lb 12.8 oz)  Height:        Intake/Output Summary (Last 24 hours) at 08/11/15 0733 Last data filed at 08/11/15 0442  Gross per 24 hour  Intake              120 ml  Output              500 ml  Net             -380  ml     Filed Weights   08/09/15 0431 08/10/15 0629 08/11/15 0441  Weight: 43.4 kg (95 lb 9.6 oz) 43.4 kg (95 lb 9.6 oz) 44.4 kg (97 lb 12.8 oz)    Exam:    Constitutional:  . Appears calm and comfortable Respiratory:  . Basilar/ bibasilar crackles. No wheezes or rhonchi. Marland Kitchen Respiratory effort normal. No retractions or accessory muscle use Cardiovascular:  . Bradycardic. 3/6 hollow systolic murmer . No LE extremity edema   . Telemetry marked bradycardia with brief narrow  complex tachycardia Psychiatric:  . Mental status o Mood, affect appropriate   I have personally reviewed following labs and imaging studies:  Cr 1.80, BUN 20 with diuresis  Sodium 133  INR 2.77 therapeutic  Scheduled Meds: . amLODipine  2.5 mg Oral Daily  . brimonidine  1 drop Both Eyes BID  . furosemide  20 mg Intravenous BID  . gabapentin  100 mg Oral TID  . latanoprost  1 drop Both Eyes QHS  . lisinopril  40 mg Oral Daily  . simvastatin  40 mg Oral QHS  . sodium chloride flush  3 mL Intravenous Q12H  . sodium chloride flush  3 mL Intravenous Q12H  . Warfarin - Pharmacist Dosing Inpatient   Does not apply Q24H   Continuous Infusions:   Principal Problem:   Acute congestive heart failure (HCC) Active Problems:   Atrial fibrillation (HCC)   HTN (hypertension)   Aortic stenosis   Dyspnea   Elevated troponin   Renal insufficiency   CKD (chronic kidney disease) stage 3, GFR 30-59 ml/min   LOS: 0 days    By signing my name below, I, Delene Ruffini, attest that this documentation has been prepared under the direction and in the presence of Daniel P. Sarajane Jews, MD. Electronically Signed: Delene Ruffini, Scribe.  08/11/15 9:20am  I personally performed the services described in this documentation. All medical record entries made by the scribe were at my direction. I have reviewed the chart and agree that the record reflects my personal performance and is accurate and complete. Murray Hodgkins, MD

## 2015-08-11 NOTE — Progress Notes (Addendum)
Dr. Sarajane Jews notified of patient's VS and gave order to d/c the Lisinopril and Norvasc and to administer the IV Lasix.  Patient's IV removed prior to discharge.  Site WNL. Patient transported by NT via wheelchair to main entrance for discharge.  Patient stable at time of discharge.

## 2015-08-12 ENCOUNTER — Encounter (HOSPITAL_COMMUNITY): Payer: Self-pay

## 2015-08-12 ENCOUNTER — Encounter: Payer: Self-pay | Admitting: Vascular Surgery

## 2015-08-13 ENCOUNTER — Encounter (HOSPITAL_COMMUNITY): Payer: Self-pay | Admitting: *Deleted

## 2015-08-13 ENCOUNTER — Emergency Department (HOSPITAL_COMMUNITY): Payer: Medicare Other

## 2015-08-13 ENCOUNTER — Observation Stay (HOSPITAL_COMMUNITY)
Admission: EM | Admit: 2015-08-13 | Discharge: 2015-08-14 | Disposition: A | Discharge status: Home / Self Care | Payer: Medicare Other | Attending: Family Medicine | Admitting: Family Medicine

## 2015-08-13 DIAGNOSIS — I130 Hypertensive heart and chronic kidney disease with heart failure and stage 1 through stage 4 chronic kidney disease, or unspecified chronic kidney disease: Secondary | ICD-10-CM | POA: Diagnosis not present

## 2015-08-13 DIAGNOSIS — I509 Heart failure, unspecified: Secondary | ICD-10-CM | POA: Diagnosis not present

## 2015-08-13 DIAGNOSIS — Z7901 Long term (current) use of anticoagulants: Secondary | ICD-10-CM | POA: Diagnosis not present

## 2015-08-13 DIAGNOSIS — I482 Chronic atrial fibrillation, unspecified: Secondary | ICD-10-CM

## 2015-08-13 DIAGNOSIS — I350 Nonrheumatic aortic (valve) stenosis: Secondary | ICD-10-CM | POA: Insufficient documentation

## 2015-08-13 DIAGNOSIS — I4891 Unspecified atrial fibrillation: Secondary | ICD-10-CM | POA: Diagnosis present

## 2015-08-13 DIAGNOSIS — I110 Hypertensive heart disease with heart failure: Secondary | ICD-10-CM | POA: Diagnosis not present

## 2015-08-13 DIAGNOSIS — N183 Chronic kidney disease, stage 3 unspecified: Secondary | ICD-10-CM | POA: Diagnosis present

## 2015-08-13 DIAGNOSIS — R0600 Dyspnea, unspecified: Secondary | ICD-10-CM | POA: Diagnosis present

## 2015-08-13 DIAGNOSIS — J90 Pleural effusion, not elsewhere classified: Secondary | ICD-10-CM | POA: Diagnosis present

## 2015-08-13 DIAGNOSIS — I495 Sick sinus syndrome: Secondary | ICD-10-CM | POA: Diagnosis present

## 2015-08-13 DIAGNOSIS — Z79899 Other long term (current) drug therapy: Secondary | ICD-10-CM | POA: Insufficient documentation

## 2015-08-13 DIAGNOSIS — R0602 Shortness of breath: Secondary | ICD-10-CM | POA: Diagnosis not present

## 2015-08-13 LAB — HEPATIC FUNCTION PANEL
ALT: 13 U/L — ABNORMAL LOW (ref 14–54)
AST: 20 U/L (ref 15–41)
Albumin: 3.4 g/dL — ABNORMAL LOW (ref 3.5–5.0)
Alkaline Phosphatase: 44 U/L (ref 38–126)
BILIRUBIN INDIRECT: 0.3 mg/dL (ref 0.3–0.9)
Bilirubin, Direct: 0.2 mg/dL (ref 0.1–0.5)
TOTAL PROTEIN: 6 g/dL — AB (ref 6.5–8.1)
Total Bilirubin: 0.5 mg/dL (ref 0.3–1.2)

## 2015-08-13 LAB — PROTIME-INR
INR: 1.98
Prothrombin Time: 22.8 seconds — ABNORMAL HIGH (ref 11.4–15.2)

## 2015-08-13 LAB — CBC WITH DIFFERENTIAL/PLATELET
BASOS ABS: 0 10*3/uL (ref 0.0–0.1)
BASOS PCT: 0 %
EOS ABS: 0.1 10*3/uL (ref 0.0–0.7)
Eosinophils Relative: 1 %
HCT: 36.3 % (ref 36.0–46.0)
Hemoglobin: 11.8 g/dL — ABNORMAL LOW (ref 12.0–15.0)
Lymphocytes Relative: 23 %
Lymphs Abs: 1.5 10*3/uL (ref 0.7–4.0)
MCH: 29.2 pg (ref 26.0–34.0)
MCHC: 32.5 g/dL (ref 30.0–36.0)
MCV: 89.9 fL (ref 78.0–100.0)
MONO ABS: 0.8 10*3/uL (ref 0.1–1.0)
MONOS PCT: 12 %
NEUTROS PCT: 64 %
Neutro Abs: 4.2 10*3/uL (ref 1.7–7.7)
Platelets: 229 10*3/uL (ref 150–400)
RBC: 4.04 MIL/uL (ref 3.87–5.11)
RDW: 15.3 % (ref 11.5–15.5)
WBC: 6.6 10*3/uL (ref 4.0–10.5)

## 2015-08-13 LAB — BASIC METABOLIC PANEL
Anion gap: 6 (ref 5–15)
BUN: 30 mg/dL — ABNORMAL HIGH (ref 6–20)
CO2: 27 mmol/L (ref 22–32)
Calcium: 9.3 mg/dL (ref 8.9–10.3)
Chloride: 100 mmol/L — ABNORMAL LOW (ref 101–111)
Creatinine, Ser: 1.84 mg/dL — ABNORMAL HIGH (ref 0.44–1.00)
GFR calc non Af Amer: 23 mL/min — ABNORMAL LOW (ref >60)
GFR, EST AFRICAN AMERICAN: 27 mL/min — AB (ref >60)
Glucose, Bld: 139 mg/dL — ABNORMAL HIGH (ref 65–99)
Potassium: 4.8 mmol/L (ref 3.5–5.1)
SODIUM: 133 mmol/L — AB (ref 135–145)

## 2015-08-13 LAB — BRAIN NATRIURETIC PEPTIDE: B NATRIURETIC PEPTIDE 5: 1501 pg/mL — AB (ref 0.0–100.0)

## 2015-08-13 LAB — TROPONIN I: TROPONIN I: 0.04 ng/mL — AB (ref <0.03)

## 2015-08-13 MED ORDER — FUROSEMIDE 10 MG/ML IJ SOLN
INTRAMUSCULAR | Status: AC
  Filled 2015-08-13: qty 2

## 2015-08-13 MED ORDER — FUROSEMIDE 10 MG/ML IJ SOLN
20.0000 mg | Freq: Once | INTRAMUSCULAR | Status: AC
  Administered 2015-08-13: 20 mg via INTRAVENOUS

## 2015-08-13 NOTE — ED Triage Notes (Signed)
Pt states that she was discharged from hospital two days ago, started to feel sob and feel like she is having "fluid" again, also c/o pain to left shoulder area,

## 2015-08-13 NOTE — ED Notes (Signed)
Candace [name], Lab called critical lab Troponin 0.04- Dr Viviana Simpler informed

## 2015-08-13 NOTE — ED Provider Notes (Signed)
[facility] DEPT Provider Note   CSN: [id number] Arrival date & time: 08/13/15  2156  First Provider Contact:   First MD Initiated Contact with Patient 08/13/15 2211      By signing my name below, I, Higinio Plan, attest that this documentation has been prepared under the direction and in the presence of Forde Dandy, MD . Electronically Signed: Higinio Plan, Scribe. 08/13/2015. 10:23 PM.  History   Chief Complaint Chief Complaint  Patient presents with  . Shortness of Breath   The history is provided by the patient. No language interpreter was used.   HPI Comments: Candace [name] is a 82 y.o. female with PMHx of A-FIb with SSS on coumadin, valvular CHF w/ severe AS, DVT, HTN, HLD and PE, who presents to the Emergency Department complaining of gradually worsening, shortness of breath that began 1 week ago and worsened this evening. She notes her pain is exacerbated when walking and moving around. She states she feels as if she has "fluid buildup" in her abdomen and bilateral feet.THis is all new since discharge. She also states associated cough and 1 episode of lightheadedness this evening while washing her hands. She denies loss of consciousness, any changes in her medication, chest pain, changes in her appetite, fever and vomiting. Pt reports she was discharged from the hospital 2 days ago for similar symptoms; she notes she felt better when when leaving the hospital.   Past Medical History:  Diagnosis Date  . Aortic stenosis   . Atrial fibrillation ([place])   . DVT (deep venous thrombosis) ([place])   . Essential hypertension   . Glaucoma   . History of skin cancer 2016  . Hypercholesterolemia   . PE (pulmonary embolism)     Patient Active Problem List   Diagnosis Date Noted  . Sick sinus syndrome ([place]) 08/11/2015  . Non-sustained ventricular tachycardia ([place]) 08/11/2015  . Anticoagulated on Coumadin 08/11/2015  . Acute congestive heart failure ([place]) 08/09/2015  . CKD (chronic  kidney disease) stage 3, GFR 30-59 ml/min 08/09/2015  . Dyspnea 08/08/2015  . Elevated troponin 08/08/2015  . Renal insufficiency 08/08/2015  . Bilateral pleural effusion   . Severe aortic stenosis 08/05/2015  . Hyperlipidemia 06/10/2015  . Tobacco use disorder 01/28/2014  . Osteoporosis with pathological fracture 08/13/2012  . DVT (deep venous thrombosis), right 05/13/2012  . HTN (hypertension) 05/13/2012  . Long term current use of anticoagulant therapy 05/13/2012  . Atrial fibrillation ([place]) 04/15/2012    Past Surgical History:  Procedure Laterality Date  . CATARACT EXTRACTION    . CHOLECYSTECTOMY    . KYPHOPLASTY    . SKIN CANCER EXCISION Left 2016   neck    OB History    Gravida Para Term Preterm AB Living             4   SAB TAB Ectopic Multiple Live Births                   Home Medications    Prior to Admission medications   Medication Sig Start Date End Date Taking? Authorizing Provider  bimatoprost (LUMIGAN) 0.01 % SOLN Place 1 drop into both eyes at bedtime. 03/29/15  Yes [name] Done, FNP  brimonidine (ALPHAGAN P) 0.1 % SOLN Apply 1 drop to eye 2 (two) times daily.    Yes Historical Provider, MD  denosumab (PROLIA) 60 MG/ML SOLN injection INJECT 60 MG INTO THE SKIN EVERY 6 MONTHS. BRING TO OFFICE FOR ADMINSTRATION 02/24/15  Yes Tammy Eckard,  PharmD  furosemide (LASIX) 20 MG tablet Take one tablet daily Patient taking differently: Take 20 mg by mouth daily. Take one tablet daily 07/28/15  Yes [name] Done, FNP  simvastatin (ZOCOR) 40 MG tablet Take 1 tablet (40 mg total) by mouth at bedtime. 06/10/15  Yes [name] Done, FNP  warfarin (COUMADIN) 2 MG tablet TAKE ONE OR TWO TABLETS BY MOUTH DAILY AS DIRECTED AS INSTRUCTED BY ANTICOAGULATION Patient taking differently: TAKE ONE TABLET BY MOUTH ONCE DAILY IN THE EVENING 06/08/15  Yes Fransisca Kaufmann Dettinger, MD  acetaminophen (TYLENOL) 500 MG tablet Take 500 mg by mouth as needed for pain.    Historical  Provider, MD    Family History Family History  Problem Relation Age of Onset  . Stroke Father   . Heart attack Mother   . Diabetes Brother   . Heart disease Brother   . Early death Sister     Social History Social History  Substance Use Topics  . Smoking status: Never Smoker  . Smokeless tobacco: Current User    Types: Snuff  . Alcohol use No     Allergies   Codeine; Fosamax [alendronate sodium]; Penicillins; Sulfa antibiotics; Tramadol; Boniva [ibandronic acid]; Septra [sulfamethoxazole-trimethoprim]; and Vioxx [rofecoxib]   Review of Systems Review of Systems 10/14 systems reviewed and all are negative for acute change except as noted in the HPI.  Physical Exam Updated Vital Signs BP 137/61   Pulse (!) 43   Temp 97.7 F (36.5 C) (Oral)   Resp 16   Wt 97 lb (44 kg)   SpO2 96%   BMI 18.33 kg/m   Physical Exam Physical Exam  Nursing note and vitals reviewed. Constitutional: elderly woman, non-toxic, and in no acute distress Head: Normocephalic and atraumatic.  Mouth/Throat: Oropharynx is clear and dry.  Neck: Normal range of motion. Neck supple.  Cardiovascular: bradycardic rate and regular rhythm.  trace pedal edema bilaterally Pulmonary/Chest: Effort normal and breath sounds normal.  Abdominal: Soft. Mild distensionThere is no tenderness. There is no rebound and no guarding.  Musculoskeletal: Normal range of motion.  Neurological: Alert, no facial droop, fluent speech, moves all extremities symmetrically Skin: Skin is warm and dry.  Psychiatric: Cooperative  ED Treatments / Results  Labs (all labs ordered are listed, but only abnormal results are displayed) Labs Reviewed  CBC WITH DIFFERENTIAL/PLATELET - Abnormal; Notable for the following:       Result Value   Hemoglobin 11.8 (*)    All other components within normal limits  BASIC METABOLIC PANEL - Abnormal; Notable for the following:    Sodium 133 (*)    Chloride 100 (*)    Glucose, Bld 139 (*)     BUN 30 (*)    Creatinine, Ser 1.84 (*)    GFR calc non Af Amer 23 (*)    GFR calc Af Amer 27 (*)    All other components within normal limits  TROPONIN I - Abnormal; Notable for the following:    Troponin I 0.04 (*)    All other components within normal limits  BRAIN NATRIURETIC PEPTIDE - Abnormal; Notable for the following:    B Natriuretic Peptide 1,501.0 (*)    All other components within normal limits  PROTIME-INR - Abnormal; Notable for the following:    Prothrombin Time 22.8 (*)    All other components within normal limits  HEPATIC FUNCTION PANEL - Abnormal; Notable for the following:    Total Protein 6.0 (*)    Albumin 3.4 (*)  ALT 13 (*)    All other components within normal limits    EKG  EKG Interpretation  Date/Time:  Saturday August 13 2015 22:08:33 EDT Ventricular Rate:  47 PR Interval:    QRS Duration: 125 QT Interval:  477 QTC Calculation: 422 R Axis:   108 Text Interpretation:  Sinus bradycardia RBBB and LPFB ST depr, consider ischemia, inferior leads Sinus bradycardia versus atrial flutter Confirmed by LIU MD, Hinton Dyer [phone]) on 08/13/2015 10:10:50 PM       Radiology Dg Chest 2 View  Result Date: 08/13/2015 CLINICAL DATA:  82 year old female with shortness breath for 1 week, worse today. Pain in the left side of the neck. EXAM: CHEST  2 VIEW COMPARISON:  Chest x-ray 08/08/2015. FINDINGS: Lung volumes are low. No consolidative airspace disease. Trace bilateral pleural effusions. No evidence of pulmonary edema. Heart size is mildly enlarged. Upper mediastinal contours are distorted by patient's rotation to the left. Multiple thoracic vertebral body compression fractures at T6, T7, T9, T10, T11 and T12, most severe at T9 where there is approximately 70% loss of anterior vertebral body height. Post vertebroplasty changes are again noted at T9, T10 and T11. Kyphosis of the thoracic spine. IMPRESSION: 1. Trace bilateral pleural effusions. 2. Mild cardiomegaly. 3.  Aortic atherosclerosis. 4. Multiple vertebral body compression fractures redemonstrated. Electronically Signed   By: Vinnie Langton M.D.   On: 08/13/2015 23:15    Procedures Procedures  DIAGNOSTIC STUDIES:  Oxygen Saturation is 96% on RA, normal by my interpretation.    COORDINATION OF CARE:  10:21 PM Discussed treatment plan, which includes CXR with pt at bedside and pt agreed to plan.   Medications Ordered in ED Medications  furosemide (LASIX) 10 MG/ML injection (not administered)  furosemide (LASIX) injection 20 mg (20 mg Intravenous Given 08/13/15 2334)     Initial Impression / Assessment and Plan / ED Course  I have reviewed the triage vital signs and the nursing notes.  Pertinent labs & imaging results that were available during my care of the patient were reviewed by me and considered in my medical decision making (see chart for details).  Clinical Course    82 year old female with valvular CHF with severe aortic stenosis, atrial fibrillation with sick sinus syndrome on Coumadin who presents with increasing edema, distention, dyspnea on exertion. Is nontoxic and in no acute distress. Has chronic bradycardia with atrial fibrillation on EKG. Is normotensive and on room air with normal oxygenation. Looks mildly fluid overloaded on exam. BNP significantly elevated at 1500, and she was admitted with BNP of 900s in late July. She has a mild troponin bump of 0.04, but no chest pain. Suspect that this is worsening CHF. Has had increasing creatinine to 1.84 compared to when she was first hospitalized. Chest x-ray with cardiomegaly and trace bilateral pleural effusions. She did receive IV Lasix, and she is admitted to the hospitalist service for ongoing diuresis.  I personally performed the services described in this documentation, which was scribed in my presence. The recorded information has been reviewed and is accurate.   Final Clinical Impressions(s) / ED Diagnoses   Final  diagnoses:  Acute on chronic congestive heart failure, unspecified congestive heart failure type (HCC)  Severe aortic stenosis  Chronic atrial fibrillation [facility])    New Prescriptions New Prescriptions   No medications on file     Forde Dandy, MD 08/14/15 0004

## 2015-08-14 ENCOUNTER — Encounter (HOSPITAL_COMMUNITY): Payer: Self-pay | Admitting: *Deleted

## 2015-08-14 DIAGNOSIS — I509 Heart failure, unspecified: Secondary | ICD-10-CM

## 2015-08-14 DIAGNOSIS — I4891 Unspecified atrial fibrillation: Secondary | ICD-10-CM

## 2015-08-14 DIAGNOSIS — N183 Chronic kidney disease, stage 3 (moderate): Secondary | ICD-10-CM | POA: Diagnosis not present

## 2015-08-14 DIAGNOSIS — I82401 Acute embolism and thrombosis of unspecified deep veins of right lower extremity: Secondary | ICD-10-CM | POA: Diagnosis not present

## 2015-08-14 DIAGNOSIS — I495 Sick sinus syndrome: Secondary | ICD-10-CM

## 2015-08-14 DIAGNOSIS — I350 Nonrheumatic aortic (valve) stenosis: Secondary | ICD-10-CM

## 2015-08-14 LAB — BASIC METABOLIC PANEL
ANION GAP: 7 (ref 5–15)
BUN: 29 mg/dL — ABNORMAL HIGH (ref 6–20)
CALCIUM: 9.1 mg/dL (ref 8.9–10.3)
CO2: 28 mmol/L (ref 22–32)
CREATININE: 1.84 mg/dL — AB (ref 0.44–1.00)
Chloride: 100 mmol/L — ABNORMAL LOW (ref 101–111)
GFR, EST AFRICAN AMERICAN: 27 mL/min — AB (ref >60)
GFR, EST NON AFRICAN AMERICAN: 23 mL/min — AB (ref >60)
Glucose, Bld: 101 mg/dL — ABNORMAL HIGH (ref 65–99)
Potassium: 5.1 mmol/L (ref 3.5–5.1)
SODIUM: 135 mmol/L (ref 135–145)

## 2015-08-14 LAB — MRSA PCR SCREENING: MRSA by PCR: NEGATIVE

## 2015-08-14 LAB — CBC
HEMATOCRIT: 38.2 % (ref 36.0–46.0)
Hemoglobin: 12.5 g/dL (ref 12.0–15.0)
MCH: 29.2 pg (ref 26.0–34.0)
MCHC: 32.7 g/dL (ref 30.0–36.0)
MCV: 89.3 fL (ref 78.0–100.0)
Platelets: 244 10*3/uL (ref 150–400)
RBC: 4.28 MIL/uL (ref 3.87–5.11)
RDW: 15.3 % (ref 11.5–15.5)
WBC: 6.6 10*3/uL (ref 4.0–10.5)

## 2015-08-14 LAB — PROTIME-INR
INR: 1.89
PROTHROMBIN TIME: 22 s — AB (ref 11.4–15.2)

## 2015-08-14 MED ORDER — ACETAMINOPHEN 325 MG PO TABS: 650.0000 mg | ORAL_TABLET | ORAL | Status: DC | PRN

## 2015-08-14 MED ORDER — ONDANSETRON HCL 4 MG/2ML IJ SOLN: 4.0000 mg | Freq: Four times a day (QID) | INTRAMUSCULAR | Status: DC | PRN

## 2015-08-14 MED ORDER — LATANOPROST 0.005 % OP SOLN
1.0000 [drp] | Freq: Every day | OPHTHALMIC | Status: DC
  Filled 2015-08-14: qty 2.5

## 2015-08-14 MED ORDER — ACETAMINOPHEN 500 MG PO TABS: 500.0000 mg | ORAL_TABLET | Freq: Three times a day (TID) | ORAL | Status: AC | PRN

## 2015-08-14 MED ORDER — WARFARIN - PHARMACIST DOSING INPATIENT: Status: DC

## 2015-08-14 MED ORDER — SODIUM CHLORIDE 0.9% FLUSH: 3.0000 mL | INTRAVENOUS | Status: DC | PRN

## 2015-08-14 MED ORDER — SIMVASTATIN 20 MG PO TABS: 40.0000 mg | ORAL_TABLET | Freq: Every day | ORAL | Status: DC

## 2015-08-14 MED ORDER — FUROSEMIDE 10 MG/ML IJ SOLN: 40.0000 mg | Freq: Every day | INTRAMUSCULAR | Status: DC

## 2015-08-14 MED ORDER — BRIMONIDINE TARTRATE 0.15 % OP SOLN
1.0000 [drp] | Freq: Two times a day (BID) | OPHTHALMIC | Status: DC
  Administered 2015-08-14: 1 [drp] via OPHTHALMIC
  Filled 2015-08-14: qty 5

## 2015-08-14 MED ORDER — WARFARIN SODIUM 2 MG PO TABS: 2.0000 mg | ORAL_TABLET | Freq: Once | ORAL | Status: DC

## 2015-08-14 MED ORDER — FUROSEMIDE 20 MG PO TABS: 20.0000 mg | ORAL_TABLET | Freq: Every day | ORAL | Status: DC

## 2015-08-14 MED ORDER — SODIUM CHLORIDE 0.9 % IV SOLN: 250.0000 mL | INTRAVENOUS | Status: DC | PRN

## 2015-08-14 MED ORDER — SODIUM CHLORIDE 0.9% FLUSH: 3.0000 mL | Freq: Two times a day (BID) | INTRAVENOUS | Status: DC

## 2015-08-14 NOTE — ED Notes (Signed)
Report called to [facility] in ICU, all questions answered. Patient is med/surg OBS overflow

## 2015-08-14 NOTE — Progress Notes (Signed)
CM faxed information to [facility] for review.

## 2015-08-14 NOTE — Discharge Summary (Signed)
Physician Discharge Summary  Candace [name] [id number] DOB: 19-Sep-1922 DOA: 08/13/2015  PCP: Chevis Pretty, FNP  Admit date: 08/13/2015 Discharge date: 08/14/2015  Recommendations for Outpatient Follow-up:  1. Follow up with PCP and cardiology as previously arranged 2. Follow-up heart failure 3. Home health RN for disease management 4. Weigh self daily  Discharge Diagnoses:  1. Acute on chronic valvular CHF 2. AKI superimposed on CKD stage 3 3. Minimal troponin elevation 4. Severe calcific aortic stenosis 5. Moderate to severe tricuspid regurgitation 6. Sick sinus syndrome, A-fib   7. Aortic atherosclerosis  Discharge Condition: Stable Disposition: Home  Diet recommendation: Heart healthy  Filed Weights   08/13/15 2207 08/14/15 0107 08/14/15 0600  Weight: 44 kg (97 lb) 44.4 kg (97 lb 14.2 oz) 44.1 kg (97 lb 3.6 oz)    History of present illness:  82 year old woman with valvular CHF recently admitted, severe aortic stenosis conservatively managed, moderate to severe tricuspid regurgitation, sick sinus syndrome predominantly bradycardia with atrial fibrillation who presented with reported increased shortness of breath.Admitted for mild CHF exacerbation.  Hospital Course:  No issues overnight. Rapidly improved with Lasix. At baseline at this point. Ambulating without difficulty. Long discussion with family members in regard to multiple issues including severe valvular disease which is likely to progress, can manifest as shortness of breath and conservative management has been recommended. Plan for home health RN for symptom management. Chest x-ray was unremarkable and there is no evidence of serious overload on admission. In regard to her kidney function this appears to be stabilized. Tolerate a higher creatinine to prevent fluid accumulation.  1. Acute on chronic vavular CHF. Appears resolved. Mild in nature. Urine output 500. Weight without significant change since  discharge. Chest x-ray with trace bilateral pleural effusions, no volume overload. BNP 1500, troponin flat, minimally elevated. No beta blocker secondary to sick sinus syndrome. 2. Acute kidney injury superimposed on CKD stage III. Likely secondary to diuresis. Need to tolerate a higher creatinine to maintain euvolemic. 3. Minimal troponin elevation unchanged compared to previous admission. EKG marked bradycardia sinus versus atrial flutter. No further evaluation suggested. 4. Severe calcific aortic stenosis. Medical management per cardiology on last admission. 5. Moderate to severe tricuspid regurgitation. Appears stable. 6. Sick sinus syndrome, atrial fibrillation. CHA2DS2-VAScscore 5. 7. Past medical history DVT, PE 8. Aortic atherosclerosis  Consultants:  None  Procedures:  None  Antimicrobials:  None     Discharge Instructions  Discharge Instructions    Activity as tolerated - No restrictions    Complete by:  As directed   Diet - low sodium heart healthy    Complete by:  As directed   Discharge instructions    Complete by:  As directed   Call your physician or seek immediate medical attention for shortness of breath, swelling, pain, dizziness, weakness, rapid heart beat or worsening of condition. Skip Lasix dose on 8/7, then resume 8/9.       Medication List    TAKE these medications   acetaminophen 500 MG tablet Commonly known as:  TYLENOL Take 1 tablet (500 mg total) by mouth every 8 (eight) hours as needed for mild pain. What changed:  when to take this  reasons to take this   bimatoprost 0.01 % Soln Commonly known as:  LUMIGAN Place 1 drop into both eyes at bedtime.   brimonidine 0.1 % Soln Commonly known as:  ALPHAGAN P Apply 1 drop to eye 2 (two) times daily.   denosumab 60 MG/ML Soln injection  Commonly known as:  PROLIA INJECT 60 MG INTO THE SKIN EVERY 6 MONTHS. BRING TO OFFICE FOR ADMINSTRATION   furosemide 20 MG tablet Commonly known as:   LASIX Take one tablet daily What changed:  how much to take  how to take this  when to take this  additional instructions   simvastatin 40 MG tablet Commonly known as:  ZOCOR Take 1 tablet (40 mg total) by mouth at bedtime.   warfarin 2 MG tablet Commonly known as:  COUMADIN TAKE ONE OR TWO TABLETS BY MOUTH DAILY AS DIRECTED AS INSTRUCTED BY ANTICOAGULATION What changed:  See the new instructions.      Allergies  Allergen Reactions  . Codeine Hives and Shortness Of Breath  . Fosamax [Alendronate Sodium] Hives and Nausea And Vomiting  . Penicillins Hives  . Sulfa Antibiotics Itching, Nausea And Vomiting and Rash  . Tramadol Other (See Comments)    Near syncope  . Boniva [Ibandronic Acid]   . Septra [Sulfamethoxazole-Trimethoprim]   . Vioxx [Rofecoxib]     The results of significant diagnostics from this hospitalization (including imaging, microbiology, ancillary and laboratory) are listed below for reference.    Significant Diagnostic Studies: Dg Chest 2 View  Result Date: 08/13/2015 CLINICAL DATA:  82 year old female with shortness breath for 1 week, worse today. Pain in the left side of the neck. EXAM: CHEST  2 VIEW COMPARISON:  Chest x-ray 08/08/2015. FINDINGS: Lung volumes are low. No consolidative airspace disease. Trace bilateral pleural effusions. No evidence of pulmonary edema. Heart size is mildly enlarged. Upper mediastinal contours are distorted by patient's rotation to the left. Multiple thoracic vertebral body compression fractures at T6, T7, T9, T10, T11 and T12, most severe at T9 where there is approximately 70% loss of anterior vertebral body height. Post vertebroplasty changes are again noted at T9, T10 and T11. Kyphosis of the thoracic spine. IMPRESSION: 1. Trace bilateral pleural effusions. 2. Mild cardiomegaly. 3. Aortic atherosclerosis. 4. Multiple vertebral body compression fractures redemonstrated. Electronically Signed   By: Vinnie Langton M.D.   On:  08/13/2015 23:15   Microbiology: Recent Results (from the past 240 hour(s))  MRSA PCR Screening     Status: None   Collection Time: 08/14/15  1:21 AM  Result Value Ref Range Status   MRSA by PCR NEGATIVE NEGATIVE Final    Comment:        The GeneXpert MRSA Assay (FDA approved for NASAL specimens only), is one component of a comprehensive MRSA colonization surveillance program. It is not intended to diagnose MRSA infection nor to guide or monitor treatment for MRSA infections.      Labs: Basic Metabolic Panel:  Recent Labs Lab 08/09/15 0541 08/10/15 0552 08/11/15 0518 08/13/15 2210 08/14/15 0800  NA 140 138 133* 133* 135  K 3.7 4.0 3.9 4.8 5.1  CL 105 106 101 100* 100*  CO2 29 26 27 27 28   GLUCOSE 99 87 111* 139* 101*  BUN 14 16 20  30* 29*  CREATININE 1.50* 1.62* 1.80* 1.84* 1.84*  CALCIUM 8.3* 7.9* 8.1* 9.3 9.1  MG  --  2.0  --   --   --    Liver Function Tests:  Recent Labs Lab 08/08/15 1548 08/09/15 0541 08/13/15 2210  AST 18 18 20   ALT 19 17 13*  ALKPHOS 50 46 44  BILITOT 0.7 0.7 0.5  PROT 6.8 6.4* 6.0*  ALBUMIN 3.7 3.4* 3.4*   CBC:  Recent Labs Lab 08/08/15 1548 08/09/15 0541 08/10/15 0552 08/13/15 2210  08/14/15 0800  WBC 4.7 5.6 4.8 6.6 6.6  NEUTROABS 3.5 3.9  --  4.2  --   HGB 12.4 12.9 11.6* 11.8* 12.5  HCT 38.5 39.9 36.0 36.3 38.2  MCV 90.2 90.7 90.5 89.9 89.3  PLT 239 266 217 229 244   Cardiac Enzymes:  Recent Labs Lab 08/08/15 1548 08/09/15 0058 08/09/15 0542 08/09/15 1221 08/13/15 2210  CKTOTAL  --  51  --   --   --   CKMB  --  2.7  --   --   --   TROPONINI 0.03* 0.03* 0.03* 0.03* 0.04*    Recent Labs  08/08/15 1548 08/13/15 2210  BNP 940.0* 1,501.0*    Principal Problem:   Acute congestive heart failure (HCC) Active Problems:   Atrial fibrillation (HCC)   Long term current use of anticoagulant therapy   Severe aortic stenosis   CKD (chronic kidney disease) stage 3, GFR 30-59 ml/min   Sick sinus syndrome  ([place])   Time coordinating discharge: 50 minutes  Signed:  Murray Hodgkins, MD [facility] 08/14/2015, 6:44 PM   By signing my name below, I, Hilbert Odor, attest that this documentation has been prepared under the direction and in the presence of Ho Parisi P. Sarajane Jews, MD. Electronically signed: Hilbert Odor, Scribe.  08/14/15,   I personally performed the services described in this documentation. All medical record entries made by the scribe were at my direction. I have reviewed the chart and agree that the record reflects my personal performance and is accurate and complete. Murray Hodgkins, MD

## 2015-08-14 NOTE — H&P (Signed)
History and Physical    Candace [name] [id number] DOB: 12-08-1922 DOA: 08/13/2015  PCP: Chevis Pretty, FNP  Patient coming from: home  Chief Complaint:  sob  HPI: Candace [name] is a 82 y.o. female with medical history significant of severe aortic stenosis, valvular CHF with likely diastolic component, HTN, afib on coumadin comes in with sob and swelling in her legs.  Pt was just d/c 2 days ago for a chf exac, she says she had no swelling at that time and felt good.  She is taking her pills and watching her salt intake at home.  She reports her legs are swelling (although I appreciate no swelling) and reports she has gained no weight in the last 2 days.  She denies any fevers.  Pt referred for admission for chf exacerbation.  Pt oxygen sats are normal on RA, she has chronic bradycardia which is being conservatively managed.  ED Course:  Lasix 20mg  iv once  Review of Systems: As per HPI otherwise 10 point review of systems negative.   Past Medical History:  Diagnosis Date  . Aortic stenosis   . Atrial fibrillation ([place])   . DVT (deep venous thrombosis) ([place])   . Essential hypertension   . Glaucoma   . History of skin cancer 2016  . Hypercholesterolemia   . PE (pulmonary embolism)     Past Surgical History:  Procedure Laterality Date  . CATARACT EXTRACTION    . CHOLECYSTECTOMY    . KYPHOPLASTY    . SKIN CANCER EXCISION Left 2016   neck     reports that she has never smoked. Her smokeless tobacco use includes Snuff. She reports that she does not drink alcohol or use drugs.  Allergies  Allergen Reactions  . Codeine Hives and Shortness Of Breath  . Fosamax [Alendronate Sodium] Hives and Nausea And Vomiting  . Penicillins Hives  . Sulfa Antibiotics Itching, Nausea And Vomiting and Rash  . Tramadol Other (See Comments)    Near syncope  . Boniva [Ibandronic Acid]   . Septra [Sulfamethoxazole-Trimethoprim]   . Vioxx [Rofecoxib]     Family History  Problem  Relation Age of Onset  . Stroke Father   . Heart attack Mother   . Diabetes Brother   . Heart disease Brother   . Early death Sister     Prior to Admission medications   Medication Sig Start Date End Date Taking? Authorizing Provider  bimatoprost (LUMIGAN) 0.01 % SOLN Place 1 drop into both eyes at bedtime. 03/29/15  Yes [name] Done, FNP  brimonidine (ALPHAGAN P) 0.1 % SOLN Apply 1 drop to eye 2 (two) times daily.    Yes Historical Provider, MD  denosumab (PROLIA) 60 MG/ML SOLN injection INJECT 60 MG INTO THE SKIN EVERY 6 MONTHS. BRING TO OFFICE FOR ADMINSTRATION 02/24/15  Yes Cherre Robins, PharmD  furosemide (LASIX) 20 MG tablet Take one tablet daily Patient taking differently: Take 20 mg by mouth daily. Take one tablet daily 07/28/15  Yes [name] Done, FNP  simvastatin (ZOCOR) 40 MG tablet Take 1 tablet (40 mg total) by mouth at bedtime. 06/10/15  Yes [name] Done, FNP  warfarin (COUMADIN) 2 MG tablet TAKE ONE OR TWO TABLETS BY MOUTH DAILY AS DIRECTED AS INSTRUCTED BY ANTICOAGULATION Patient taking differently: TAKE ONE TABLET BY MOUTH ONCE DAILY IN THE EVENING 06/08/15  Yes Fransisca Kaufmann Dettinger, MD  acetaminophen (TYLENOL) 500 MG tablet Take 500 mg by mouth as needed for pain.    Historical Provider, MD  Physical Exam: Vitals:   08/13/15 2230 08/13/15 2300 08/13/15 2330 08/14/15 0000  BP: 131/58 132/58 137/61 161/66  Pulse: (!) 48 87 (!) 43 (!) 43  Resp: 14 14 16 23   Temp:      TempSrc:      SpO2: 94% 98% 96% 96%  Weight:          Constitutional: NAD, calm, comfortable Vitals:   08/13/15 2230 08/13/15 2300 08/13/15 2330 08/14/15 0000  BP: 131/58 132/58 137/61 161/66  Pulse: (!) 48 87 (!) 43 (!) 43  Resp: 14 14 16 23   Temp:      TempSrc:      SpO2: 94% 98% 96% 96%  Weight:       Eyes: PERRL, lids and conjunctivae normal ENMT: Mucous membranes are moist. Posterior pharynx clear of any exudate or lesions.Normal dentition.  Neck: normal, supple, no  masses, no thyromegaly Respiratory: clear to auscultation bilaterally, no wheezing, no crackles. Normal respiratory effort. No accessory muscle use.  Cardiovascular: Regular rate and rhythm, no murmurs / rubs / gallops. No extremity edema. 2+ pedal pulses. No carotid bruits.  Abdomen: no tenderness, no masses palpated. No hepatosplenomegaly. Bowel sounds positive.  Musculoskeletal: no clubbing / cyanosis. No joint deformity upper and lower extremities. Good ROM, no contractures. Normal muscle tone.  Skin: no rashes, lesions, ulcers. No induration Neurologic: CN 2-12 grossly intact. Sensation intact, DTR normal. Strength 5/5 in all 4.  Psychiatric: Normal judgment and insight. Alert and oriented x 3. Normal mood.    Labs on Admission: I have personally reviewed following labs and imaging studies  CBC:  Recent Labs Lab 08/08/15 1548 08/09/15 0541 08/10/15 0552 08/13/15 2210  WBC 4.7 5.6 4.8 6.6  NEUTROABS 3.5 3.9  --  4.2  HGB 12.4 12.9 11.6* 11.8*  HCT 38.5 39.9 36.0 36.3  MCV 90.2 90.7 90.5 89.9  PLT 239 266 217 [id number]   Basic Metabolic Panel:  Recent Labs Lab 08/08/15 1548 08/09/15 0541 08/10/15 0552 08/11/15 0518 08/13/15 2210  NA 139 140 138 133* 133*  K 3.7 3.7 4.0 3.9 4.8  CL 108 105 106 101 100*  CO2 25 29 26 27 27   GLUCOSE 89 99 87 111* 139*  BUN 14 14 16 20  30*  CREATININE 1.52* 1.50* 1.62* 1.80* 1.84*  CALCIUM 8.6* 8.3* 7.9* 8.1* 9.3  MG  --   --  2.0  --   --    GFR: Estimated Creatinine Clearance: 14.1 mL/min (by C-G formula based on SCr of 1.84 mg/dL). Liver Function Tests:  Recent Labs Lab 08/08/15 1548 08/09/15 0541 08/13/15 2210  AST 18 18 20   ALT 19 17 13*  ALKPHOS 50 46 44  BILITOT 0.7 0.7 0.5  PROT 6.8 6.4* 6.0*  ALBUMIN 3.7 3.4* 3.4*   Coagulation Profile:  Recent Labs Lab 08/08/15 1548 08/10/15 0552 08/11/15 0518 08/13/15 2210  INR 4.33* 2.91 2.77 1.98   Cardiac Enzymes:  Recent Labs Lab 08/08/15 1548 08/09/15 0058  08/09/15 0542 08/09/15 1221 08/13/15 2210  CKTOTAL  --  51  --   --   --   CKMB  --  2.7  --   --   --   TROPONINI 0.03* 0.03* 0.03* 0.03* 0.04*   Urine analysis:    Component Value Date/Time   COLORURINE YELLOW 06/06/2009 2144   APPEARANCEUR CLEAR 06/06/2009 2144   LABSPEC 1.015 06/06/2009 2144   PHURINE 5.5 06/06/2009 2144   [place] 06/06/2009 2144   HGBUR TRACE (A) 06/06/2009 2144  [place] NEGATIVE 06/06/2009 2144   [place] 06/06/2009 2144   PROTEINUR NEGATIVE 06/06/2009 2144   UROBILINOGEN 0.2 06/06/2009 2144   NITRITE NEGATIVE 06/06/2009 2144   LEUKOCYTESUR SMALL (A) 06/06/2009 2144    Radiological Exams on Admission: Dg Chest 2 View  Result Date: 08/13/2015 CLINICAL DATA:  82 year old female with shortness breath for 1 week, worse today. Pain in the left side of the neck. EXAM: CHEST  2 VIEW COMPARISON:  Chest x-ray 08/08/2015. FINDINGS: Lung volumes are low. No consolidative airspace disease. Trace bilateral pleural effusions. No evidence of pulmonary edema. Heart size is mildly enlarged. Upper mediastinal contours are distorted by patient's rotation to the left. Multiple thoracic vertebral body compression fractures at T6, T7, T9, T10, T11 and T12, most severe at T9 where there is approximately 70% loss of anterior vertebral body height. Post vertebroplasty changes are again noted at T9, T10 and T11. Kyphosis of the thoracic spine. IMPRESSION: 1. Trace bilateral pleural effusions. 2. Mild cardiomegaly. 3. Aortic atherosclerosis. 4. Multiple vertebral body compression fractures redemonstrated. Electronically Signed   By: Vinnie Langton M.D.   On: 08/13/2015 23:15    EKG: Independently reviewed.  afib   Assessment/Plan 82 yo female with mild chf exacerbation  Principal Problem:   Acute congestive heart failure ([place])- very mild.  Pt oxygen sats normal on RA and I appreciate no significant edema.  Pt breathing normally.  Will give lasix 40mg  iv  daily.  Can likely d/c home in 24 hours. Will not repeat echo.   Active Problems:   Atrial fibrillation ([place])- bradycardic, rate controlled.  Not on bblocker.  Chronically on coumadin   DVT (deep venous thrombosis), right- noted, coumadin   Long term current use of anticoagulant therapy- pharm to dose coumadin   Severe aortic stenosis- noted   Bilateral pleural effusion   CKD (chronic kidney disease) stage 3, GFR 30-59 ml/min   Sick sinus syndrome ([place])- noted   obs on medical bed.   DVT prophylaxis: on coumadin Code Status:  DNR  Avrom Robarts A MD [facility]  If 7PM-7AM, please contact night-coverage [email or web address] Password [facility]  08/14/2015, 12:35 AM

## 2015-08-14 NOTE — Progress Notes (Signed)
ANTICOAGULATION CONSULT NOTE - Initial Consult  Pharmacy Consult for Coumadin Indication: atrial fibrillation  Allergies  Allergen Reactions  . Codeine Hives and Shortness Of Breath  . Fosamax [Alendronate Sodium] Hives and Nausea And Vomiting  . Penicillins Hives  . Sulfa Antibiotics Itching, Nausea And Vomiting and Rash  . Tramadol Other (See Comments)    Near syncope  . Boniva [Ibandronic Acid]   . Septra [Sulfamethoxazole-Trimethoprim]   . Vioxx [Rofecoxib]     Patient Measurements: Height: 5\' 1"  (154.9 cm) Weight: 97 lb 3.6 oz (44.1 kg) IBW/kg (Calculated) : 47.8   Vital Signs: Temp: 97 F (36.1 C) (08/06 0825) Temp Source: Oral (08/06 0825) BP: 137/50 (08/06 0825) Pulse Rate: 42 (08/06 0825)  Labs:  Recent Labs  08/13/15 2210 08/14/15 0800  HGB 11.8* 12.5  HCT 36.3 38.2  PLT 229 244  LABPROT 22.8* 22.0*  INR 1.98 1.89  CREATININE 1.84* 1.84*  TROPONINI 0.04*  --     Estimated Creatinine Clearance: 14.1 mL/min (by C-G formula based on SCr of 1.84 mg/dL).   Medical History: Past Medical History:  Diagnosis Date  . Aortic stenosis   . Atrial fibrillation ([place])   . DVT (deep venous thrombosis) ([place])   . Essential hypertension   . Glaucoma   . History of skin cancer 2016  . Hypercholesterolemia   . PE (pulmonary embolism)     Medications:  Prescriptions Prior to Admission  Medication Sig Dispense Refill Last Dose  . bimatoprost (LUMIGAN) 0.01 % SOLN Place 1 drop into both eyes at bedtime. 1 Bottle 1 08/12/2015 at Unknown time  . brimonidine (ALPHAGAN P) 0.1 % SOLN Apply 1 drop to eye 2 (two) times daily.    08/13/2015 at Unknown time  . denosumab (PROLIA) 60 MG/ML SOLN injection INJECT 60 MG INTO THE SKIN EVERY 6 MONTHS. BRING TO OFFICE FOR ADMINSTRATION 1 each 0 UNKNOWN  . furosemide (LASIX) 20 MG tablet Take one tablet daily (Patient taking differently: Take 20 mg by mouth daily. Take one tablet daily) 90 tablet 1 08/13/2015 at Unknown time  .  simvastatin (ZOCOR) 40 MG tablet Take 1 tablet (40 mg total) by mouth at bedtime. 90 tablet 1 08/13/2015 at Unknown time  . warfarin (COUMADIN) 2 MG tablet TAKE ONE OR TWO TABLETS BY MOUTH DAILY AS DIRECTED AS INSTRUCTED BY ANTICOAGULATION (Patient taking differently: TAKE ONE TABLET BY MOUTH ONCE DAILY IN THE EVENING) 90 tablet 0 08/13/2015 at 1700  . acetaminophen (TYLENOL) 500 MG tablet Take 500 mg by mouth as needed for pain.   unknown    Assessment: Anticoagulation Dose Instructions as of 07/13/2015    Total Sun Mon Tue Wed Thu Fri Sat  New Dose 14 mg 2 mg 2 mg 2 mg 2 mg 2 mg 2 mg 2 mg    (2 mg x 1) (2 mg x 1) (2 mg x 1) (2 mg x 1) (2 mg x 1) (2 mg x 1) (2 mg x 1   Continuation of Coumadin PTA for AFIB INR below goal this AM No signs of bleeding  Goal of Therapy:  INR 2-3 Monitor platelets by anticoagulation protocol: Yes   Plan:  Coumadin 2 mg po x 1 dose today (home regiment) INR/PT daily Monitor CBC, signs of bleeding  Candace [name], Candace [name] 08/14/2015,10:12 AM

## 2015-08-14 NOTE — Progress Notes (Signed)
PROGRESS NOTE  Candace [name] [id number] DOB: [birth year] DOA: 08/13/2015 PCP: Chevis Pretty, FNP  Brief Narrative: 82 year old woman with valvular CHF recently admitted, severe aortic stenosis conservatively managed, moderate to severe tricuspid regurgitation, sick sinus syndrome predominantly bradycardia with atrial fibrillation who presented with reported increased shortness of breath. Noted by emergency department physician of a nontoxic, no acute distress and no hypoxia. Admitted for mild CHF exacerbation.  Assessment/Plan: 1. Acute on chronic vavular CHF. Mild. Urine output 500. Weight without significant change since discharge. Chest x-ray with trace bilateral pleural effusions, no volume overload. BNP 1500, troponin flat, minimally elevated. No beta blocker secondary to sick sinus syndrome. 2. Acute kidney injury superimposed on CKD stage III. Likely secondary to diuresis. Repeat BMP pending. 3. Minimal troponin elevation unchanged compared to previous admission. EKG marked bradycardia sinus versus atrial flutter. No further evaluation suggested. 4. Severe calcific aortic stenosis. Medical management per cardiology on last admission. 5. Moderate to severe tricuspid regurgitation. Appears stable. 6. Sick sinus syndrome, atrial fibrillation. CHA2DS2-VAScscore 5. 7. Past medical history DVT, PE 8. Aortic atherosclerosis   Appears at baseline. Resume home medications.  Change to oral diuretics.  Follow up BMP.  Likely home today  DVT prophylaxis: Coumadin Code Status: DNR Family Communication: No family at bedside Disposition Plan: Home  Murray Hodgkins, MD  [facility] Direct contact: [phone] --[place] [place]  --[email or web address]; password [email or web address]  7PM-7AM contact night coverage as above 08/14/2015, 9:31 AM  LOS: 0 days   Consultants:  None  Procedures:  None  Antimicrobials:  None  HPI/Subjective: She is doing well today. Pt is breathing well  today. No pain. Wants to go home.  Objective: Vitals:   08/14/15 0107 08/14/15 0600 08/14/15 0747 08/14/15 0825  BP:   (!) 147/59 (!) 137/50  Pulse:   (!) 43 (!) 42  Resp:   18 16  Temp: 97 F (36.1 C) 97.5 F (36.4 C)  97 F (36.1 C)  TempSrc: Oral Oral  Oral  SpO2:   98% 100%  Weight: 44.4 kg (97 lb 14.2 oz) 44.1 kg (97 lb 3.6 oz)    Height: 5\' 1"  (1.549 m)       Intake/Output Summary (Last 24 hours) at 08/14/15 0931 Last data filed at 08/14/15 0344  Gross per 24 hour  Intake                0 ml  Output              500 ml  Net             -500 ml     Filed Weights   08/13/15 2207 08/14/15 0107 08/14/15 0600  Weight: 44 kg (97 lb) 44.4 kg (97 lb 14.2 oz) 44.1 kg (97 lb 3.6 oz)    Exam:    Constitutional:  . Appears calm and comfortable. Sitting up in chair, appears vigorous. Respiratory:  . CTA bilaterally, no w/r/r.  . Respiratory effort normal. No retractions or accessory muscle use Cardiovascular:  . Irregular, bradycardic, no r/g. 3/6 holosystolic murmer . No LE extremity edema   . Telemetry A-fib bradycardia Psychiatric:  . Mental status appears to be at baseline. Speech fluent and clear.  I have personally reviewed following labs and imaging studies:  BUN 30, Cr 1.84 on admission, above baseline  BNP 1,501, troponin 0.04 on admission  CBC unremarkable  Scheduled Meds: . brimonidine  1 drop Both Eyes BID  . [START ON 08/15/2015] furosemide  20 mg Oral Daily  . latanoprost  1 drop Both Eyes QHS  . simvastatin  40 mg Oral QHS  . sodium chloride flush  3 mL Intravenous Q12H   Continuous Infusions:   Principal Problem:   Acute congestive heart failure (HCC) Active Problems:   Atrial fibrillation (HCC)   Long term current use of anticoagulant therapy   Severe aortic stenosis   CKD (chronic kidney disease) stage 3, GFR 30-59 ml/min   Sick sinus syndrome ([place])   LOS: 0 days   Time spent 25 minutes  By signing my name below, I, Hilbert Odor, attest that this documentation has been prepared under the direction and in the presence of Adriahna Shearman P. Sarajane Jews, MD. Electronically signed: Hilbert Odor, Scribe.  08/14/15, 8:41 AM  I personally performed the services described in this documentation. All medical record entries made by the scribe were at my direction. I have reviewed the chart and agree that the record reflects my personal performance and is accurate and complete. Murray Hodgkins, MD

## 2015-08-14 NOTE — Care Management Note (Signed)
Case Management Note  Patient Details  Name: Candace [name] MRN: [id number] Date of Birth: 1922/04/25  Subjective/Objective: Patient lives alone but has family support.  Does have walker at home to use.  Family takes patient to appointments as needed.                  Action/Plan:  [facility] RN to follow up with nursing services for health care regarding diagnosis.    Expected Discharge Date:  08/16/15               Expected Discharge Plan:  [place]  In-House Referral:  NA  Discharge planning Services  CM Consult  Post Acute Care Choice:  Home Health Choice offered to:  Patient, Adult Children  DME Arranged:   N/A DME Agency:     HH Arranged:  RN [place] Agency:  [place]  Status of Service:  Completed, signed off  If discussed at [place] of Stay Meetings, dates discussed:    Additional Comments:  Briant Sites, RN 08/14/2015, 1:04 PM

## 2015-08-15 DIAGNOSIS — I350 Nonrheumatic aortic (valve) stenosis: Secondary | ICD-10-CM | POA: Diagnosis not present

## 2015-08-15 DIAGNOSIS — I110 Hypertensive heart disease with heart failure: Secondary | ICD-10-CM | POA: Diagnosis not present

## 2015-08-15 DIAGNOSIS — N189 Chronic kidney disease, unspecified: Secondary | ICD-10-CM | POA: Diagnosis not present

## 2015-08-15 DIAGNOSIS — I495 Sick sinus syndrome: Secondary | ICD-10-CM | POA: Diagnosis not present

## 2015-08-15 DIAGNOSIS — I4891 Unspecified atrial fibrillation: Secondary | ICD-10-CM | POA: Diagnosis not present

## 2015-08-15 DIAGNOSIS — Z7901 Long term (current) use of anticoagulants: Secondary | ICD-10-CM | POA: Diagnosis not present

## 2015-08-15 DIAGNOSIS — I5030 Unspecified diastolic (congestive) heart failure: Secondary | ICD-10-CM | POA: Diagnosis not present

## 2015-08-16 NOTE — Progress Notes (Unsigned)
Patient came in today with her daughter Keoni Nieuwenhuis.  They are concerned because she has been experiencing some shortness of breath since her recent hospitalization.  She has a followup appointment with the cardiology office on Friday 08/19/15 but wanted to see if we could contact [facility] and see if we could get them an appointment sooner with Dr. Percival Spanish.  I checked the patients vitals and they were BP 164/77, pulse 54, oxygen 99%.  I contacted [facility] and explained to them what the patient had requested.  They were able to get her an appointment in their [place], [place] office with Dr. Percival Spanish, who is the patient's regular cardiologist, on Thursday 08/18/15.  The patient was advised of this appointment and instructed that if she began feeling any worse, had any difficulty breathing, any weight gain or edema, sweating, dizziness, that she shoiuld call 911 and go to the nearest hospital.  Patient and daughter voice understanding.

## 2015-08-17 ENCOUNTER — Ambulatory Visit: Payer: Medicare Other | Admitting: Pediatrics

## 2015-08-17 NOTE — Progress Notes (Addendum)
Cardiology Office Note   Date:  08/18/2015   ID:  Candace, [name] [birth year], MRN [id number]  PCP:  Chevis Pretty, FNP  Cardiologist:   Minus Breeding, MD   Chief Complaint  Patient presents with  . Shortness of Breath      History of Present Illness: Candace [name] is a 82 y.o. female who presents for evaluation of atrial fib.  She was in the hospital a couple of days ago with acute on chronic CHF.  She was seen by Dr. Domenic Polite.  I have reviewed these records.  She had severe calcified AS.  She had satge III CKD and minimal troponin elevation.  She had a an echo demonstrating well preserved EF with peak gradient of 53.  She had moderately severe TR.   Conservative therapy was suggested.   Of note this was her second hospital visit this month.    She continues to get some daily shortness of breath but not the severe episodes that led her to the hospital. She's been watching her weight daily but continue to get better scale. She is restricting her salt better but needs probably even more limitation. She said sometimes in a chair and sometimes in the bed but with her head elevated. She has some mild ankle edema. She is short of breath with activity. She's not having any new palpitations, presyncope or syncope. She's not having any chest pressure, neck or arm discomfort.   Past Medical History:  Diagnosis Date  . Aortic stenosis   . Atrial fibrillation ([place])   . DVT (deep venous thrombosis) ([place])   . Essential hypertension   . Glaucoma   . History of skin cancer 2016  . Hypercholesterolemia   . PE (pulmonary embolism)     Past Surgical History:  Procedure Laterality Date  . CATARACT EXTRACTION    . CHOLECYSTECTOMY    . KYPHOPLASTY    . SKIN CANCER EXCISION Left 2016   neck     Current Outpatient Prescriptions  Medication Sig Dispense Refill  . acetaminophen (TYLENOL) 500 MG tablet Take 1 tablet (500 mg total) by mouth every 8 (eight) hours as needed for  mild pain.    . bimatoprost (LUMIGAN) 0.01 % SOLN Place 1 drop into both eyes at bedtime. 1 Bottle 1  . brimonidine (ALPHAGAN P) 0.1 % SOLN Apply 1 drop to eye 2 (two) times daily.     Marland Kitchen denosumab (PROLIA) 60 MG/ML SOLN injection INJECT 60 MG INTO THE SKIN EVERY 6 MONTHS. BRING TO OFFICE FOR ADMINSTRATION 1 each 0  . furosemide (LASIX) 20 MG tablet Take one tablet daily (Patient taking differently: Take 20 mg by mouth daily. Take one tablet daily) 90 tablet 1  . simvastatin (ZOCOR) 40 MG tablet Take 1 tablet (40 mg total) by mouth at bedtime. 90 tablet 1  . warfarin (COUMADIN) 2 MG tablet TAKE ONE OR TWO TABLETS BY MOUTH DAILY AS DIRECTED AS INSTRUCTED BY ANTICOAGULATION (Patient taking differently: TAKE ONE TABLET BY MOUTH ONCE DAILY IN THE EVENING) 90 tablet 0  . amLODipine (NORVASC) 2.5 MG tablet Take 0.5 tablets (1.25 mg total) by mouth daily. 15 tablet 6  . nitroGLYCERIN (NITROSTAT) 0.4 MG SL tablet Place 1 tablet (0.4 mg total) under the tongue every 5 (five) minutes as needed for chest pain. 25 tablet 3   No current facility-administered medications for this visit.     Allergies:   Codeine; Fosamax [alendronate sodium]; Penicillins; Sulfa antibiotics; Tramadol; Boniva [ibandronic acid];  Septra [sulfamethoxazole-trimethoprim]; and Vioxx [rofecoxib]    ROS:  Please see the history of present illness.   Otherwise, review of systems are positive for none.   All other systems are reviewed and negative.    PHYSICAL EXAM: VS:  BP (!) 160/71   Pulse (!) 46   Ht 4\' 8"  (1.422 m)   Wt 95 lb (43.1 kg)   SpO2 100% Comment: on roon air  BMI 21.30 kg/m  , BMI Body mass index is 21.3 kg/m. GENERAL:  Frail appearing HEENT:  Pupils equal round and reactive, fundi not visualized, oral mucosa unremarkable NECK:  No jugular venous distention, waveform within normal limits, carotid upstroke brisk and symmetric, no bruits, no thyromegaly LYMPHATICS:  No cervical, inguinal adenopathy LUNGS:  Clear to  auscultation bilaterally BACK:  No CVA tenderness, lordosis CHEST:  Unremarkable HEART:  PMI not displaced or sustained,S1 and S2 within normal limits, no S3, no S4, no clicks, no rubs, [id number] apical systolic murmur radiating out the aortic outflow tract and mid peaking, no diastolic murmurs ABD:  Flat, positive bowel sounds normal in frequency in pitch, no bruits, no rebound, no guarding, no midline pulsatile mass, no hepatomegaly, no splenomegaly EXT:  2 plus pulses upper, diminished dorsalis pedis and posterior tibialis bilaterally, mild ankle edema, no cyanosis no clubbing SKIN:  No rashes no nodules, dependent rubor NEURO:  Cranial nerves II through XII grossly intact, motor grossly intact throughout PSYCH:  Cognitively intact, oriented to person place and time    EKG:  EKG is not ordered today.    Recent Labs: 08/09/2015: TSH 4.200 08/10/2015: Magnesium 2.0 08/13/2015: ALT 13; B Natriuretic Peptide 1,501.0 08/14/2015: BUN 29; Creatinine, Ser 1.84; Hemoglobin 12.5; Platelets 244; Potassium 5.1; Sodium 135    Lipid Panel    Component Value Date/Time   CHOL 191 06/10/2015 1614   TRIG 165 (H) 06/10/2015 1614   TRIG 142 01/28/2014 1711   HDL 47 06/10/2015 1614   HDL 61 01/28/2014 1711   CHOLHDL 4.1 06/10/2015 1614   LDLCALC 111 (H) 06/10/2015 1614   LDLCALC 62 05/15/2013 1648      Wt Readings from Last 3 Encounters:  08/18/15 95 lb (43.1 kg)  08/14/15 97 lb 3.6 oz (44.1 kg)  08/11/15 97 lb 12.8 oz (44.4 kg)      Other studies Reviewed: Additional studies/ records that were reviewed today include: Hospital records with recent admissions. . Review of the above records demonstrates:  Please see elsewhere in the note.     ASSESSMENT AND PLAN:  ATRIAL FIB:  Candace [name] has a CHA2DS2 - VASc score of 5.  She tolerates anticoagulation.  She has had sick sinus syndrome with bradycardia.   She will continue with therapies with the change as listed below  AORTIC STENOSIS:   This is severe.  Her family is interested in hearing about TAVR although I explained that she might not be a candidate for this given her renal function and overall frail state.  We could explore this however if conservative therapy does not work.  In the meantime we are going to control HTN and we talked at length about salt restriction and daily weights.    I will also give her sublingual nitroglycerin in case she should have any severe episodes of shortness of breath. She's using this at that point she would be calling 911.  CKD:  We will follow frequent labs.   TR:  Moderate.  Continue current therapy.  HTN:  Her BP is not at target.  I will add very low dose Norvasc.   Current medicines are reviewed at length with the patient today.  The patient does not have concerns regarding medicines.  The following changes have been made:  As above  Labs/ tests ordered today include:  No orders of the defined types were placed in this encounter.    Disposition:   FU with me in two weeks.     Signed, Minus Breeding, MD  08/18/2015 12:23 PM    [place]

## 2015-08-18 ENCOUNTER — Ambulatory Visit (INDEPENDENT_AMBULATORY_CARE_PROVIDER_SITE_OTHER): Payer: Medicare Other | Admitting: Cardiology

## 2015-08-18 ENCOUNTER — Encounter: Payer: Self-pay | Admitting: Cardiology

## 2015-08-18 VITALS — BP 160/71 | HR 46 | Ht <= 58 in | Wt 95.0 lb

## 2015-08-18 DIAGNOSIS — R0600 Dyspnea, unspecified: Secondary | ICD-10-CM | POA: Diagnosis not present

## 2015-08-18 DIAGNOSIS — I10 Essential (primary) hypertension: Secondary | ICD-10-CM

## 2015-08-18 DIAGNOSIS — I350 Nonrheumatic aortic (valve) stenosis: Secondary | ICD-10-CM

## 2015-08-18 DIAGNOSIS — N189 Chronic kidney disease, unspecified: Secondary | ICD-10-CM | POA: Diagnosis not present

## 2015-08-18 DIAGNOSIS — Z7901 Long term (current) use of anticoagulants: Secondary | ICD-10-CM | POA: Diagnosis not present

## 2015-08-18 DIAGNOSIS — I495 Sick sinus syndrome: Secondary | ICD-10-CM | POA: Diagnosis not present

## 2015-08-18 DIAGNOSIS — R0602 Shortness of breath: Secondary | ICD-10-CM

## 2015-08-18 DIAGNOSIS — I110 Hypertensive heart disease with heart failure: Secondary | ICD-10-CM | POA: Diagnosis not present

## 2015-08-18 DIAGNOSIS — I5030 Unspecified diastolic (congestive) heart failure: Secondary | ICD-10-CM | POA: Diagnosis not present

## 2015-08-18 DIAGNOSIS — I4891 Unspecified atrial fibrillation: Secondary | ICD-10-CM | POA: Diagnosis not present

## 2015-08-18 MED ORDER — NITROGLYCERIN 0.4 MG SL SUBL: 0.4000 mg | SUBLINGUAL_TABLET | SUBLINGUAL | 3 refills | Status: AC | PRN

## 2015-08-18 MED ORDER — AMLODIPINE BESYLATE 2.5 MG PO TABS: 1.2500 mg | ORAL_TABLET | Freq: Every day | ORAL | 6 refills | Status: DC

## 2015-08-18 NOTE — Patient Instructions (Addendum)
Medication Instructions:   Begin Nitroglycerin as needed for severe chest pain only.  Begin Norvasc 1.25mg  daily (1/2 tab of a 2.5mg  tablet).  Continue all other medications.    Labwork: none  Testing/Procedures: none  Follow-Up:  2 weeks - [place]   Any Other Special Instructions Will Be Listed Below (If Applicable).  TAVR (transcatheter aortic valve replacement) information given today.   If you need a refill on your cardiac medications before your next appointment, please call your pharmacy.

## 2015-08-19 ENCOUNTER — Encounter: Payer: Self-pay | Admitting: Cardiology

## 2015-08-22 ENCOUNTER — Ambulatory Visit (INDEPENDENT_AMBULATORY_CARE_PROVIDER_SITE_OTHER): Payer: Medicare Other | Admitting: Pharmacist

## 2015-08-22 DIAGNOSIS — Z7901 Long term (current) use of anticoagulants: Secondary | ICD-10-CM | POA: Diagnosis not present

## 2015-08-22 DIAGNOSIS — I4891 Unspecified atrial fibrillation: Secondary | ICD-10-CM

## 2015-08-22 DIAGNOSIS — I82401 Acute embolism and thrombosis of unspecified deep veins of right lower extremity: Secondary | ICD-10-CM

## 2015-08-22 LAB — COAGUCHEK XS/INR WAIVED
INR: 3.8 — AB (ref 0.9–1.1)
PROTHROMBIN TIME: 45.7 s

## 2015-08-23 ENCOUNTER — Other Ambulatory Visit: Payer: Self-pay | Admitting: Family Medicine

## 2015-08-23 DIAGNOSIS — Z0289 Encounter for other administrative examinations: Secondary | ICD-10-CM

## 2015-08-24 ENCOUNTER — Ambulatory Visit: Payer: Medicare Other | Admitting: Nurse Practitioner

## 2015-08-24 DIAGNOSIS — I110 Hypertensive heart disease with heart failure: Secondary | ICD-10-CM | POA: Diagnosis not present

## 2015-08-24 DIAGNOSIS — I350 Nonrheumatic aortic (valve) stenosis: Secondary | ICD-10-CM | POA: Diagnosis not present

## 2015-08-24 DIAGNOSIS — I4891 Unspecified atrial fibrillation: Secondary | ICD-10-CM | POA: Diagnosis not present

## 2015-08-24 DIAGNOSIS — N189 Chronic kidney disease, unspecified: Secondary | ICD-10-CM | POA: Diagnosis not present

## 2015-08-24 DIAGNOSIS — I495 Sick sinus syndrome: Secondary | ICD-10-CM | POA: Diagnosis not present

## 2015-08-24 DIAGNOSIS — I5030 Unspecified diastolic (congestive) heart failure: Secondary | ICD-10-CM | POA: Diagnosis not present

## 2015-08-24 DIAGNOSIS — Z7901 Long term (current) use of anticoagulants: Secondary | ICD-10-CM | POA: Diagnosis not present

## 2015-08-29 ENCOUNTER — Ambulatory Visit (INDEPENDENT_AMBULATORY_CARE_PROVIDER_SITE_OTHER): Payer: Medicare Other | Admitting: Pharmacist

## 2015-08-29 ENCOUNTER — Telehealth: Payer: Self-pay | Admitting: *Deleted

## 2015-08-29 DIAGNOSIS — I495 Sick sinus syndrome: Secondary | ICD-10-CM | POA: Diagnosis not present

## 2015-08-29 DIAGNOSIS — I82401 Acute embolism and thrombosis of unspecified deep veins of right lower extremity: Secondary | ICD-10-CM

## 2015-08-29 DIAGNOSIS — I4891 Unspecified atrial fibrillation: Secondary | ICD-10-CM

## 2015-08-29 DIAGNOSIS — I110 Hypertensive heart disease with heart failure: Secondary | ICD-10-CM | POA: Diagnosis not present

## 2015-08-29 DIAGNOSIS — I5030 Unspecified diastolic (congestive) heart failure: Secondary | ICD-10-CM | POA: Diagnosis not present

## 2015-08-29 DIAGNOSIS — Z7901 Long term (current) use of anticoagulants: Secondary | ICD-10-CM

## 2015-08-29 DIAGNOSIS — I350 Nonrheumatic aortic (valve) stenosis: Secondary | ICD-10-CM | POA: Diagnosis not present

## 2015-08-29 DIAGNOSIS — N189 Chronic kidney disease, unspecified: Secondary | ICD-10-CM | POA: Diagnosis not present

## 2015-08-29 LAB — POCT INR: INR: 4

## 2015-08-29 NOTE — Telephone Encounter (Signed)
Recommend hold warfarin for next 2 day - Monday 8/21 and Tuesday 8/22.  Then decrease warfarin dose to 2mg  = 1 tablet mondays, wednesdays and fridays Take 1mg  or 1/2 tablet all other days. Recheck INR in 1 week. Patient, patient's daughter and TIna with [facility] notified of above recommendations.

## 2015-08-29 NOTE — Telephone Encounter (Signed)
PT 47.6 INR 4.0

## 2015-08-30 ENCOUNTER — Encounter: Payer: Self-pay | Admitting: Family

## 2015-08-30 ENCOUNTER — Ambulatory Visit (INDEPENDENT_AMBULATORY_CARE_PROVIDER_SITE_OTHER): Payer: Medicare Other | Admitting: Family

## 2015-08-30 ENCOUNTER — Ambulatory Visit (INDEPENDENT_AMBULATORY_CARE_PROVIDER_SITE_OTHER): Payer: Medicare Other

## 2015-08-30 VITALS — BP 187/81 | HR 48 | Temp 98.0°F | Ht <= 58 in

## 2015-08-30 DIAGNOSIS — M545 Low back pain, unspecified: Secondary | ICD-10-CM

## 2015-08-30 DIAGNOSIS — K5900 Constipation, unspecified: Secondary | ICD-10-CM

## 2015-08-30 LAB — MICROSCOPIC EXAMINATION

## 2015-08-30 LAB — URINALYSIS, COMPLETE
BILIRUBIN UA: NEGATIVE
Glucose, UA: NEGATIVE
KETONES UA: NEGATIVE
Leukocytes, UA: NEGATIVE
NITRITE UA: NEGATIVE
SPEC GRAV UA: 1.01 (ref 1.005–1.030)
UUROB: 0.2 mg/dL (ref 0.2–1.0)
pH, UA: 5.5 (ref 5.0–7.5)

## 2015-08-30 NOTE — Progress Notes (Addendum)
   Subjective:    Patient ID: Candace [name], female    DOB: [birth year], 82 y.o.   MRN: [id number]  Back Pain  This is a new problem. The current episode started in the past 7 days. The problem occurs constantly. The problem has been gradually worsening since onset. The pain is present in the thoracic spine. The quality of the pain is described as aching. The pain is at a severity of 8/10. The pain is moderate. The symptoms are aggravated by bending. Pertinent negatives include no bladder incontinence, bowel incontinence, chest pain, dysuria, fever, headaches, leg pain or tingling. Risk factors include history of osteoporosis. She has tried bed rest (tylenol) for the symptoms. The treatment provided mild relief.      Review of Systems  Constitutional: Negative.  Negative for fever.  HENT: Negative.   Eyes: Negative.   Respiratory: Negative.  Negative for shortness of breath.   Cardiovascular: Negative.  Negative for chest pain and palpitations.  Gastrointestinal: Negative.  Negative for bowel incontinence.  Endocrine: Negative.   Genitourinary: Negative.  Negative for bladder incontinence and dysuria.  Musculoskeletal: Positive for back pain.  Neurological: Negative.  Negative for tingling and headaches.  Hematological: Negative.   Psychiatric/Behavioral: Negative.   All other systems reviewed and are negative.      Objective:   Physical Exam  Constitutional: She is oriented to person, place, and time. She appears well-developed and well-nourished. No distress.  HENT:  Head: Normocephalic and atraumatic.  Eyes: Pupils are equal, round, and reactive to light.  Neck: Normal range of motion. Neck supple. No thyromegaly present.  Cardiovascular: Normal rate, regular rhythm, normal heart sounds and intact distal pulses.   No murmur heard. Pulmonary/Chest: Effort normal and breath sounds normal. No respiratory distress. She has no wheezes.  Abdominal: Soft. Bowel sounds are normal.  She exhibits no distension. There is no tenderness.  Musculoskeletal: Normal range of motion. She exhibits no edema or tenderness.  Negative CVA tenderness, generalized thoracic back pain with movement   Neurological: She is alert and oriented to person, place, and time.  Skin: Skin is warm and dry.  Psychiatric: She has a normal mood and affect. Her behavior is normal. Judgment and thought content normal.  Vitals reviewed.   X-ray- Large amount of stool in colon, multiple back surgeries Preliminary reading by Evelina Dun, FNP [facility]   BP (!) 192/77   Pulse (!) 50   Temp 98 F (36.7 C) (Oral)   Ht 4\' 8"  (1.422 m)      Assessment & Plan:  1. Bilateral low back pain without sciatica Tylenol prn for pain - Urinalysis, Complete - DG Lumbar Spine 2-3 Views; Future  2. Constipation, unspecified constipation type -Force fluids -Mirlax daily for next 3-5 days -RTO prn  Evelina Dun, FNP

## 2015-08-30 NOTE — Patient Instructions (Addendum)
  Start Mirlax daily  Constipation, Adult Constipation is when a person has fewer than three bowel movements a week, has difficulty having a bowel movement, or has stools that are dry, hard, or larger than normal. As people grow older, constipation is more common. A low-fiber diet, not taking in enough fluids, and taking certain medicines may make constipation worse.  CAUSES   Certain medicines, such as antidepressants, pain medicine, iron supplements, antacids, and water pills.   Certain diseases, such as diabetes, irritable bowel syndrome (IBS), thyroid disease, or depression.   Not drinking enough water.   Not eating enough fiber-rich foods.   Stress or travel.   Lack of physical activity or exercise.   Ignoring the urge to have a bowel movement.   Using laxatives too much.  SIGNS AND SYMPTOMS   Having fewer than three bowel movements a week.   Straining to have a bowel movement.   Having stools that are hard, dry, or larger than normal.   Feeling full or bloated.   Pain in the lower abdomen.   Not feeling relief after having a bowel movement.  DIAGNOSIS  Your health care provider will take a medical history and perform a physical exam. Further testing may be done for severe constipation. Some tests may include:  A barium enema X-ray to examine your rectum, colon, and, sometimes, your small intestine.   A sigmoidoscopy to examine your lower colon.   A colonoscopy to examine your entire colon. TREATMENT  Treatment will depend on the severity of your constipation and what is causing it. Some dietary treatments include drinking more fluids and eating more fiber-rich foods. Lifestyle treatments may include regular exercise. If these diet and lifestyle recommendations do not help, your health care provider may recommend taking over-the-counter laxative medicines to help you have bowel movements. Prescription medicines may be prescribed if over-the-counter  medicines do not work.  HOME CARE INSTRUCTIONS   Eat foods that have a lot of fiber, such as fruits, vegetables, whole grains, and beans.  Limit foods high in fat and processed sugars, such as french fries, hamburgers, cookies, candies, and soda.   A fiber supplement may be added to your diet if you cannot get enough fiber from foods.   Drink enough fluids to keep your urine clear or pale yellow.   Exercise regularly or as directed by your health care provider.   Go to the restroom when you have the urge to go. Do not hold it.   Only take over-the-counter or prescription medicines as directed by your health care provider. Do not take other medicines for constipation without talking to your health care provider first.  [place] IF:   You have bright red blood in your stool.   Your constipation lasts for more than 4 days or gets worse.   You have abdominal or rectal pain.   You have thin, pencil-like stools.   You have unexplained weight loss. MAKE SURE YOU:   Understand these instructions.  Will watch your condition.  Will get help right away if you are not doing well or get worse.   This information is not intended to replace advice given to you by your health care provider. Make sure you discuss any questions you have with your health care provider.   Document Released: 09/23/2003 Document Revised: 01/15/2014 Document Reviewed: 10/06/2012 Elsevier Interactive Patient Education [employer].

## 2015-08-31 ENCOUNTER — Telehealth: Payer: Self-pay | Admitting: Nurse Practitioner

## 2015-08-31 NOTE — Progress Notes (Signed)
Cardiology Office Note   Date:  09/02/2015   ID:  Candace [name], Candace [name] 07-16-1922, MRN [id number]  PCP:  Chevis Pretty, FNP  Cardiologist:   Minus Breeding, MD   Chief Complaint  Patient presents with  . Aortic Stenosis      History of Present Illness: Candace [name] is a 82 y.o. female who presents for evaluation of atrial fib.  She was in the hospital recently with acute on chronic CHF.  She was seen by Dr. Domenic Polite.    She had severe calcified AS.  She had satge III CKD and minimal troponin elevation.  She had a an echo demonstrating well preserved EF with peak gradient of 53.  She had moderately severe TR.   Conservative therapy was suggested.   Of note this was her second hospital visit in a month.  I saw her a couple of weeks after this.  I wanted to bring her back for close follow up.  She is doing well although she has some occasional shortness of breath and some mild ankle edema. She's not having any of the acute symptoms related to her recent hospitalizations.  She's watching her salt.  She keeps her feet elevated although not above her heart routinely.  Of note at the last visit I added a low-dose of Norvasc and she seems to tolerate this well with better blood pressure control.   Past Medical History:  Diagnosis Date  . Aortic stenosis   . Atrial fibrillation ([place])   . DVT (deep venous thrombosis) ([place])   . Essential hypertension   . Glaucoma   . History of skin cancer 2016  . Hypercholesterolemia   . PE (pulmonary embolism)     Past Surgical History:  Procedure Laterality Date  . CATARACT EXTRACTION    . CHOLECYSTECTOMY    . KYPHOPLASTY    . SKIN CANCER EXCISION Left 2016   neck     Current Outpatient Prescriptions  Medication Sig Dispense Refill  . acetaminophen (TYLENOL) 500 MG tablet Take 1 tablet (500 mg total) by mouth every 8 (eight) hours as needed for mild pain.    Marland Kitchen amLODipine (NORVASC) 2.5 MG tablet Take 0.5 tablets (1.25 mg total) by  mouth daily. 15 tablet 6  . bimatoprost (LUMIGAN) 0.01 % SOLN Place 1 drop into both eyes at bedtime. 1 Bottle 1  . brimonidine (ALPHAGAN P) 0.1 % SOLN Apply 1 drop to eye 2 (two) times daily.     Marland Kitchen denosumab (PROLIA) 60 MG/ML SOLN injection INJECT 60 MG INTO THE SKIN EVERY 6 MONTHS. BRING TO OFFICE FOR ADMINSTRATION 1 each 0  . furosemide (LASIX) 20 MG tablet Take one tablet daily (Patient taking differently: Take 20 mg by mouth daily. Take one tablet daily) 90 tablet 1  . nitroGLYCERIN (NITROSTAT) 0.4 MG SL tablet Place 1 tablet (0.4 mg total) under the tongue every 5 (five) minutes as needed for chest pain. 25 tablet 3  . simvastatin (ZOCOR) 40 MG tablet Take 1 tablet (40 mg total) by mouth at bedtime. 90 tablet 1  . warfarin (COUMADIN) 2 MG tablet TAKE ONE OR TWO TABLETS BY MOUTH ONCE DAILY AS DIRECTED BY ANTICOAGULATION 90 tablet 1   No current facility-administered medications for this visit.     Allergies:   Codeine; Fosamax [alendronate sodium]; Penicillins; Sulfa antibiotics; Tramadol; Boniva [ibandronic acid]; Septra [sulfamethoxazole-trimethoprim]; and Vioxx [rofecoxib]    ROS:  Please see the history of present illness.   Otherwise, review of systems  are positive for back pain and pain in her feet.   All other systems are reviewed and negative.    PHYSICAL EXAM: VS:  BP 140/72   Pulse (!) 49   Ht 4\' 8"  (1.422 m)   Wt 94 lb (42.6 kg)   BMI 21.07 kg/m  , BMI Body mass index is 21.07 kg/m. GENERAL:  Frail appearing NECK:  No jugular venous distention, waveform within normal limits, carotid upstroke brisk and symmetric, no bruits, no thyromegaly LYMPHATICS:  No cervical, inguinal adenopathy LUNGS:  Clear to auscultation bilaterally BACK:  No CVA tenderness, lordosis CHEST:  Unremarkable HEART:  PMI not displaced or sustained,S1 and S2 within normal limits, no S3, no S4, no clicks, no rubs, 3/6 apical systolic murmur radiating out the aortic outflow tract and mid peaking, no  diastolic murmurs ABD:  Flat, positive bowel sounds normal in frequency in pitch, no bruits, no rebound, no guarding, no midline pulsatile mass, no hepatomegaly, no splenomegaly EXT:  2 plus pulses upper, diminished dorsalis pedis and posterior tibialis bilaterally, mild ankle edema, no cyanosis no clubbing   EKG:  EKG is not  ordered today.    Recent Labs: 08/09/2015: TSH 4.200 08/10/2015: Magnesium 2.0 08/13/2015: ALT 13; B Natriuretic Peptide 1,501.0 08/14/2015: BUN 29; Creatinine, Ser 1.84; Hemoglobin 12.5; Platelets 244; Potassium 5.1; Sodium 135    Lipid Panel    Component Value Date/Time   CHOL 191 06/10/2015 1614   TRIG 165 (H) 06/10/2015 1614   TRIG 142 01/28/2014 1711   HDL 47 06/10/2015 1614   HDL 61 01/28/2014 1711   CHOLHDL 4.1 06/10/2015 1614   LDLCALC 111 (H) 06/10/2015 1614   LDLCALC 62 05/15/2013 1648      Wt Readings from Last 3 Encounters:  09/02/15 94 lb (42.6 kg)  08/18/15 95 lb (43.1 kg)  08/14/15 97 lb 3.6 oz (44.1 kg)      Other studies Reviewed: Additional studies/ records that were reviewed today include:None Review of the above records demonstrates:    ASSESSMENT AND PLAN:  ATRIAL FIB:  Candace [name] has a CHA2DS2 - VASc score of 5.  She tolerates anticoagulation.  She has had sick sinus syndrome with bradycardia.   She will continue with therapies with the change as listed below  AORTIC STENOSIS:  This is severe.  Her family is interested in hearing about TAVR although I explained that she might not be a candidate for this given her renal function and overall frail state.  We could explore this however if conservative therapy does not work and we talked about this again today.  In the meantime we are going to control HTN and we talked at length about salt restriction and daily weights.  Today we spent quite a bit of time talking about PRN dosing of Lasix.  CKD:  We will follow frequent labs.   TR:  Moderate.  Continue current  therapy.  HTN:   Her BP is at target.  She will continue the meds as listed.  Current medicines are reviewed at length with the patient today.  The patient does not have concerns regarding medicines.  The following changes have been made:  As above  Labs/ tests ordered today include:  No orders of the defined types were placed in this encounter.    Disposition:   FU with me in 4 weeks in [place].     Signed, Minus Breeding, MD  09/02/2015 9:11 AM    [facility] As  always need right is it is attention to details

## 2015-08-31 NOTE — Telephone Encounter (Signed)
Patient lost direction for warfarin. Reviewed change in dose with her and she wrote down. Voiced understanding.  Due to recheck INR 09/05/15 by home health.

## 2015-09-01 ENCOUNTER — Other Ambulatory Visit: Payer: Self-pay | Admitting: Family

## 2015-09-01 DIAGNOSIS — I700 Atherosclerosis of aorta: Secondary | ICD-10-CM | POA: Insufficient documentation

## 2015-09-02 ENCOUNTER — Ambulatory Visit (INDEPENDENT_AMBULATORY_CARE_PROVIDER_SITE_OTHER): Payer: Medicare Other | Admitting: Cardiology

## 2015-09-02 ENCOUNTER — Encounter: Payer: Self-pay | Admitting: Cardiology

## 2015-09-02 VITALS — BP 140/72 | HR 49 | Ht <= 58 in | Wt 94.0 lb

## 2015-09-02 DIAGNOSIS — I350 Nonrheumatic aortic (valve) stenosis: Secondary | ICD-10-CM | POA: Diagnosis not present

## 2015-09-02 DIAGNOSIS — R0602 Shortness of breath: Secondary | ICD-10-CM | POA: Diagnosis not present

## 2015-09-02 NOTE — Patient Instructions (Signed)
Medication Instructions:  Continue current medications  Labwork: None Ordered  Testing/Procedures: None Ordered  Follow-Up: Your physician recommends that you schedule a follow-up appointment in: 1 Month in [place]   Any Other Special Instructions Will Be Listed Below (If Applicable).   If you need a refill on your cardiac medications before your next appointment, please call your pharmacy.

## 2015-09-05 ENCOUNTER — Telehealth: Payer: Self-pay | Admitting: Nurse Practitioner

## 2015-09-05 ENCOUNTER — Telehealth: Payer: Self-pay | Admitting: *Deleted

## 2015-09-05 ENCOUNTER — Ambulatory Visit (INDEPENDENT_AMBULATORY_CARE_PROVIDER_SITE_OTHER): Payer: Medicare Other | Admitting: Pharmacist

## 2015-09-05 DIAGNOSIS — I350 Nonrheumatic aortic (valve) stenosis: Secondary | ICD-10-CM | POA: Diagnosis not present

## 2015-09-05 DIAGNOSIS — I495 Sick sinus syndrome: Secondary | ICD-10-CM | POA: Diagnosis not present

## 2015-09-05 DIAGNOSIS — I5030 Unspecified diastolic (congestive) heart failure: Secondary | ICD-10-CM | POA: Diagnosis not present

## 2015-09-05 DIAGNOSIS — I110 Hypertensive heart disease with heart failure: Secondary | ICD-10-CM | POA: Diagnosis not present

## 2015-09-05 DIAGNOSIS — I4891 Unspecified atrial fibrillation: Secondary | ICD-10-CM

## 2015-09-05 DIAGNOSIS — Z7901 Long term (current) use of anticoagulants: Secondary | ICD-10-CM

## 2015-09-05 DIAGNOSIS — N189 Chronic kidney disease, unspecified: Secondary | ICD-10-CM | POA: Diagnosis not present

## 2015-09-05 DIAGNOSIS — I82401 Acute embolism and thrombosis of unspecified deep veins of right lower extremity: Secondary | ICD-10-CM

## 2015-09-05 LAB — POCT INR: INR: 2

## 2015-09-05 NOTE — Telephone Encounter (Signed)
INR has decreased from 4.0 to 2.0.  Recommend continue current warfarin dose of 2mg  tablets - take 1 tablet MWF and 1/2 tablet all other days. Recheck INR in 1 week.  Tina with [facility] notified. Tried to reach patient- left message on VM Also tried to call daughter - LM on VM

## 2015-09-05 NOTE — Telephone Encounter (Signed)
Protime 24.0  INR 2.0

## 2015-09-05 NOTE — Telephone Encounter (Signed)
See other note

## 2015-09-06 ENCOUNTER — Telehealth: Payer: Self-pay | Admitting: Pharmacist

## 2015-09-06 NOTE — Telephone Encounter (Signed)
INR has decreased from 4.0 to 2.0.  Recommend continue current warfarin dose of 2mg  tablets - take 1 tablet MWF and 1/2 tablet all other days. Recheck INR in 1 week.  Tina with [facility] notified.

## 2015-09-12 ENCOUNTER — Other Ambulatory Visit: Payer: Self-pay | Admitting: Pharmacist

## 2015-09-12 DIAGNOSIS — K5900 Constipation, unspecified: Secondary | ICD-10-CM | POA: Diagnosis not present

## 2015-09-12 DIAGNOSIS — S39012A Strain of muscle, fascia and tendon of lower back, initial encounter: Secondary | ICD-10-CM | POA: Diagnosis not present

## 2015-09-12 MED ORDER — DENOSUMAB 60 MG/ML ~~LOC~~ SOLN: SUBCUTANEOUS | 0 refills | Status: AC

## 2015-09-13 ENCOUNTER — Telehealth: Payer: Self-pay

## 2015-09-13 ENCOUNTER — Ambulatory Visit: Payer: Self-pay | Admitting: Pharmacist

## 2015-09-13 ENCOUNTER — Other Ambulatory Visit: Payer: Self-pay | Admitting: Nurse Practitioner

## 2015-09-13 DIAGNOSIS — I350 Nonrheumatic aortic (valve) stenosis: Secondary | ICD-10-CM | POA: Diagnosis not present

## 2015-09-13 DIAGNOSIS — I4891 Unspecified atrial fibrillation: Secondary | ICD-10-CM | POA: Diagnosis not present

## 2015-09-13 DIAGNOSIS — I110 Hypertensive heart disease with heart failure: Secondary | ICD-10-CM | POA: Diagnosis not present

## 2015-09-13 DIAGNOSIS — Z7901 Long term (current) use of anticoagulants: Secondary | ICD-10-CM | POA: Diagnosis not present

## 2015-09-13 DIAGNOSIS — I495 Sick sinus syndrome: Secondary | ICD-10-CM | POA: Diagnosis not present

## 2015-09-13 DIAGNOSIS — I5030 Unspecified diastolic (congestive) heart failure: Secondary | ICD-10-CM | POA: Diagnosis not present

## 2015-09-13 DIAGNOSIS — N189 Chronic kidney disease, unspecified: Secondary | ICD-10-CM | POA: Diagnosis not present

## 2015-09-13 DIAGNOSIS — I82401 Acute embolism and thrombosis of unspecified deep veins of right lower extremity: Secondary | ICD-10-CM

## 2015-09-13 LAB — POCT INR: INR: 1.8

## 2015-09-13 MED ORDER — MEGESTROL ACETATE 400 MG/10ML PO SUSP: 400.0000 mg | Freq: Every day | ORAL | 0 refills | Status: DC

## 2015-09-13 NOTE — Telephone Encounter (Signed)
Will need to be seen for back pain- megace rx sent to pharmacy

## 2015-09-13 NOTE — Telephone Encounter (Signed)
INR was 1.8 today.  Subtherapeutic anticoagulation Recommend increase warfarin dose to 2mg  Mondays, Tuesdays, Wednesdays and Fridays.  Take 1mg  Sunday, Thursdays and Saturdays.  Recheck INR in 1 week. Notified [name] with [facility] and also notified daughter.  Also patient has received Prolia from pharmacy. Next due 09/30/15.  Will have home health administer if they are still coming out then or of not she will bring to office for administration.

## 2015-09-14 ENCOUNTER — Telehealth: Payer: Self-pay | Admitting: Pharmacist

## 2015-09-14 NOTE — Telephone Encounter (Signed)
Results of xray from 08/30/15 discussed with patient.

## 2015-09-15 DIAGNOSIS — Z7901 Long term (current) use of anticoagulants: Secondary | ICD-10-CM | POA: Diagnosis not present

## 2015-09-15 DIAGNOSIS — I5030 Unspecified diastolic (congestive) heart failure: Secondary | ICD-10-CM | POA: Diagnosis not present

## 2015-09-15 DIAGNOSIS — I495 Sick sinus syndrome: Secondary | ICD-10-CM | POA: Diagnosis not present

## 2015-09-15 DIAGNOSIS — N189 Chronic kidney disease, unspecified: Secondary | ICD-10-CM | POA: Diagnosis not present

## 2015-09-15 DIAGNOSIS — I110 Hypertensive heart disease with heart failure: Secondary | ICD-10-CM | POA: Diagnosis not present

## 2015-09-15 DIAGNOSIS — I350 Nonrheumatic aortic (valve) stenosis: Secondary | ICD-10-CM | POA: Diagnosis not present

## 2015-09-15 DIAGNOSIS — I4891 Unspecified atrial fibrillation: Secondary | ICD-10-CM | POA: Diagnosis not present

## 2015-09-18 ENCOUNTER — Emergency Department (HOSPITAL_COMMUNITY)
Admission: EM | Admit: 2015-09-18 | Discharge: 2015-09-19 | Disposition: A | Discharge status: Home / Self Care | Payer: Medicare Other | Attending: Emergency Medicine | Admitting: Emergency Medicine

## 2015-09-18 ENCOUNTER — Encounter (HOSPITAL_COMMUNITY): Payer: Self-pay | Admitting: Emergency Medicine

## 2015-09-18 DIAGNOSIS — I509 Heart failure, unspecified: Secondary | ICD-10-CM | POA: Diagnosis not present

## 2015-09-18 DIAGNOSIS — Z85828 Personal history of other malignant neoplasm of skin: Secondary | ICD-10-CM | POA: Diagnosis not present

## 2015-09-18 DIAGNOSIS — M549 Dorsalgia, unspecified: Secondary | ICD-10-CM | POA: Diagnosis present

## 2015-09-18 DIAGNOSIS — Z7901 Long term (current) use of anticoagulants: Secondary | ICD-10-CM | POA: Diagnosis not present

## 2015-09-18 DIAGNOSIS — N183 Chronic kidney disease, stage 3 (moderate): Secondary | ICD-10-CM | POA: Insufficient documentation

## 2015-09-18 DIAGNOSIS — I130 Hypertensive heart and chronic kidney disease with heart failure and stage 1 through stage 4 chronic kidney disease, or unspecified chronic kidney disease: Secondary | ICD-10-CM | POA: Insufficient documentation

## 2015-09-18 DIAGNOSIS — M546 Pain in thoracic spine: Secondary | ICD-10-CM | POA: Diagnosis not present

## 2015-09-18 MED ORDER — HYDROCODONE-ACETAMINOPHEN 5-325 MG PO TABS
1.0000 | ORAL_TABLET | Freq: Once | ORAL | Status: AC
  Administered 2015-09-18: 1 via ORAL
  Filled 2015-09-18: qty 1

## 2015-09-18 MED ORDER — HYDROCODONE-ACETAMINOPHEN 5-325 MG PO TABS: 1.0000 | ORAL_TABLET | Freq: Four times a day (QID) | ORAL | 0 refills | Status: DC | PRN

## 2015-09-18 NOTE — ED Triage Notes (Signed)
Pt here for back pain to left side from top to bottom x 4 weeks; pt family sts seen at [facility] without improvement

## 2015-09-18 NOTE — ED Provider Notes (Signed)
[facility] DEPT Provider Note   CSN: [id number] Arrival date & time: 09/18/15  1532     History   Chief Complaint Chief Complaint  Patient presents with  . Back Pain    HPI Candace [name] is a 82 y.o. female.  HPI   Patient and the daughter are the historians. Patient is a 82 year old female with a history of aortic stenosis, A. fib, DVT and PE on anticoagulation, osteopenia, who presents to the emergency department with left-sided back pain for 3-4 weeks. She states the pain is constant, nonradiating, worse with movement, walking helps. She is tried Tylenol, heating pad, Biofreeze without relief. Patient was seen by her PCP for this and they obtain lumbar spine x-rays on 08/31/2015. X-rays revealed new compression fracture at T12. Patient denies numbness/timing, weakness, chest, shortness of breath, abdominal pain, trauma to her back, saddle anesthesia, urinary retention or loss of bowel or bladder function..  Past Medical History:  Diagnosis Date  . Aortic stenosis   . Atrial fibrillation ([place])   . DVT (deep venous thrombosis) ([place])   . Essential hypertension   . Glaucoma   . History of skin cancer 2016  . Hypercholesterolemia   . PE (pulmonary embolism)     Patient Active Problem List   Diagnosis Date Noted  . Aortic atherosclerosis ([place]) 09/01/2015  . Sick sinus syndrome ([place]) 08/11/2015  . Non-sustained ventricular tachycardia ([place]) 08/11/2015  . Anticoagulated on Coumadin 08/11/2015  . Acute congestive heart failure ([place]) 08/09/2015  . CKD (chronic kidney disease) stage 3, GFR 30-59 ml/min 08/09/2015  . Elevated troponin 08/08/2015  . Renal insufficiency 08/08/2015  . Severe aortic stenosis 08/05/2015  . Hyperlipidemia 06/10/2015  . Tobacco use disorder 01/28/2014  . Osteoporosis with pathological fracture 08/13/2012  . DVT (deep venous thrombosis), right 05/13/2012  . HTN (hypertension) 05/13/2012  . Long term current use of anticoagulant therapy  05/13/2012  . Atrial fibrillation ([place]) 04/15/2012    Past Surgical History:  Procedure Laterality Date  . CATARACT EXTRACTION    . CHOLECYSTECTOMY    . KYPHOPLASTY    . SKIN CANCER EXCISION Left 2016   neck    OB History    Gravida Para Term Preterm AB Living             4   SAB TAB Ectopic Multiple Live Births                   Home Medications    Prior to Admission medications   Medication Sig Start Date End Date Taking? Authorizing Provider  acetaminophen (TYLENOL) 500 MG tablet Take 1 tablet (500 mg total) by mouth every 8 (eight) hours as needed for mild pain. 08/14/15   Samuella Cota, MD  amLODipine (NORVASC) 2.5 MG tablet Take 0.5 tablets (1.25 mg total) by mouth daily. 08/18/15   Minus Breeding, MD  bimatoprost (LUMIGAN) 0.01 % SOLN Place 1 drop into both eyes at bedtime. 03/29/15   [name] Done, FNP  brimonidine (ALPHAGAN P) 0.1 % SOLN Apply 1 drop to eye 2 (two) times daily.     Historical Provider, MD  denosumab (PROLIA) 60 MG/ML SOLN injection INJECT 60 MG INTO THE SKIN EVERY 6 MONTHS. BRING TO OFFICE FOR ADMINSTRATION 09/12/15   [name] Done, FNP  furosemide (LASIX) 20 MG tablet Take one tablet daily Patient taking differently: Take 20 mg by mouth daily. Take one tablet daily 07/28/15   [name] Done, FNP  HYDROcodone-acetaminophen (NORCO/VICODIN) 5-325 MG tablet Take 1 tablet by  mouth every 6 (six) hours as needed. 09/18/15   Kalman Drape, PA  megestrol (MEGACE) 400 MG/10ML suspension Take 10 mLs (400 mg total) by mouth daily. 09/13/15   [name] Done, FNP  nitroGLYCERIN (NITROSTAT) 0.4 MG SL tablet Place 1 tablet (0.4 mg total) under the tongue every 5 (five) minutes as needed for chest pain. 08/18/15   Minus Breeding, MD  simvastatin (ZOCOR) 40 MG tablet Take 1 tablet (40 mg total) by mouth at bedtime. 06/10/15   [name] Done, FNP  warfarin (COUMADIN) 2 MG tablet TAKE ONE OR TWO TABLETS BY MOUTH ONCE DAILY AS DIRECTED BY  ANTICOAGULATION 08/24/15   [name] Done, FNP    Family History Family History  Problem Relation Age of Onset  . Stroke Father   . Heart attack Mother   . Diabetes Brother   . Heart disease Brother   . Early death Sister     Social History Social History  Substance Use Topics  . Smoking status: Never Smoker  . Smokeless tobacco: Current User    Types: Snuff  . Alcohol use No     Allergies   Codeine; Fosamax [alendronate sodium]; Penicillins; Sulfa antibiotics; Tramadol; Boniva [ibandronic acid]; Septra [sulfamethoxazole-trimethoprim]; and Vioxx [rofecoxib]   Review of Systems Review of Systems  Constitutional: Negative for fever.  Gastrointestinal: Negative for nausea and vomiting.  Musculoskeletal: Positive for back pain. Negative for neck pain.  Skin: Negative for rash.  Neurological: Negative for dizziness, syncope, weakness and numbness.     Physical Exam Updated Vital Signs BP (!) 156/121 (BP Location: Right Arm)   Pulse 72   Temp 98 F (36.7 C) (Oral)   Resp 20   SpO2 98%   Physical Exam  Constitutional: She appears well-developed and well-nourished. No distress.  Elderly, thin, alert patient  HENT:  Head: Normocephalic and atraumatic.  Eyes: Conjunctivae are normal.  Cardiovascular: Normal rate.  An irregular rhythm present.  Murmur heard.  Systolic murmur is present  Pulmonary/Chest: Effort normal. No respiratory distress.  Musculoskeletal: Normal range of motion.  Examination back revealed moderate scoliosis, limited range of motion, patient with TTP over T11-L1, no TTP of paraspinal muscles or lateral back.    Neurological: She is alert. Coordination normal.  Skin: Skin is warm and dry. She is not diaphoretic.  Psychiatric: She has a normal mood and affect. Her behavior is normal.  Nursing note and vitals reviewed.    ED Treatments / Results  Labs (all labs ordered are listed, but only abnormal results are displayed) Labs Reviewed -  No data to display  EKG  EKG Interpretation None       Radiology No results found.  Procedures Procedures (including critical care time)  Medications Ordered in ED Medications  HYDROcodone-acetaminophen (NORCO/VICODIN) 5-325 MG per tablet 1 tablet (1 tablet Oral Given 09/18/15 1901)     Initial Impression / Assessment and Plan / ED Course  I have reviewed the triage vital signs and the nursing notes.  Pertinent labs & imaging results that were available during my care of the patient were reviewed by me and considered in my medical decision making (see chart for details).  Clinical Course   Patient with back pain for 3-4 weeks. Patient was seen by her PCP and x-rays were performed. Review of patient's EMR and lumbar x-ray done on 08/31/15 revealed new stable compression fracture at T12. Patient has a history of kyphoplasty at T9-T11. Patient was tender to palpation around the T12 region. No need for further  imaging at this time. No concern for cauda equina syndrome. Pain is chronic in nature. Discharged to home with Norco and discussed use of stool softener. Instructed the patient follow-up with her primary care provider tomorrow regarding her back pain and her elevated blood pressure. Discussed strict return precautions to the ED. Patient and daughter expressed understanding to the discharge instructions.  Pt case discussed and pt seen by Dr. Ashok Cordia who agrees with the above plan.  Final Clinical Impressions(s) / ED Diagnoses   Final diagnoses:  Left-sided thoracic back pain    New Prescriptions New Prescriptions   HYDROCODONE-ACETAMINOPHEN (NORCO/VICODIN) 5-325 MG TABLET    Take 1 tablet by mouth every 6 (six) hours as needed.     Kalman Drape, PA 09/18/15 [place], MD 09/18/15 [phone]

## 2015-09-18 NOTE — Discharge Instructions (Signed)
Take the Norco as needed for pain. Do not take additional tylenol while taking this medication. Take a stool softener as this medication can cause constipation. Follow up with your primary care provider tomorrow to be seen tomorrow to be reevaluated for your back pain.   Return to the emergency department if you experience worsening back pain, numbness of your inner thighs, urinary retention or loss of bowel or bladder function, fevers, chest pain, shortness of breath, or any other concerning symptoms.

## 2015-09-20 ENCOUNTER — Ambulatory Visit (INDEPENDENT_AMBULATORY_CARE_PROVIDER_SITE_OTHER): Payer: Medicare Other | Admitting: Pharmacist

## 2015-09-20 ENCOUNTER — Telehealth: Payer: Self-pay | Admitting: *Deleted

## 2015-09-20 DIAGNOSIS — I110 Hypertensive heart disease with heart failure: Secondary | ICD-10-CM | POA: Diagnosis not present

## 2015-09-20 DIAGNOSIS — Z7901 Long term (current) use of anticoagulants: Secondary | ICD-10-CM | POA: Diagnosis not present

## 2015-09-20 DIAGNOSIS — I350 Nonrheumatic aortic (valve) stenosis: Secondary | ICD-10-CM | POA: Diagnosis not present

## 2015-09-20 DIAGNOSIS — N189 Chronic kidney disease, unspecified: Secondary | ICD-10-CM | POA: Diagnosis not present

## 2015-09-20 DIAGNOSIS — I4891 Unspecified atrial fibrillation: Secondary | ICD-10-CM | POA: Diagnosis not present

## 2015-09-20 DIAGNOSIS — I5030 Unspecified diastolic (congestive) heart failure: Secondary | ICD-10-CM | POA: Diagnosis not present

## 2015-09-20 DIAGNOSIS — I82401 Acute embolism and thrombosis of unspecified deep veins of right lower extremity: Secondary | ICD-10-CM

## 2015-09-20 DIAGNOSIS — I495 Sick sinus syndrome: Secondary | ICD-10-CM | POA: Diagnosis not present

## 2015-09-20 LAB — POCT INR: INR: 1.5

## 2015-09-20 NOTE — Telephone Encounter (Signed)
INR subtherapeutic at 1.5.  Current warfarin dose is 2mg  qd except 1mg  on Sundays, Thursdays and Satrudays. Patient to take 4mg  or 2 tablets of warfarin today.  Then increase dose to 2mg  or 1 tablet daily except on Sundays take 1/2 tablet.  Recheck INR in 1 week Cathy with [facility] notified Also called patient - spoke with granddaughter who helps with medications about dose change.  Voiced understanding.

## 2015-09-20 NOTE — Telephone Encounter (Signed)
Per [facility] nurse   Today INR 1.5 PT 18.5 Current dose is 5mg  MWF 2.5 mg other days Please advise  [id number]

## 2015-09-21 ENCOUNTER — Ambulatory Visit (INDEPENDENT_AMBULATORY_CARE_PROVIDER_SITE_OTHER): Payer: Medicare Other | Admitting: Pharmacist

## 2015-09-21 DIAGNOSIS — Z7901 Long term (current) use of anticoagulants: Secondary | ICD-10-CM

## 2015-09-21 DIAGNOSIS — I82401 Acute embolism and thrombosis of unspecified deep veins of right lower extremity: Secondary | ICD-10-CM

## 2015-09-21 NOTE — Progress Notes (Signed)
Error - INR checked by Home health nurse yesterday.

## 2015-09-22 ENCOUNTER — Ambulatory Visit (INDEPENDENT_AMBULATORY_CARE_PROVIDER_SITE_OTHER): Payer: Medicare Other | Admitting: Physician Assistant

## 2015-09-22 ENCOUNTER — Encounter: Payer: Self-pay | Admitting: Physician Assistant

## 2015-09-22 VITALS — BP 160/66 | HR 51 | Temp 97.0°F | Ht <= 58 in | Wt 93.6 lb

## 2015-09-22 DIAGNOSIS — I509 Heart failure, unspecified: Secondary | ICD-10-CM | POA: Diagnosis not present

## 2015-09-22 DIAGNOSIS — R600 Localized edema: Secondary | ICD-10-CM | POA: Diagnosis not present

## 2015-09-22 NOTE — Progress Notes (Signed)
BP (!) 160/66   Pulse (!) 51   Temp 97 F (36.1 C) (Oral)   Ht 4\' 8"  (1.422 m)   Wt 93 lb 9.6 oz (42.5 kg)   SpO2 97%   BMI 20.98 kg/m    Subjective:    Patient ID: Candace [name], female    DOB: 10-Nov-1922, 82 y.o.   MRN: [id number]  HPI: Candace [name] is a 82 y.o. female presenting on 09/22/2015 for Foot Pain (Bilateral foot pain with burning )     Relevant past medical, surgical, family and social history reviewed and updated as indicated. Interim medical history since our last visit reviewed. Allergies and medications reviewed and updated. DATA REVIEWED: CHART IN EPIC  Review of Systems  Constitutional: Negative.  Negative for activity change, fatigue and fever.  HENT: Negative.   Eyes: Negative.   Respiratory: Negative.  Negative for apnea, cough, chest tightness and shortness of breath.   Cardiovascular: Positive for leg swelling. Negative for chest pain and palpitations.  Gastrointestinal: Negative.  Negative for abdominal pain.  Endocrine: Negative.   Genitourinary: Negative.  Negative for dysuria.  Musculoskeletal: Negative.   Skin: Negative.   Neurological: Negative.     Per HPI unless specifically indicated above    Medication List       Accurate as of 09/22/15  4:11 PM. Always use your most recent med list.          acetaminophen 500 MG tablet Commonly known as:  TYLENOL Take 1 tablet (500 mg total) by mouth every 8 (eight) hours as needed for mild pain.   amLODipine 2.5 MG tablet Commonly known as:  NORVASC Take 0.5 tablets (1.25 mg total) by mouth daily.   bimatoprost 0.01 % Soln Commonly known as:  LUMIGAN Place 1 drop into both eyes at bedtime.   brimonidine 0.1 % Soln Commonly known as:  ALPHAGAN P Apply 1 drop to eye 2 (two) times daily.   denosumab 60 MG/ML Soln injection Commonly known as:  PROLIA INJECT 60 MG INTO THE SKIN EVERY 6 MONTHS. BRING TO OFFICE FOR ADMINSTRATION   furosemide 20 MG tablet Commonly known as:   LASIX Take one tablet daily   HYDROcodone-acetaminophen 5-325 MG tablet Commonly known as:  NORCO/VICODIN Take 1 tablet by mouth every 6 (six) hours as needed.   megestrol 400 MG/10ML suspension Commonly known as:  MEGACE Take 10 mLs (400 mg total) by mouth daily.   nitroGLYCERIN 0.4 MG SL tablet Commonly known as:  NITROSTAT Place 1 tablet (0.4 mg total) under the tongue every 5 (five) minutes as needed for chest pain.   simvastatin 40 MG tablet Commonly known as:  ZOCOR Take 1 tablet (40 mg total) by mouth at bedtime.   warfarin 2 MG tablet Commonly known as:  COUMADIN TAKE ONE OR TWO TABLETS BY MOUTH ONCE DAILY AS DIRECTED BY ANTICOAGULATION          Objective:    BP (!) 160/66   Pulse (!) 51   Temp 97 F (36.1 C) (Oral)   Ht 4\' 8"  (1.422 m)   Wt 93 lb 9.6 oz (42.5 kg)   SpO2 97%   BMI 20.98 kg/m   Allergies  Allergen Reactions  . Codeine Hives and Shortness Of Breath  . Fosamax [Alendronate Sodium] Hives and Nausea And Vomiting  . Penicillins Hives  . Sulfa Antibiotics Itching, Nausea And Vomiting and Rash  . Tramadol Other (See Comments)    Near syncope  . Boniva [Ibandronic  Acid]   . Septra [Sulfamethoxazole-Trimethoprim]   . Vioxx [Rofecoxib]     Wt Readings from Last 3 Encounters:  09/22/15 93 lb 9.6 oz (42.5 kg)  09/02/15 94 lb (42.6 kg)  08/18/15 95 lb (43.1 kg)    Physical Exam  Constitutional: She is oriented to person, place, and time. She appears well-developed and well-nourished.  HENT:  Head: Normocephalic and atraumatic.  Eyes: Conjunctivae and EOM are normal. Pupils are equal, round, and reactive to light.  Cardiovascular: S1 normal, S2 normal and normal heart sounds.  An irregular rhythm present. Bradycardia present.   Pulses:      Dorsalis pedis pulses are 1+ on the right side, and 1+ on the left side.       Posterior tibial pulses are 1+ on the right side, and 1+ on the left side.  2+ edema pretibial, warm skin, dusky color.  visualized by Chevis Pretty, appearance as before.  Pulmonary/Chest: Effort normal and breath sounds normal.  Abdominal: Soft. Bowel sounds are normal.  Neurological: She is alert and oriented to person, place, and time. She has normal reflexes.  Skin: Skin is warm and dry. Rash noted. There is erythema. No pallor.  Psychiatric: She has a normal mood and affect. Her behavior is normal. Judgment and thought content normal.    Results for orders placed or performed in visit on 09/20/15  POCT INR  Result Value Ref Range   INR 1.5       Assessment & Plan:   1. Chronic congestive heart failure, unspecified congestive heart failure type (HCC) Continue chronic meds, elevate legs - Compression stockings  2. Edema of both legs Elevate legs, recommend stockings greatly. - Compression stockings  Continue all other maintenance medications as listed above.  Follow up plan: Keep regular follow up, call if anything worsens.   Orders Placed This Encounter  Procedures  . Compression stockings    Educational handout given for edema.  Terald Sleeper PA-C [place] [facility] [address]  [place], [place] [address] [phone]   09/22/2015, 4:11 PM

## 2015-09-22 NOTE — Patient Instructions (Signed)
Peripheral Vascular Disease Peripheral vascular disease (PVD) is a disease of the blood vessels that are not part of your heart and brain. A simple term for PVD is poor circulation. In most cases, PVD narrows the blood vessels that carry blood from your heart to the rest of your body. This can result in a decreased supply of blood to your arms, legs, and internal organs, like your stomach or kidneys. However, it most often affects a person's lower legs and feet. There are two types of PVD.  Organic PVD. This is the more common type. It is caused by damage to the structure of blood vessels.  Functional PVD. This is caused by conditions that make blood vessels contract and tighten (spasm). Without treatment, PVD tends to get worse over time. PVD can also lead to acute ischemic limb. This is when an arm or limb suddenly has trouble getting enough blood. This is a medical emergency. CAUSES Each type of PVD has many different causes. The most common cause of PVD is buildup of a fatty material (plaque) inside of your arteries (atherosclerosis). Small amounts of plaque can break off from the walls of the blood vessels and become lodged in a smaller artery. This blocks blood flow and can cause acute ischemic limb. Other common causes of PVD include:  Blood clots that form inside of blood vessels.  Injuries to blood vessels.  Diseases that cause inflammation of blood vessels or cause blood vessel spasms.  Health behaviors and health history that increase your risk of developing PVD. RISK FACTORS  You may have a greater risk of PVD if you:  Have a family history of PVD.  Have certain medical conditions, including:  High cholesterol.  Diabetes.  High blood pressure (hypertension).  Coronary heart disease.  Past problems with blood clots.  Past injury, such as burns or a broken bone. These may have damaged blood vessels in your limbs.  Buerger disease. This is caused by inflamed blood  vessels in your hands and feet.  Some forms of arthritis.  Rare birth defects that affect the arteries in your legs.  Use tobacco.  Do not get enough exercise.  Are obese.  Are age 50 or older. SIGNS AND SYMPTOMS  PVD may cause many different symptoms. Your symptoms depend on what part of your body is not getting enough blood. Some common signs and symptoms include:  Cramps in your lower legs. This may be a symptom of poor leg circulation (claudication).  Pain and weakness in your legs while you are physically active that goes away when you rest (intermittent claudication).  Leg pain when at rest.  Leg numbness, tingling, or weakness.  Coldness in a leg or foot, especially when compared with the other leg.  Skin or hair changes. These can include:  Hair loss.  Shiny skin.  Pale or bluish skin.  Thick toenails.  Inability to get or maintain an erection (erectile dysfunction). People with PVD are more prone to developing ulcers and sores on their toes, feet, or legs. These may take longer than normal to heal. DIAGNOSIS Your health care provider may diagnose PVD from your signs and symptoms. The health care provider will also do a physical exam. You may have tests to find out what is causing your PVD and determine its severity. Tests may include:  Blood pressure recordings from your arms and legs and measurements of the strength of your pulses (pulse volume recordings).  Imaging studies using sound waves to take pictures of   the blood flow through your blood vessels (Doppler ultrasound).  Injecting a dye into your blood vessels before having imaging studies using:  X-rays (angiogram or arteriogram).  Computer-generated X-rays (CT angiogram).  A powerful electromagnetic field and a computer (magnetic resonance angiogram or MRA). TREATMENT Treatment for PVD depends on the cause of your condition and the severity of your symptoms. It also depends on your age. Underlying  causes need to be treated and controlled. These include long-lasting (chronic) conditions, such as diabetes, high cholesterol, and high blood pressure. You may need to first try making lifestyle changes and taking medicines. Surgery may be needed if these do not work. Lifestyle changes may include:  Quitting smoking.  Exercising regularly.  Following a low-fat, low-cholesterol diet. Medicines may include:  Blood thinners to prevent blood clots.  Medicines to improve blood flow.  Medicines to improve your blood cholesterol levels. Surgical procedures may include:  A procedure that uses an inflated balloon to open a blocked artery and improve blood flow (angioplasty).  A procedure to put in a tube (stent) to keep a blocked artery open (stent implant).  Surgery to reroute blood flow around a blocked artery (peripheral bypass surgery).  Surgery to remove dead tissue from an infected wound on the affected limb.  Amputation. This is surgical removal of the affected limb. This may be necessary in cases of acute ischemic limb that are not improved through medical or surgical treatments. HOME CARE INSTRUCTIONS  Take medicines only as directed by your health care provider.  Do not use any tobacco products, including cigarettes, chewing tobacco, or electronic cigarettes. If you need help quitting, ask your health care provider.  Lose weight if you are overweight, and maintain a healthy weight as directed by your health care provider.  Eat a diet that is low in fat and cholesterol. If you need help, ask your health care provider.  Exercise regularly. Ask your health care provider to suggest some good activities for you.  Use compression stockings or other mechanical devices as directed by your health care provider.  Take good care of your feet.  Wear comfortable shoes that fit well.  Check your feet often for any cuts or sores. SEEK MEDICAL CARE IF:  You have cramps in your legs  while walking.  You have leg pain when you are at rest.  You have coldness in a leg or foot.  Your skin changes.  You have erectile dysfunction.  You have cuts or sores on your feet that are not healing. SEEK IMMEDIATE MEDICAL CARE IF:  Your arm or leg turns cold and blue.  Your arms or legs become red, warm, swollen, painful, or numb.  You have chest pain or trouble breathing.  You suddenly have weakness in your face, arm, or leg.  You become very confused or lose the ability to speak.  You suddenly have a very bad headache or lose your vision.   This information is not intended to replace advice given to you by your health care provider. Make sure you discuss any questions you have with your health care provider.   Document Released: 02/02/2004 Document Revised: 01/15/2014 Document Reviewed: 06/04/2013 Elsevier Interactive Patient Education 2016 [employer]  

## 2015-09-23 DIAGNOSIS — Z7901 Long term (current) use of anticoagulants: Secondary | ICD-10-CM | POA: Diagnosis not present

## 2015-09-23 DIAGNOSIS — I350 Nonrheumatic aortic (valve) stenosis: Secondary | ICD-10-CM | POA: Diagnosis not present

## 2015-09-23 DIAGNOSIS — I4891 Unspecified atrial fibrillation: Secondary | ICD-10-CM | POA: Diagnosis not present

## 2015-09-23 DIAGNOSIS — I5030 Unspecified diastolic (congestive) heart failure: Secondary | ICD-10-CM | POA: Diagnosis not present

## 2015-09-23 DIAGNOSIS — I495 Sick sinus syndrome: Secondary | ICD-10-CM | POA: Diagnosis not present

## 2015-09-23 DIAGNOSIS — I110 Hypertensive heart disease with heart failure: Secondary | ICD-10-CM | POA: Diagnosis not present

## 2015-09-23 DIAGNOSIS — N189 Chronic kidney disease, unspecified: Secondary | ICD-10-CM | POA: Diagnosis not present

## 2015-09-26 ENCOUNTER — Encounter: Payer: Self-pay | Admitting: Family Medicine

## 2015-09-26 ENCOUNTER — Ambulatory Visit (INDEPENDENT_AMBULATORY_CARE_PROVIDER_SITE_OTHER): Payer: Medicare Other | Admitting: Family Medicine

## 2015-09-26 VITALS — Temp 98.6°F | Ht <= 58 in | Wt 95.1 lb

## 2015-09-26 DIAGNOSIS — M545 Low back pain, unspecified: Secondary | ICD-10-CM

## 2015-09-26 MED ORDER — CALCITONIN (SALMON) 200 UNIT/ACT NA SOLN: 1.0000 | Freq: Every day | NASAL | 12 refills | Status: DC

## 2015-09-26 MED ORDER — NABUMETONE 500 MG PO TABS: 1000.0000 mg | ORAL_TABLET | Freq: Two times a day (BID) | ORAL | 2 refills | Status: DC

## 2015-09-26 NOTE — Progress Notes (Signed)
Subjective:  Patient ID: Candace [name], female    DOB: April 09, 1922  Age: 82 y.o. MRN: [id number]  CC: Back Pain   HPI Candace [name] presents for Midline low back pain is approximately the L2 level. This is been going on for several weeks. She says nobody's been able to find out what's wrong. She has some hydrocodone pills that have helped a little bit but not adequately and have not given her relief more than just a minimal amount of time. She says the pain is still about an 8/10. She was seen here on 08/30/2015. She is been now to the hospital for it. The pain is a severe ache. She has a known history of severe osteoporosis and multiple compression fractures. These have been treated with kyphoplasty. Also of note is that she take Coumadin and has allergies to codeine and tramadol.  History Candace [name] has a past medical history of Aortic stenosis; Atrial fibrillation ([place]); DVT (deep venous thrombosis) ([place]); Essential hypertension; Glaucoma; History of skin cancer (2016); Hypercholesterolemia; and PE (pulmonary embolism).   She has a past surgical history that includes Cholecystectomy; Kyphoplasty; Skin cancer excision (Left, 2016); and Cataract extraction.   Her family history includes Diabetes in her brother; Early death in her sister; Heart attack in her mother; Heart disease in her brother; Stroke in her father.She reports that she has never smoked. Her smokeless tobacco use includes Snuff. She reports that she does not drink alcohol or use drugs.  Current Outpatient Prescriptions on File Prior to Visit  Medication Sig Dispense Refill  . acetaminophen (TYLENOL) 500 MG tablet Take 1 tablet (500 mg total) by mouth every 8 (eight) hours as needed for mild pain.    Marland Kitchen amLODipine (NORVASC) 2.5 MG tablet Take 0.5 tablets (1.25 mg total) by mouth daily. 15 tablet 6  . bimatoprost (LUMIGAN) 0.01 % SOLN Place 1 drop into both eyes at bedtime. 1 Bottle 1  . brimonidine (ALPHAGAN P) 0.1 % SOLN  Apply 1 drop to eye 2 (two) times daily.     Marland Kitchen denosumab (PROLIA) 60 MG/ML SOLN injection INJECT 60 MG INTO THE SKIN EVERY 6 MONTHS. BRING TO OFFICE FOR ADMINSTRATION 1 each 0  . furosemide (LASIX) 20 MG tablet Take one tablet daily (Patient taking differently: Take 20 mg by mouth daily. Take one tablet daily) 90 tablet 1  . megestrol (MEGACE) 400 MG/10ML suspension Take 10 mLs (400 mg total) by mouth daily. 240 mL 0  . nitroGLYCERIN (NITROSTAT) 0.4 MG SL tablet Place 1 tablet (0.4 mg total) under the tongue every 5 (five) minutes as needed for chest pain. 25 tablet 3  . simvastatin (ZOCOR) 40 MG tablet Take 1 tablet (40 mg total) by mouth at bedtime. 90 tablet 1  . warfarin (COUMADIN) 2 MG tablet TAKE ONE OR TWO TABLETS BY MOUTH ONCE DAILY AS DIRECTED BY ANTICOAGULATION 90 tablet 1   No current facility-administered medications on file prior to visit.     ROS Review of Systems  Constitutional: Negative for activity change, appetite change and fever.  HENT: Negative for congestion, rhinorrhea and sore throat.   Eyes: Negative for visual disturbance.  Respiratory: Negative for cough and shortness of breath.   Cardiovascular: Negative for chest pain and palpitations.  Gastrointestinal: Negative for abdominal pain, diarrhea and nausea.  Genitourinary: Negative for dysuria.  Musculoskeletal: Positive for arthralgias and back pain. Negative for myalgias.    Objective:  Temp 98.6 F (37 C) (Oral)   Ht 4\' 8"  (1.422  m)   Wt 95 lb 2 oz (43.1 kg)   BMI 21.33 kg/m   Physical Exam  Constitutional: She is oriented to person, place, and time. She appears well-developed and well-nourished. No distress.  HENT:  Head: Normocephalic and atraumatic.  Eyes: Conjunctivae are normal. Pupils are equal, round, and reactive to light.  Neck: Normal range of motion. Neck supple. No thyromegaly present.  Cardiovascular: Normal rate, regular rhythm and normal heart sounds.   No murmur  heard. Pulmonary/Chest: Effort normal and breath sounds normal. No respiratory distress. She has no wheezes. She has no rales.  Abdominal: Soft. Bowel sounds are normal. She exhibits no distension. There is no tenderness.  Musculoskeletal: Normal range of motion. She exhibits tenderness (moderate, at L2 for percussion).  Lymphadenopathy:    She has no cervical adenopathy.  Neurological: She is alert and oriented to person, place, and time.  Skin: Skin is warm and dry.  Psychiatric: She has a normal mood and affect. Her behavior is normal. Judgment and thought content normal.    Assessment & Plan:   Candace [name] was seen today for back pain.  Diagnoses and all orders for this visit:  Midline low back pain without sciatica -     MR Lumbar Spine Wo Contrast; Future  Other orders -     calcitonin, salmon, (MIACALCIN) 200 UNIT/ACT nasal spray; Place 1 spray into alternate nostrils daily. -     nabumetone (RELAFEN) 500 MG tablet; Take 2 tablets (1,000 mg total) by mouth 2 (two) times daily. For severe back pain.   I have discontinued Candace [name]'s HYDROcodone-acetaminophen. I am also having her start on calcitonin (salmon) and nabumetone. Additionally, I am having her maintain her brimonidine, bimatoprost, simvastatin, furosemide, acetaminophen, nitroGLYCERIN, amLODipine, warfarin, denosumab, and megestrol.  Meds ordered this encounter  Medications  . calcitonin, salmon, (MIACALCIN) 200 UNIT/ACT nasal spray    Sig: Place 1 spray into alternate nostrils daily.    Dispense:  3.7 mL    Refill:  12  . nabumetone (RELAFEN) 500 MG tablet    Sig: Take 2 tablets (1,000 mg total) by mouth 2 (two) times daily. For severe back pain.    Dispense:  120 tablet    Refill:  2     Follow-up: Return in about 2 weeks (around 10/10/2015).  Claretta Fraise, M.D.

## 2015-09-27 ENCOUNTER — Telehealth: Payer: Self-pay

## 2015-09-27 ENCOUNTER — Ambulatory Visit (INDEPENDENT_AMBULATORY_CARE_PROVIDER_SITE_OTHER): Payer: Medicare Other | Admitting: Pharmacist

## 2015-09-27 DIAGNOSIS — Z7901 Long term (current) use of anticoagulants: Secondary | ICD-10-CM | POA: Diagnosis not present

## 2015-09-27 DIAGNOSIS — I4891 Unspecified atrial fibrillation: Secondary | ICD-10-CM | POA: Diagnosis not present

## 2015-09-27 DIAGNOSIS — I5030 Unspecified diastolic (congestive) heart failure: Secondary | ICD-10-CM | POA: Diagnosis not present

## 2015-09-27 DIAGNOSIS — N189 Chronic kidney disease, unspecified: Secondary | ICD-10-CM | POA: Diagnosis not present

## 2015-09-27 DIAGNOSIS — I350 Nonrheumatic aortic (valve) stenosis: Secondary | ICD-10-CM | POA: Diagnosis not present

## 2015-09-27 DIAGNOSIS — I110 Hypertensive heart disease with heart failure: Secondary | ICD-10-CM | POA: Diagnosis not present

## 2015-09-27 DIAGNOSIS — I82401 Acute embolism and thrombosis of unspecified deep veins of right lower extremity: Secondary | ICD-10-CM

## 2015-09-27 DIAGNOSIS — I495 Sick sinus syndrome: Secondary | ICD-10-CM | POA: Diagnosis not present

## 2015-09-27 LAB — POCT INR: INR: 2.1

## 2015-09-27 NOTE — Telephone Encounter (Signed)
INR therapeutic.  Continue current warfarin dose of 2mg  qd except 1mg  on Sundays.  Recheck INR in 1 week.  Patient was notified of instructions.  Also called Tina with [facility] with order and instructions.  Candace [name] will also given patient her Prolia injection next week at last home health visit.

## 2015-09-27 NOTE — Progress Notes (Signed)
Cardiology Office Note   Date:  09/28/2015   ID:  Candace, [name] 1922-05-14, MRN [id number]  PCP:  Chevis Pretty, FNP  Cardiologist:   Minus Breeding, MD   Chief Complaint  Patient presents with  . Aortic Stenosis      History of Present Illness: Candace [name] is a 82 y.o. female who presents for evaluation of atrial fib and acute on chronic CHF. She had severe calcified AS.  She had satge III CKD and minimal troponin elevation.  She had a an echo demonstrating well preserved EF with peak gradient of 53.  She had moderately severe TR.   Conservative therapy was suggested.    Since I last saw her she has done OK.  She has had no acute SOB.  She did have one ED visit for back pain.  The patient denies any new symptoms such as chest discomfort, neck or arm discomfort. There has been no new shortness of breath, PND or orthopnea. There have been no reported palpitations, presyncope or syncope.  She is weighing herself and watching her salt.   Past Medical History:  Diagnosis Date  . Aortic stenosis   . Atrial fibrillation ([place])   . DVT (deep venous thrombosis) ([place])   . Essential hypertension   . Glaucoma   . History of skin cancer 2016  . Hypercholesterolemia   . PE (pulmonary embolism)     Past Surgical History:  Procedure Laterality Date  . CATARACT EXTRACTION    . CHOLECYSTECTOMY    . KYPHOPLASTY    . SKIN CANCER EXCISION Left 2016   neck     Current Outpatient Prescriptions  Medication Sig Dispense Refill  . acetaminophen (TYLENOL) 500 MG tablet Take 1 tablet (500 mg total) by mouth every 8 (eight) hours as needed for mild pain.    Marland Kitchen amLODipine (NORVASC) 2.5 MG tablet Take 1 tablet (2.5 mg total) by mouth daily. 30 tablet 11  . bimatoprost (LUMIGAN) 0.01 % SOLN Place 1 drop into both eyes at bedtime. 1 Bottle 1  . brimonidine (ALPHAGAN P) 0.1 % SOLN Apply 1 drop to eye 2 (two) times daily.     . calcitonin, salmon, (MIACALCIN) 200 UNIT/ACT nasal  spray Place 1 spray into alternate nostrils daily. 3.7 mL 12  . denosumab (PROLIA) 60 MG/ML SOLN injection INJECT 60 MG INTO THE SKIN EVERY 6 MONTHS. BRING TO OFFICE FOR ADMINSTRATION 1 each 0  . furosemide (LASIX) 20 MG tablet Take one tablet daily (Patient taking differently: Take 20 mg by mouth daily. Take one tablet daily) 90 tablet 1  . nabumetone (RELAFEN) 500 MG tablet Take 2 tablets (1,000 mg total) by mouth 2 (two) times daily. For severe back pain. 120 tablet 2  . nitroGLYCERIN (NITROSTAT) 0.4 MG SL tablet Place 1 tablet (0.4 mg total) under the tongue every 5 (five) minutes as needed for chest pain. 25 tablet 3  . warfarin (COUMADIN) 2 MG tablet TAKE ONE OR TWO TABLETS BY MOUTH ONCE DAILY AS DIRECTED BY ANTICOAGULATION 90 tablet 1  . megestrol (MEGACE) 400 MG/10ML suspension Take 10 mLs (400 mg total) by mouth daily. 240 mL 0  . simvastatin (ZOCOR) 40 MG tablet Take 1 tablet (40 mg total) by mouth at bedtime. (Patient not taking: Reported on 09/28/2015) 90 tablet 1   No current facility-administered medications for this visit.     Allergies:   Codeine; Fosamax [alendronate sodium]; Penicillins; Sulfa antibiotics; Tramadol; Boniva [ibandronic acid]; Septra [sulfamethoxazole-trimethoprim]; and Vioxx [  rofecoxib]    ROS:  Please see the history of present illness.   Otherwise, review of systems are positive for back pain  All other systems are reviewed and negative.    PHYSICAL EXAM: VS:  BP (!) 158/76   Pulse (!) 50   Ht 4\' 8"  (1.422 m)   Wt 96 lb (43.5 kg)   BMI 21.52 kg/m  , BMI Body mass index is 21.52 kg/m. GENERAL:  Frail appearing NECK:  No jugular venous distention, waveform within normal limits, carotid upstroke brisk and symmetric, no bruits, no thyromegaly LUNGS:  Clear to auscultation bilaterally BACK:  No CVA tenderness, lordosis CHEST:  Unremarkable HEART:  PMI not displaced or sustained,S1 and S2 within normal limits, no S3, no S4, no clicks, no rubs, 3/6 apical  systolic murmur radiating out the aortic outflow tract and mid peaking, no diastolic murmurs ABD:  Flat, positive bowel sounds normal in frequency in pitch, no bruits, no rebound, no guarding, no midline pulsatile mass, no hepatomegaly, no splenomegaly EXT:  2 plus pulses upper, diminished dorsalis pedis and posterior tibialis bilaterally, mild ankle edema, no cyanosis no clubbing   EKG:  EKG is not  ordered today.    Recent Labs: 08/09/2015: TSH 4.200 08/10/2015: Magnesium 2.0 08/13/2015: ALT 13; B Natriuretic Peptide 1,501.0 08/14/2015: BUN 29; Creatinine, Ser 1.84; Hemoglobin 12.5; Platelets 244; Potassium 5.1; Sodium 135    Lipid Panel    Component Value Date/Time   CHOL 191 06/10/2015 1614   TRIG 165 (H) 06/10/2015 1614   TRIG 142 01/28/2014 1711   HDL 47 06/10/2015 1614   HDL 61 01/28/2014 1711   CHOLHDL 4.1 06/10/2015 1614   LDLCALC 111 (H) 06/10/2015 1614   LDLCALC 62 05/15/2013 1648      Wt Readings from Last 3 Encounters:  09/28/15 96 lb (43.5 kg)  09/26/15 95 lb 2 oz (43.1 kg)  09/22/15 93 lb 9.6 oz (42.5 kg)      Other studies Reviewed: Additional studies/ records that were reviewed today include:None Review of the above records demonstrates:     ASSESSMENT AND PLAN:  ATRIAL FIB:  Ms. [name] has a CHA2DS2 - VASc score of 5.  She tolerates anticoagulation.  She has had sick sinus syndrome with bradycardia.   She will continue with therapies with the change as listed below  AORTIC STENOSIS:  This is severe.  However, with attention to detail she is staying out of the ED now.  She will continue with current therapy.  We reviewed salt and fluid restriction.  I will check a BMET.   TR:  Moderate.  Continue current therapy.  HTN:   Her BP is elevated and I will increase the Norvasc to 2.5 mg daily  Current medicines are reviewed at length with the patient today.  The patient does not have concerns regarding medicines.  The following changes have been  made:  As above  Labs/ tests ordered today include:     Orders Placed This Encounter  Procedures  . Basic metabolic panel     Disposition:   FU with me in 16 weeks in [place].     Signed, Minus Breeding, MD  09/28/2015 1:47 PM    [facility] As always need right is it is attention to details

## 2015-09-28 ENCOUNTER — Other Ambulatory Visit: Payer: Medicare Other

## 2015-09-28 ENCOUNTER — Encounter: Payer: Self-pay | Admitting: Cardiology

## 2015-09-28 ENCOUNTER — Ambulatory Visit (INDEPENDENT_AMBULATORY_CARE_PROVIDER_SITE_OTHER): Payer: Medicare Other | Admitting: Cardiology

## 2015-09-28 VITALS — BP 158/76 | HR 50 | Ht <= 58 in | Wt 96.0 lb

## 2015-09-28 DIAGNOSIS — I350 Nonrheumatic aortic (valve) stenosis: Secondary | ICD-10-CM

## 2015-09-28 DIAGNOSIS — Z79899 Other long term (current) drug therapy: Secondary | ICD-10-CM

## 2015-09-28 DIAGNOSIS — I10 Essential (primary) hypertension: Secondary | ICD-10-CM

## 2015-09-28 MED ORDER — AMLODIPINE BESYLATE 2.5 MG PO TABS: 2.5000 mg | ORAL_TABLET | Freq: Every day | ORAL | 11 refills | Status: AC

## 2015-09-28 NOTE — Patient Instructions (Signed)
Medication Instructions:  Please increase your Amlodipine to 2.5 mg a day. Continue all other medications as listed.  Labwork: Please have blood work today  (BMP)  Follow-Up: Follow up with Dr Percival Spanish in 4 months.  If you need a refill on your cardiac medications before your next appointment, please call your pharmacy.  Thank you for choosing [facility]!!

## 2015-09-28 NOTE — Addendum Note (Signed)
Addended by: Shellia Cleverly on: 09/28/2015 01:50 PM   Modules accepted: Orders

## 2015-09-29 ENCOUNTER — Encounter (HOSPITAL_COMMUNITY): Payer: Self-pay

## 2015-09-29 ENCOUNTER — Ambulatory Visit (HOSPITAL_COMMUNITY)
Admission: RE | Admit: 2015-09-29 | Discharge: 2015-09-29 | Disposition: A | Discharge status: Home / Self Care | Payer: Medicare Other | Source: Ambulatory Visit | Attending: Family Medicine | Admitting: Family Medicine

## 2015-09-29 DIAGNOSIS — M545 Low back pain, unspecified: Secondary | ICD-10-CM

## 2015-09-29 LAB — BASIC METABOLIC PANEL
BUN / CREAT RATIO: 15 (ref 12–28)
BUN: 20 mg/dL (ref 10–36)
CHLORIDE: 98 mmol/L (ref 96–106)
CO2: 28 mmol/L (ref 18–29)
Calcium: 8.7 mg/dL (ref 8.7–10.3)
Creatinine, Ser: 1.31 mg/dL — ABNORMAL HIGH (ref 0.57–1.00)
GFR, EST AFRICAN AMERICAN: 41 mL/min/{1.73_m2} — AB (ref >59)
GFR, EST NON AFRICAN AMERICAN: 36 mL/min/{1.73_m2} — AB (ref >59)
Glucose: 98 mg/dL (ref 65–99)
Potassium: 4.2 mmol/L (ref 3.5–5.2)
Sodium: 143 mmol/L (ref 134–144)

## 2015-10-03 DIAGNOSIS — H2513 Age-related nuclear cataract, bilateral: Secondary | ICD-10-CM | POA: Diagnosis not present

## 2015-10-03 DIAGNOSIS — H40033 Anatomical narrow angle, bilateral: Secondary | ICD-10-CM | POA: Diagnosis not present

## 2015-10-04 ENCOUNTER — Other Ambulatory Visit: Payer: Self-pay | Admitting: Nurse Practitioner

## 2015-10-05 ENCOUNTER — Ambulatory Visit: Payer: Medicare Other | Admitting: Family Medicine

## 2015-10-05 ENCOUNTER — Ambulatory Visit (INDEPENDENT_AMBULATORY_CARE_PROVIDER_SITE_OTHER): Payer: Medicare Other | Admitting: Family Medicine

## 2015-10-05 ENCOUNTER — Encounter: Payer: Self-pay | Admitting: Family Medicine

## 2015-10-05 VITALS — BP 118/74 | HR 68 | Temp 97.4°F | Ht <= 58 in | Wt 92.1 lb

## 2015-10-05 DIAGNOSIS — M810 Age-related osteoporosis without current pathological fracture: Secondary | ICD-10-CM

## 2015-10-05 DIAGNOSIS — S81802A Unspecified open wound, left lower leg, initial encounter: Secondary | ICD-10-CM

## 2015-10-05 MED ORDER — DENOSUMAB 60 MG/ML ~~LOC~~ SOLN
60.0000 mg | Freq: Once | SUBCUTANEOUS | Status: AC
  Administered 2015-10-05: 60 mg via SUBCUTANEOUS

## 2015-10-05 NOTE — Progress Notes (Signed)
BP 118/74   Pulse 68   Temp 97.4 F (36.3 C) (Oral)   Ht 4\' 8"  (1.422 m)   Wt 92 lb 2 oz (41.8 kg)   BMI 20.65 kg/m    Subjective:    Patient ID: Candace [name], female    DOB: 1922/10/01, 82 y.o.   MRN: [id number]  HPI: Candace [name] is a 82 y.o. female presenting on 10/05/2015 for Wound on back of left leg (hit leg on brick steps last night)   HPI Wound on back of left leg Patient has a small wound on her left calf that she had 1 day ago when she hit her leg against the bricks. She has been having straw-colored drainage out of it but no purulence. It is slightly tender around the area but she has not had any fevers or chills or redness or warmth. She does have some nerve pain down in those legs from the swelling that she gets in her legs. She is taking a water pill currently.  Relevant past medical, surgical, family and social history reviewed and updated as indicated. Interim medical history since our last visit reviewed. Allergies and medications reviewed and updated.  Review of Systems  Constitutional: Negative for chills and fever.  HENT: Negative for congestion, ear discharge and ear pain.   Eyes: Negative for redness and visual disturbance.  Respiratory: Negative for chest tightness and shortness of breath.   Cardiovascular: Negative for chest pain and leg swelling.  Genitourinary: Negative for difficulty urinating and dysuria.  Musculoskeletal: Negative for back pain and gait problem.  Skin: Positive for wound. Negative for color change and rash.  Neurological: Negative for light-headedness and headaches.  Psychiatric/Behavioral: Negative for agitation and behavioral problems.  All other systems reviewed and are negative.   Per HPI unless specifically indicated above      Objective:    BP 118/74   Pulse 68   Temp 97.4 F (36.3 C) (Oral)   Ht 4\' 8"  (1.422 m)   Wt 92 lb 2 oz (41.8 kg)   BMI 20.65 kg/m   Wt Readings from Last 3 Encounters:  10/05/15  92 lb 2 oz (41.8 kg)  09/28/15 96 lb (43.5 kg)  09/26/15 95 lb 2 oz (43.1 kg)    Physical Exam  Constitutional: She is oriented to person, place, and time. She appears well-developed and well-nourished. No distress.  Eyes: Conjunctivae are normal.  Cardiovascular: Normal rate, regular rhythm, normal heart sounds and intact distal pulses.   No murmur heard. Pulmonary/Chest: Effort normal and breath sounds normal. No respiratory distress. She has no wheezes.  Musculoskeletal: Normal range of motion. She exhibits edema (2+ edema). She exhibits no tenderness.  Neurological: She is alert and oriented to person, place, and time. Coordination normal.  Skin: Skin is warm and dry. Lesion (1 cm by a 0.25 cm superficial laceration on left calf. No signs of erythema or warmth or purulence. Straw-colored drainage.) noted. No rash noted. She is not diaphoretic.  Psychiatric: She has a normal mood and affect. Her behavior is normal.  Nursing note and vitals reviewed.  Wound care: Use topical antibiotic and Xeroform gauze and dry roll gauze and then Ace wrap the leg. Change dressing daily and wear Ace wraps at least 12 hours a day     Assessment & Plan:   Problem List Items Addressed This Visit    None    Visit Diagnoses    Osteoporosis    -  Primary  Relevant Medications   denosumab (PROLIA) injection 60 mg (Completed)   Leg wound, left, initial encounter       Small wound on left calf about 1 cm in length by a quarter centimeter with, no signs of infection, straw-colored drainage, use topical antibiotic and gauze and        Follow up plan: No Follow-up on file.  Counseling provided for all of the vaccine components No orders of the defined types were placed in this encounter.   Caryl Pina, MD [facility] 10/05/2015, 4:59 PM

## 2015-10-06 ENCOUNTER — Telehealth: Payer: Self-pay | Admitting: *Deleted

## 2015-10-06 ENCOUNTER — Ambulatory Visit (INDEPENDENT_AMBULATORY_CARE_PROVIDER_SITE_OTHER): Payer: Medicare Other | Admitting: Pharmacist

## 2015-10-06 DIAGNOSIS — Z7901 Long term (current) use of anticoagulants: Secondary | ICD-10-CM | POA: Diagnosis not present

## 2015-10-06 DIAGNOSIS — I110 Hypertensive heart disease with heart failure: Secondary | ICD-10-CM | POA: Diagnosis not present

## 2015-10-06 DIAGNOSIS — I350 Nonrheumatic aortic (valve) stenosis: Secondary | ICD-10-CM | POA: Diagnosis not present

## 2015-10-06 DIAGNOSIS — I4891 Unspecified atrial fibrillation: Secondary | ICD-10-CM | POA: Diagnosis not present

## 2015-10-06 DIAGNOSIS — I5030 Unspecified diastolic (congestive) heart failure: Secondary | ICD-10-CM | POA: Diagnosis not present

## 2015-10-06 DIAGNOSIS — N189 Chronic kidney disease, unspecified: Secondary | ICD-10-CM | POA: Diagnosis not present

## 2015-10-06 DIAGNOSIS — I495 Sick sinus syndrome: Secondary | ICD-10-CM | POA: Diagnosis not present

## 2015-10-06 LAB — POCT INR: INR: 2.4

## 2015-10-06 NOTE — Telephone Encounter (Signed)
PT: 29.2 INR 2.4 Please call [name] and patient with recommendations.

## 2015-10-06 NOTE — Telephone Encounter (Signed)
INR is therapeutic.  Continue current warfarin dose of 2mg  qd except 1mg  on sundays.  Discussed with [name] with [facility].  This was her last visit to see Candace [name] so will need to schedule appt next in office for protime check. Patient called and appt made for 10/20/2015

## 2015-10-07 ENCOUNTER — Ambulatory Visit (INDEPENDENT_AMBULATORY_CARE_PROVIDER_SITE_OTHER): Payer: Medicare Other | Admitting: Physician Assistant

## 2015-10-07 ENCOUNTER — Encounter: Payer: Self-pay | Admitting: Physician Assistant

## 2015-10-07 VITALS — BP 134/68 | HR 47 | Temp 98.4°F | Ht <= 58 in | Wt 90.8 lb

## 2015-10-07 DIAGNOSIS — R600 Localized edema: Secondary | ICD-10-CM | POA: Diagnosis not present

## 2015-10-07 DIAGNOSIS — N183 Chronic kidney disease, stage 3 unspecified: Secondary | ICD-10-CM

## 2015-10-07 DIAGNOSIS — S51012A Laceration without foreign body of left elbow, initial encounter: Secondary | ICD-10-CM

## 2015-10-07 MED ORDER — MUPIROCIN CALCIUM 2 % EX CREA: 1.0000 "application " | TOPICAL_CREAM | Freq: Two times a day (BID) | CUTANEOUS | 0 refills | Status: DC

## 2015-10-07 NOTE — Progress Notes (Signed)
BP 134/68   Pulse (!) 47   Temp 98.4 F (36.9 C) (Oral)   Ht 4\' 8"  (1.422 m)   Wt 90 lb 12.8 oz (41.2 kg)   BMI 20.36 kg/m    Subjective:    Patient ID: Candace [name], female    DOB: 08/19/1922, 82 y.o.   MRN: [id number]  HPI: Candace [name] is a 82 y.o. female presenting on 10/07/2015 for Foot Swelling (Bilateral ) and sore on left leg  States she scraped her posterior left leg on a brick and got a skin tear.  Denies pus or fever. States it has drained yellow serous fluid.  Edema in the lower legs is chronic and therefore her skin is thin and friable.  While in the room she was sitting with her legs crossed at the knees, appears to be a habitual position.   Relevant past medical, surgical, family and social history reviewed and updated as indicated. Interim medical history since our last visit reviewed. Allergies and medications reviewed and updated. DATA REVIEWED: CHART IN EPIC  Social History   Social History  . Marital status: Widowed    Spouse name: N/A  . Number of children: 4  . Years of education: N/A   Occupational History  .  Retired   Social History Main Topics  . Smoking status: Never Smoker  . Smokeless tobacco: Current User    Types: Snuff  . Alcohol use No  . Drug use: No  . Sexual activity: Not Currently   Other Topics Concern  . Not on file   Social History Narrative   Lives alone.      Past Surgical History:  Procedure Laterality Date  . CATARACT EXTRACTION    . CHOLECYSTECTOMY    . KYPHOPLASTY    . SKIN CANCER EXCISION Left 2016   neck    Family History  Problem Relation Age of Onset  . Stroke Father   . Heart attack Mother   . Diabetes Brother   . Heart disease Brother   . Early death Sister     Review of Systems  Constitutional: Negative.  Negative for activity change, fatigue and fever.  HENT: Negative.   Eyes: Negative.   Respiratory: Negative.  Negative for cough and wheezing.   Cardiovascular: Positive for  palpitations and leg swelling. Negative for chest pain.  Gastrointestinal: Negative.  Negative for abdominal pain.  Endocrine: Negative.   Genitourinary: Negative.  Negative for dysuria.  Musculoskeletal: Negative.   Skin: Positive for color change and wound. Negative for pallor.  Neurological: Negative.       Medication List       Accurate as of 10/07/15  2:58 PM. Always use your most recent med list.          acetaminophen 500 MG tablet Commonly known as:  TYLENOL Take 1 tablet (500 mg total) by mouth every 8 (eight) hours as needed for mild pain.   amLODipine 2.5 MG tablet Commonly known as:  NORVASC Take 1 tablet (2.5 mg total) by mouth daily.   brimonidine 0.1 % Soln Commonly known as:  ALPHAGAN P Apply 1 drop to eye 2 (two) times daily.   calcitonin (salmon) 200 UNIT/ACT nasal spray Commonly known as:  MIACALCIN Place 1 spray into alternate nostrils daily.   denosumab 60 MG/ML Soln injection Commonly known as:  PROLIA INJECT 60 MG INTO THE SKIN EVERY 6 MONTHS. BRING TO OFFICE FOR ADMINSTRATION   furosemide 20 MG tablet Commonly known as:  LASIX Take one tablet daily   LUMIGAN 0.01 % Soln Generic drug:  bimatoprost INSTILL ONE DROP INTO EACH EYE AT BEDTIME   megestrol 400 MG/10ML suspension Commonly known as:  MEGACE Take 10 mLs (400 mg total) by mouth daily.   mupirocin cream 2 % Commonly known as:  BACTROBAN Apply 1 application topically 2 (two) times daily.   nabumetone 500 MG tablet Commonly known as:  RELAFEN Take 2 tablets (1,000 mg total) by mouth 2 (two) times daily. For severe back pain.   nitroGLYCERIN 0.4 MG SL tablet Commonly known as:  NITROSTAT Place 1 tablet (0.4 mg total) under the tongue every 5 (five) minutes as needed for chest pain.   simvastatin 40 MG tablet Commonly known as:  ZOCOR Take 1 tablet (40 mg total) by mouth at bedtime.   warfarin 2 MG tablet Commonly known as:  COUMADIN TAKE ONE OR TWO TABLETS BY MOUTH ONCE  DAILY AS DIRECTED BY ANTICOAGULATION          Objective:    BP 134/68   Pulse (!) 47   Temp 98.4 F (36.9 C) (Oral)   Ht 4\' 8"  (1.422 m)   Wt 90 lb 12.8 oz (41.2 kg)   BMI 20.36 kg/m   Allergies  Allergen Reactions  . Codeine Hives and Shortness Of Breath  . Fosamax [Alendronate Sodium] Hives and Nausea And Vomiting  . Penicillins Hives  . Sulfa Antibiotics Itching, Nausea And Vomiting and Rash  . Tramadol Other (See Comments)    Near syncope  . Boniva [Ibandronic Acid]   . Septra [Sulfamethoxazole-Trimethoprim]   . Vioxx [Rofecoxib]     Wt Readings from Last 3 Encounters:  10/07/15 90 lb 12.8 oz (41.2 kg)  10/05/15 92 lb 2 oz (41.8 kg)  09/28/15 96 lb (43.5 kg)    Physical Exam  Constitutional: She is oriented to person, place, and time. She appears well-developed and well-nourished.  HENT:  Head: Normocephalic and atraumatic.  Eyes: Conjunctivae and EOM are normal. Pupils are equal, round, and reactive to light.  Cardiovascular: Normal rate, regular rhythm, normal heart sounds and intact distal pulses.   Pulmonary/Chest: Effort normal and breath sounds normal.  Abdominal: Soft. Bowel sounds are normal.  Neurological: She is alert and oriented to person, place, and time. She has normal reflexes.  Skin: Skin is warm and dry. No rash noted.  Psychiatric: She has a normal mood and affect. Her behavior is normal. Judgment and thought content normal.  Nursing note and vitals reviewed.   Results for orders placed or performed in visit on 10/06/15  POCT INR  Result Value Ref Range   INR 2.4       Assessment & Plan:   1. Localized edema Take Lasix 2 tablets QAM for 2 days then reduce to one QD. ELEVATE legs, wear compression stockings and sit with legs UNCROSSED.  2. CKD (chronic kidney disease) stage 3, GFR 30-59 ml/min Continue regular meds  3. Skin tear of elbow without complication, left, initial encounter Continue to keep clean and dry, okay to use patch  as barrier - mupirocin cream (BACTROBAN) 2 %; Apply 1 application topically 2 (two) times daily.  Dispense: 15 g; Refill: 0   Continue all other maintenance medications as listed above.  Follow up plan: PRN worsening or non-improving  No orders of the defined types were placed in this encounter.   Educational handout given for skin tear  Terald Sleeper PA-C [place] [address]  [place], [place] [address] [phone]   10/07/2015, 2:58 PM

## 2015-10-07 NOTE — Patient Instructions (Signed)
Skin Tear Care  A skin tear is a wound in which the top layer of skin has peeled off. This is a common problem with aging because the skin becomes thinner and more fragile as a person gets older. In addition, some medicines, such as oral corticosteroids, can lead to skin thinning if taken for long periods of time.   A skin tear is often repaired with tape or skin adhesive strips. This keeps the skin that has been peeled off in contact with the healthier skin beneath. Depending on the location of the wound, a bandage (dressing) may be applied over the tape or skin adhesive strips. Sometimes, during the healing process, the skin turns black and dies. Even when this happens, the torn skin acts as a good dressing until the skin underneath gets healthier and repairs itself.  HOME CARE INSTRUCTIONS    Change dressings once per day or as directed by your caregiver.   Gently clean the skin tear and the area around the tear using saline solution or mild soap and water.   Do not rub the injured skin dry. Let the area air dry.   Apply petroleum jelly or an antibiotic cream or ointment to keep the tear moist. This will help the wound heal. Do not allow a scab to form.   If the dressing sticks before the next dressing change, moisten it with warm soapy water and gently remove it.   Protect the injured skin until it has healed.   Only take over-the-counter or prescription medicines as directed by your caregiver.   Take showers or baths using warm soapy water. Apply a new dressing after the shower or bath.   Keep all follow-up appointments as directed by your caregiver.   SEEK IMMEDIATE MEDICAL CARE IF:    You have redness, swelling, or increasing pain in the skin tear.   You havepus coming from the skin tear.   You have chills.   You have a red streak that goes away from the skin tear.   You have a bad smell coming from the tear or dressing.   You have a fever or persistent symptoms for more than 2-3 days.   You  have a fever and your symptoms suddenly get worse.  MAKE SURE YOU:   Understand these instructions.   Will watch this condition.   Will get help right away if your child is not doing well or gets worse.     This information is not intended to replace advice given to you by your health care provider. Make sure you discuss any questions you have with your health care provider.     Document Released: 09/19/2000 Document Revised: 09/19/2011 Document Reviewed: 07/09/2011  Elsevier Interactive Patient Education 2016 [employer]vier Inc[employer]

## 2015-10-08 ENCOUNTER — Telehealth: Payer: Self-pay | Admitting: Physician Assistant

## 2015-10-08 ENCOUNTER — Encounter: Payer: Self-pay | Admitting: Family Medicine

## 2015-10-08 ENCOUNTER — Ambulatory Visit (INDEPENDENT_AMBULATORY_CARE_PROVIDER_SITE_OTHER): Payer: Medicare Other | Admitting: Family Medicine

## 2015-10-08 DIAGNOSIS — I110 Hypertensive heart disease with heart failure: Secondary | ICD-10-CM | POA: Diagnosis not present

## 2015-10-08 DIAGNOSIS — I5030 Unspecified diastolic (congestive) heart failure: Secondary | ICD-10-CM | POA: Diagnosis not present

## 2015-10-08 DIAGNOSIS — I4891 Unspecified atrial fibrillation: Secondary | ICD-10-CM | POA: Diagnosis not present

## 2015-10-08 DIAGNOSIS — N189 Chronic kidney disease, unspecified: Secondary | ICD-10-CM | POA: Diagnosis not present

## 2015-10-08 DIAGNOSIS — M546 Pain in thoracic spine: Secondary | ICD-10-CM | POA: Diagnosis not present

## 2015-10-08 NOTE — Telephone Encounter (Signed)
Pt needs to use antibiotic cream. If does not improve will need to be seen.

## 2015-10-10 ENCOUNTER — Telehealth: Payer: Self-pay | Admitting: Nurse Practitioner

## 2015-10-10 NOTE — Telephone Encounter (Signed)
Patients daughter aware of recommendation.

## 2015-10-10 NOTE — Telephone Encounter (Signed)
Pt called to request MRI results.  

## 2015-10-11 ENCOUNTER — Encounter: Payer: Self-pay | Admitting: Family Medicine

## 2015-10-11 ENCOUNTER — Ambulatory Visit (INDEPENDENT_AMBULATORY_CARE_PROVIDER_SITE_OTHER): Payer: Medicare Other | Admitting: Family Medicine

## 2015-10-11 VITALS — BP 138/78 | HR 56 | Temp 96.9°F | Ht <= 58 in | Wt 91.1 lb

## 2015-10-11 DIAGNOSIS — L089 Local infection of the skin and subcutaneous tissue, unspecified: Secondary | ICD-10-CM | POA: Diagnosis not present

## 2015-10-11 DIAGNOSIS — L03116 Cellulitis of left lower limb: Secondary | ICD-10-CM

## 2015-10-11 DIAGNOSIS — S80922D Unspecified superficial injury of left lower leg, subsequent encounter: Secondary | ICD-10-CM

## 2015-10-11 DIAGNOSIS — T148XXA Other injury of unspecified body region, initial encounter: Secondary | ICD-10-CM

## 2015-10-11 MED ORDER — MINOCYCLINE HCL 100 MG PO CAPS: 100.0000 mg | ORAL_CAPSULE | Freq: Two times a day (BID) | ORAL | 0 refills | Status: DC

## 2015-10-11 NOTE — Telephone Encounter (Signed)
Results received and given to provider.

## 2015-10-11 NOTE — Progress Notes (Signed)
Subjective:  Patient ID: Candace [name], female    DOB: [birth year]  Age: 82 y.o. MRN: [id number]  CC: Open Wound (pt here today after scraping her leg on bricks and it won't heal. It has been on the lower left calf for about a week.)   HPI Candace [name] presents for recheck of left leg wound. It has become more sore, swollen since last eval. Bactroban not helping.  History Candace [name] has a past medical history of Aortic stenosis; Atrial fibrillation ([place]); DVT (deep venous thrombosis) ([place]); Essential hypertension; Glaucoma; History of skin cancer (2016); Hypercholesterolemia; and PE (pulmonary embolism).   She has a past surgical history that includes Cholecystectomy; Kyphoplasty; Skin cancer excision (Left, 2016); and Cataract extraction.   Her family history includes Diabetes in her brother; Early death in her sister; Heart attack in her mother; Heart disease in her brother; Stroke in her father.She reports that she has never smoked. Her smokeless tobacco use includes Snuff. She reports that she does not drink alcohol or use drugs.  Current Outpatient Prescriptions on File Prior to Visit  Medication Sig Dispense Refill  . acetaminophen (TYLENOL) 500 MG tablet Take 1 tablet (500 mg total) by mouth every 8 (eight) hours as needed for mild pain.    Marland Kitchen amLODipine (NORVASC) 2.5 MG tablet Take 1 tablet (2.5 mg total) by mouth daily. 30 tablet 11  . brimonidine (ALPHAGAN P) 0.1 % SOLN Apply 1 drop to eye 2 (two) times daily.     . calcitonin, salmon, (MIACALCIN) 200 UNIT/ACT nasal spray Place 1 spray into alternate nostrils daily. 3.7 mL 12  . denosumab (PROLIA) 60 MG/ML SOLN injection INJECT 60 MG INTO THE SKIN EVERY 6 MONTHS. BRING TO OFFICE FOR ADMINSTRATION 1 each 0  . furosemide (LASIX) 20 MG tablet Take one tablet daily (Patient taking differently: Take 20 mg by mouth daily. Take one tablet daily) 90 tablet 1  . LUMIGAN 0.01 % SOLN INSTILL ONE DROP INTO EACH EYE AT BEDTIME 3 Bottle 0  .  megestrol (MEGACE) 400 MG/10ML suspension Take 10 mLs (400 mg total) by mouth daily. 240 mL 0  . mupirocin cream (BACTROBAN) 2 % Apply 1 application topically 2 (two) times daily. 15 g 0  . nabumetone (RELAFEN) 500 MG tablet Take 2 tablets (1,000 mg total) by mouth 2 (two) times daily. For severe back pain. 120 tablet 2  . nitroGLYCERIN (NITROSTAT) 0.4 MG SL tablet Place 1 tablet (0.4 mg total) under the tongue every 5 (five) minutes as needed for chest pain. 25 tablet 3  . simvastatin (ZOCOR) 40 MG tablet Take 1 tablet (40 mg total) by mouth at bedtime. 90 tablet 1  . warfarin (COUMADIN) 2 MG tablet TAKE ONE OR TWO TABLETS BY MOUTH ONCE DAILY AS DIRECTED BY ANTICOAGULATION 90 tablet 1   No current facility-administered medications on file prior to visit.     ROS Review of Systems  Constitutional: Negative for activity change, appetite change and fever.  HENT: Negative for congestion.   Respiratory: Negative for cough.   Cardiovascular: Positive for leg swelling (left - chronic, uses fluid pill). Negative for chest pain and palpitations.  Gastrointestinal: Negative for abdominal pain, diarrhea and nausea.  Musculoskeletal: Negative for arthralgias and myalgias.    Objective:  BP 138/78   Pulse (!) 56   Temp (!) 96.9 F (36.1 C) (Oral)   Ht 4\' 8"  (1.422 m)   Wt 91 lb 2 oz (41.3 kg)   BMI 20.43 kg/m  Physical Exam  Musculoskeletal: Normal range of motion. She exhibits edema (2+ L  leg) and tenderness.  Skin: Skin is warm and dry.  1 x 3 cm skin tear with surrounding erythema. Tender to touch.    Assessment & Plan:   Candace [name] was seen today for open wound.  Diagnoses and all orders for this visit:  Cellulitis of leg, left  Infected skin tear  Other orders -     minocycline (MINOCIN) 100 MG capsule; Take 1 capsule (100 mg total) by mouth 2 (two) times daily. Take on an empty stomach   I am having Candace [name] start on minocycline. I am also having her maintain her  brimonidine, simvastatin, furosemide, acetaminophen, nitroGLYCERIN, warfarin, denosumab, megestrol, calcitonin (salmon), nabumetone, amLODipine, LUMIGAN, and mupirocin cream.  Meds ordered this encounter  Medications  . minocycline (MINOCIN) 100 MG capsule    Sig: Take 1 capsule (100 mg total) by mouth 2 (two) times daily. Take on an empty stomach    Dispense:  20 capsule    Refill:  0   Mupirocin and gauze dressing applied  Follow-up: Return in about 2 days (around 10/13/2015) for Wound check.  Claretta Fraise, M.D.

## 2015-10-11 NOTE — Patient Instructions (Addendum)
Washed gently with warm soapy water daily. Then rinse gently. Pat dry.  Apply mupirocin as previously prescribed.  Cover with gauze dressing.  Take your antibiotic twice daily  ELevate the leg to help with swelling.

## 2015-10-12 ENCOUNTER — Telehealth: Payer: Self-pay

## 2015-10-12 NOTE — Telephone Encounter (Signed)
Called wanting to know lumbar MRI results. Patient went to [facility] and got this done. Danelle Earthly who is on patients HIPPA would like a call if we have not gotten results back and she will have them re faxed because she works for [phone]. Please advise and send back to the pools.

## 2015-10-14 ENCOUNTER — Ambulatory Visit (INDEPENDENT_AMBULATORY_CARE_PROVIDER_SITE_OTHER): Payer: Medicare Other | Admitting: Family Medicine

## 2015-10-14 ENCOUNTER — Encounter: Payer: Self-pay | Admitting: Family Medicine

## 2015-10-14 VITALS — BP 191/80 | HR 49 | Temp 98.0°F | Ht <= 58 in | Wt 90.0 lb

## 2015-10-14 DIAGNOSIS — L97221 Non-pressure chronic ulcer of left calf limited to breakdown of skin: Secondary | ICD-10-CM

## 2015-10-14 DIAGNOSIS — I872 Venous insufficiency (chronic) (peripheral): Secondary | ICD-10-CM

## 2015-10-14 NOTE — Progress Notes (Signed)
BP (!) 200/84   Pulse (!) 49   Temp 98 F (36.7 C) (Oral)   Ht 4\' 8"  (1.422 m)   Wt 90 lb (40.8 kg)   BMI 20.18 kg/m    Subjective:    Patient ID: Candace [name], female    DOB: [birth year], 82 y.o.   MRN: [id number]  HPI: Candace [name] is a 82 y.o. female presenting on 10/14/2015 for Venous Stasis Ulcer (pt here today following up due to a wound on her left lower leg that isn't healing)   HPI Ulcer Patient comes in with the worsening venous stasis ulcer and increased tenderness on the back of her left calf. There is no drainage or erythema or warmth but a lot of tenderness associated with it and she feels like it is gotten larger. Over the past week she has been placing topical antibiotic twice a day and washing it with soap and water 3 times a day. She denies any fevers or chills. She does have 2+ pitting edema bilateral lower extremities  Relevant past medical, surgical, family and social history reviewed and updated as indicated. Interim medical history since our last visit reviewed. Allergies and medications reviewed and updated.  Review of Systems  Constitutional: Negative for chills and fever.  HENT: Negative for congestion, ear discharge and ear pain.   Eyes: Negative for redness and visual disturbance.  Respiratory: Negative for chest tightness and shortness of breath.   Cardiovascular: Negative for chest pain and leg swelling.  Genitourinary: Negative for difficulty urinating and dysuria.  Musculoskeletal: Negative for back pain and gait problem.  Skin: Positive for wound. Negative for color change and rash.  Neurological: Negative for light-headedness and headaches.  Psychiatric/Behavioral: Negative for agitation and behavioral problems.  All other systems reviewed and are negative.   Per HPI unless specifically indicated above     Medication List       Accurate as of 10/14/15  2:34 PM. Always use your most recent med list.          acetaminophen 500  MG tablet Commonly known as:  TYLENOL Take 1 tablet (500 mg total) by mouth every 8 (eight) hours as needed for mild pain.   amLODipine 2.5 MG tablet Commonly known as:  NORVASC Take 1 tablet (2.5 mg total) by mouth daily.   brimonidine 0.1 % Soln Commonly known as:  ALPHAGAN P Apply 1 drop to eye 2 (two) times daily.   calcitonin (salmon) 200 UNIT/ACT nasal spray Commonly known as:  MIACALCIN Place 1 spray into alternate nostrils daily.   denosumab 60 MG/ML Soln injection Commonly known as:  PROLIA INJECT 60 MG INTO THE SKIN EVERY 6 MONTHS. BRING TO OFFICE FOR ADMINSTRATION   furosemide 20 MG tablet Commonly known as:  LASIX Take one tablet daily   LUMIGAN 0.01 % Soln Generic drug:  bimatoprost INSTILL ONE DROP INTO EACH EYE AT BEDTIME   megestrol 400 MG/10ML suspension Commonly known as:  MEGACE Take 10 mLs (400 mg total) by mouth daily.   minocycline 100 MG capsule Commonly known as:  MINOCIN Take 1 capsule (100 mg total) by mouth 2 (two) times daily. Take on an empty stomach   mupirocin cream 2 % Commonly known as:  BACTROBAN Apply 1 application topically 2 (two) times daily.   nabumetone 500 MG tablet Commonly known as:  RELAFEN Take 2 tablets (1,000 mg total) by mouth 2 (two) times daily. For severe back pain.   nitroGLYCERIN 0.4 MG SL tablet  Commonly known as:  NITROSTAT Place 1 tablet (0.4 mg total) under the tongue every 5 (five) minutes as needed for chest pain.   simvastatin 40 MG tablet Commonly known as:  ZOCOR Take 1 tablet (40 mg total) by mouth at bedtime.   warfarin 2 MG tablet Commonly known as:  COUMADIN TAKE ONE OR TWO TABLETS BY MOUTH ONCE DAILY AS DIRECTED BY ANTICOAGULATION          Objective:    BP (!) 200/84   Pulse (!) 49   Temp 98 F (36.7 C) (Oral)   Ht 4\' 8"  (1.422 m)   Wt 90 lb (40.8 kg)   BMI 20.18 kg/m   Wt Readings from Last 3 Encounters:  10/14/15 90 lb (40.8 kg)  10/11/15 91 lb 2 oz (41.3 kg)  10/07/15 90 lb  12.8 oz (41.2 kg)    Physical Exam  Constitutional: She is oriented to person, place, and time. She appears well-developed and well-nourished. No distress.  Eyes: Conjunctivae are normal.  Cardiovascular: Normal rate, regular rhythm, normal heart sounds and intact distal pulses.   No murmur heard. Pulmonary/Chest: Effort normal and breath sounds normal. No respiratory distress. She has no wheezes.  Musculoskeletal: Normal range of motion. She exhibits no edema or tenderness.  Neurological: She is alert and oriented to person, place, and time. Coordination normal.  Skin: Skin is warm and dry. Lesion (Stage II ulceration of the venous stasis type on the back of her left calf about 2 x 3 cm in size. Black dried base.) noted. No rash noted. She is not diaphoretic.  Psychiatric: She has a normal mood and affect. Her behavior is normal.  Nursing note and vitals reviewed.  Unna boot placement: Nurse to place an Haematologist with calamine, patient understands that she is to leave this on all week and we will taken off next week when she comes.    Assessment & Plan:   Problem List Items Addressed This Visit    None    Visit Diagnoses    Venous stasis ulcer of left calf limited to breakdown of skin without varicose veins (HCC)    -  Primary       Follow up plan: Return in about 1 week (around 10/21/2015), or if symptoms worsen or fail to improve, for Recheck wound.  Counseling provided for all of the vaccine components No orders of the defined types were placed in this encounter.   Caryl Pina, MD [facility] 10/14/2015, 2:34 PM

## 2015-10-17 ENCOUNTER — Encounter: Payer: Self-pay | Admitting: Family Medicine

## 2015-10-17 ENCOUNTER — Ambulatory Visit (INDEPENDENT_AMBULATORY_CARE_PROVIDER_SITE_OTHER): Payer: Medicare Other | Admitting: Family Medicine

## 2015-10-17 DIAGNOSIS — L97221 Non-pressure chronic ulcer of left calf limited to breakdown of skin: Secondary | ICD-10-CM

## 2015-10-17 DIAGNOSIS — I5042 Chronic combined systolic (congestive) and diastolic (congestive) heart failure: Secondary | ICD-10-CM | POA: Diagnosis not present

## 2015-10-17 DIAGNOSIS — I878 Other specified disorders of veins: Secondary | ICD-10-CM | POA: Diagnosis not present

## 2015-10-17 DIAGNOSIS — I872 Venous insufficiency (chronic) (peripheral): Secondary | ICD-10-CM

## 2015-10-17 NOTE — Progress Notes (Signed)
Pt came in to have unna boot changed due to irritation from dressing. L leg redressed with unna boot RX for hospital bed given   Patient's ulceration is still the same size and has the same base that is still mostly dry. No surrounding erythema or warmth.   Problem List Items Addressed This Visit    None    Visit Diagnoses    Venous stasis ulcer of left calf limited to breakdown of skin without varicose veins (HCC)    -  Primary   Chronic combined systolic and diastolic congestive heart failure ([place])       Patient wants a hospital bed so she can have her legs more elevated, will give her prescription for such   Relevant Orders   For home use only DME Hospital bed   Venous stasis       Relevant Orders   For home use only DME Hospital bed      So patient briefly and agree with plan, no changes to wound at this point because it's only been a few days. Continue current treatment and see back in one week.

## 2015-10-19 ENCOUNTER — Ambulatory Visit (INDEPENDENT_AMBULATORY_CARE_PROVIDER_SITE_OTHER): Payer: Medicare Other | Admitting: Physician Assistant

## 2015-10-19 ENCOUNTER — Encounter: Payer: Self-pay | Admitting: Physician Assistant

## 2015-10-19 VITALS — BP 160/92 | HR 56 | Temp 96.7°F | Wt 90.0 lb

## 2015-10-19 DIAGNOSIS — I82401 Acute embolism and thrombosis of unspecified deep veins of right lower extremity: Secondary | ICD-10-CM | POA: Diagnosis not present

## 2015-10-19 DIAGNOSIS — Z7901 Long term (current) use of anticoagulants: Secondary | ICD-10-CM | POA: Diagnosis not present

## 2015-10-19 DIAGNOSIS — I4891 Unspecified atrial fibrillation: Secondary | ICD-10-CM

## 2015-10-19 DIAGNOSIS — L97929 Non-pressure chronic ulcer of unspecified part of left lower leg with unspecified severity: Principal | ICD-10-CM

## 2015-10-19 DIAGNOSIS — I83029 Varicose veins of left lower extremity with ulcer of unspecified site: Secondary | ICD-10-CM

## 2015-10-19 LAB — COAGUCHEK XS/INR WAIVED
INR: 2.6 — AB (ref 0.9–1.1)
PROTHROMBIN TIME: 31.5 s

## 2015-10-19 NOTE — Patient Instructions (Signed)
Venous Ulcer A venous ulcer is a shallow sore on your lower leg that is caused by poor circulation in your veins. This condition used to be called stasis ulcer.  Veins have valves that help return blood to the heart. If these valves do not work properly, it can cause blood to flow backward and to back up into the veins near the skin. When that happens, blood can pool in your lower legs. The blood can then leak out of your veins, which can irritate your skin. This may cause a break in your skin that becomes a venous ulcer. Venous ulcer is the most common type of lower leg ulcer. You may have venous ulcers on one leg or on both legs. The area where this condition most commonly develops is around the ankles. A venous ulcer may last for a long time (chronic ulcer) or it may return repeatedly (recurrent ulcer). CAUSES Any condition that causes poor circulation to your legs can lead to a venous ulcer.  RISK FACTORS This condition is more likely to develop in:  People who are 65 years of age or older.  People who are overweight.  People who are not active.  People who have had a leg ulcer in the past.  People who have clots in their lower leg veins (deep vein thrombosis).  People who have inflammation of their leg veins (phlebitis).  Women who have given birth.  People who smoke. SYMPTOMS  The most common symptom of this condition is an open sore near your ankle. Other symptoms may include:  Swelling.  Thickening of the skin.  Fluid leaking from the ulcer.  Bleeding.  Itching.  Pain and swelling that gets worse when you stand up and feels better when you raise your leg.  Blotchy skin.  Darkening of the skin. DIAGNOSIS  Your health care provider may suspect a venous ulcer based on your medical history and your risk factors. Your health care provider will check the skin on your legs. Other tests may be done to learn more about the ulcer and to determine the best way to treat it.  Tests that may be done include:  Measuring the blood pressure in your arms and legs.  Using sound waves (ultrasound) to measure the blood flow in your leg veins. TREATMENT You may need to try several different types of treatment to get your venous ulcer to heal. Healing may take a long time. Treatment may include:  Keeping your leg raised (elevated).  Wearing a type of bandage or stocking to compress the veins of your leg (compression therapy). Venous wounds are not likely to heal or to stay healed without compression.  Taking medicines to improve blood flow.  Taking antibiotic medicines to treat infection.  Cleaning your ulcer and removing any dead tissue from the wound (debridement).  Placing various types of medicated bandage (dressings) or medicated wraps on your ulcer. This helps the ulcer to heal and helps to prevent infection. Surgery is sometimes needed to close the wound using a piece of skin taken from another area of your body (graft). You may need surgery if other treatments are not working or if your ulcer is very deep. HOME CARE INSTRUCTIONS Wound Care  Follow instructions from your health care provider about:  How to take care of your wound.  When and how you should change your bandage (dressing).  When you should remove your dressing. If your dressing is dry and sticks to your leg when you try to remove   it, moisten or wet the dressing with saline solution or water so that the dressing can be removed without harming your skin or wound tissue.  Check your wound every day for signs of infection. Have a caregiver do this for you if you are not able to do it yourself. Check for:  More redness, swelling, or pain. More fluid or blood.  Pus, warmth, or a bad smell. Medicines   Take over-the-counter and prescription medicines only as told by your health care provider.  If you were prescribed an antibiotic medicine, take it or apply it as told by your health care  provider. Do not stop taking or using the antibiotic even if your condition improves. Activity  Do not stand or sit in one position for a long period of time. Rest with your legs raised during the day. If possible, keep your legs above your heart for 30 minutes, 3-4 times a day, or as told by your health care provider.  Do not sit with your legs crossed.   Walk often to increase the blood flow in your legs.Ask your health care provider what level of activity is safe for you.  If you are taking a long ride in a car or plane, take a break to walk around at least once every two hours, or as often as your health care provider recommends. Ask your health care provider if you should take aspirin before long trips.  General Instructions  Wear elastic stockings, compression stockings, or support hose as told by your health care provider. This is very important.  Raise the foot of your bed as told by your health care provider.  Do not smoke.  Keep all follow-up visits as told by your health care provider. This is important. SEEK MEDICAL CARE IF:   You have a fever.   Your ulcer is getting larger or is not healing.   Your pain gets worse.   You have more redness or swelling around your ulcer.  You have more fluid, blood, or pus coming from your ulcer after it has been cleaned by you or your health care provider.  You have warmth or a bad smell coming from your ulcer.   This information is not intended to replace advice given to you by your health care provider. Make sure you discuss any questions you have with your health care provider.   Document Released: 09/19/2000 Document Revised: 09/15/2014 Document Reviewed: 05/05/2014 Elsevier Interactive Patient Education 2016 [employer] Inc.  

## 2015-10-20 ENCOUNTER — Telehealth: Payer: Self-pay | Admitting: Nurse Practitioner

## 2015-10-20 ENCOUNTER — Encounter: Payer: Self-pay | Admitting: Pharmacist

## 2015-10-21 ENCOUNTER — Ambulatory Visit: Payer: Medicare Other | Admitting: Pediatrics

## 2015-10-24 ENCOUNTER — Ambulatory Visit: Payer: Medicare Other | Admitting: Pediatrics

## 2015-10-24 ENCOUNTER — Ambulatory Visit (HOSPITAL_COMMUNITY): Payer: Medicare Other | Attending: Physician Assistant | Admitting: Physical Therapy

## 2015-10-24 ENCOUNTER — Inpatient Hospital Stay (HOSPITAL_COMMUNITY)
Admission: EM | Admit: 2015-10-24 | Discharge: 2015-10-25 | DRG: 603 | Discharge status: Against Medical Advice | Payer: Medicare Other | Attending: Family Medicine | Admitting: Family Medicine

## 2015-10-24 ENCOUNTER — Telehealth (HOSPITAL_COMMUNITY): Payer: Self-pay | Admitting: Physical Therapy

## 2015-10-24 ENCOUNTER — Emergency Department (HOSPITAL_COMMUNITY): Payer: Medicare Other

## 2015-10-24 ENCOUNTER — Other Ambulatory Visit: Payer: Self-pay | Admitting: Nurse Practitioner

## 2015-10-24 ENCOUNTER — Encounter (HOSPITAL_COMMUNITY): Payer: Self-pay | Admitting: Emergency Medicine

## 2015-10-24 DIAGNOSIS — L03115 Cellulitis of right lower limb: Principal | ICD-10-CM | POA: Diagnosis present

## 2015-10-24 DIAGNOSIS — R262 Difficulty in walking, not elsewhere classified: Secondary | ICD-10-CM | POA: Insufficient documentation

## 2015-10-24 DIAGNOSIS — I350 Nonrheumatic aortic (valve) stenosis: Secondary | ICD-10-CM

## 2015-10-24 DIAGNOSIS — Z8249 Family history of ischemic heart disease and other diseases of the circulatory system: Secondary | ICD-10-CM | POA: Diagnosis not present

## 2015-10-24 DIAGNOSIS — Y999 Unspecified external cause status: Secondary | ICD-10-CM | POA: Diagnosis not present

## 2015-10-24 DIAGNOSIS — L089 Local infection of the skin and subcutaneous tissue, unspecified: Secondary | ICD-10-CM

## 2015-10-24 DIAGNOSIS — S80212A Abrasion, left knee, initial encounter: Secondary | ICD-10-CM | POA: Diagnosis not present

## 2015-10-24 DIAGNOSIS — Z7901 Long term (current) use of anticoagulants: Secondary | ICD-10-CM

## 2015-10-24 DIAGNOSIS — Z86718 Personal history of other venous thrombosis and embolism: Secondary | ICD-10-CM | POA: Diagnosis not present

## 2015-10-24 DIAGNOSIS — E876 Hypokalemia: Secondary | ICD-10-CM | POA: Diagnosis not present

## 2015-10-24 DIAGNOSIS — Z87891 Personal history of nicotine dependence: Secondary | ICD-10-CM

## 2015-10-24 DIAGNOSIS — R001 Bradycardia, unspecified: Secondary | ICD-10-CM | POA: Diagnosis present

## 2015-10-24 DIAGNOSIS — I495 Sick sinus syndrome: Secondary | ICD-10-CM | POA: Diagnosis present

## 2015-10-24 DIAGNOSIS — Z66 Do not resuscitate: Secondary | ICD-10-CM | POA: Diagnosis present

## 2015-10-24 DIAGNOSIS — I10 Essential (primary) hypertension: Secondary | ICD-10-CM

## 2015-10-24 DIAGNOSIS — T148XXA Other injury of unspecified body region, initial encounter: Secondary | ICD-10-CM

## 2015-10-24 DIAGNOSIS — Z85828 Personal history of other malignant neoplasm of skin: Secondary | ICD-10-CM | POA: Diagnosis not present

## 2015-10-24 DIAGNOSIS — W010XXA Fall on same level from slipping, tripping and stumbling without subsequent striking against object, initial encounter: Secondary | ICD-10-CM | POA: Diagnosis not present

## 2015-10-24 DIAGNOSIS — M79662 Pain in left lower leg: Secondary | ICD-10-CM | POA: Diagnosis not present

## 2015-10-24 DIAGNOSIS — I129 Hypertensive chronic kidney disease with stage 1 through stage 4 chronic kidney disease, or unspecified chronic kidney disease: Secondary | ICD-10-CM | POA: Diagnosis present

## 2015-10-24 DIAGNOSIS — M7989 Other specified soft tissue disorders: Secondary | ICD-10-CM | POA: Diagnosis not present

## 2015-10-24 DIAGNOSIS — M79661 Pain in right lower leg: Secondary | ICD-10-CM | POA: Insufficient documentation

## 2015-10-24 DIAGNOSIS — L97223 Non-pressure chronic ulcer of left calf with necrosis of muscle: Secondary | ICD-10-CM | POA: Diagnosis not present

## 2015-10-24 DIAGNOSIS — Z79899 Other long term (current) drug therapy: Secondary | ICD-10-CM | POA: Diagnosis not present

## 2015-10-24 DIAGNOSIS — S91141D Puncture wound with foreign body of right great toe without damage to nail, subsequent encounter: Secondary | ICD-10-CM | POA: Diagnosis not present

## 2015-10-24 DIAGNOSIS — N183 Chronic kidney disease, stage 3 unspecified: Secondary | ICD-10-CM | POA: Diagnosis present

## 2015-10-24 DIAGNOSIS — S80211A Abrasion, right knee, initial encounter: Secondary | ICD-10-CM | POA: Diagnosis not present

## 2015-10-24 DIAGNOSIS — F17290 Nicotine dependence, other tobacco product, uncomplicated: Secondary | ICD-10-CM | POA: Diagnosis not present

## 2015-10-24 DIAGNOSIS — I4891 Unspecified atrial fibrillation: Secondary | ICD-10-CM | POA: Diagnosis present

## 2015-10-24 DIAGNOSIS — Y9389 Activity, other specified: Secondary | ICD-10-CM | POA: Diagnosis not present

## 2015-10-24 DIAGNOSIS — Y929 Unspecified place or not applicable: Secondary | ICD-10-CM | POA: Diagnosis not present

## 2015-10-24 LAB — COMPREHENSIVE METABOLIC PANEL
ALK PHOS: 79 U/L (ref 38–126)
ALT: 19 U/L (ref 14–54)
ANION GAP: 10 (ref 5–15)
AST: 32 U/L (ref 15–41)
Albumin: 3.2 g/dL — ABNORMAL LOW (ref 3.5–5.0)
BUN: 27 mg/dL — ABNORMAL HIGH (ref 6–20)
CALCIUM: 8.4 mg/dL — AB (ref 8.9–10.3)
CO2: 28 mmol/L (ref 22–32)
CREATININE: 1.5 mg/dL — AB (ref 0.44–1.00)
Chloride: 98 mmol/L — ABNORMAL LOW (ref 101–111)
GFR, EST AFRICAN AMERICAN: 34 mL/min — AB (ref >60)
GFR, EST NON AFRICAN AMERICAN: 29 mL/min — AB (ref >60)
Glucose, Bld: 103 mg/dL — ABNORMAL HIGH (ref 65–99)
Potassium: 2.8 mmol/L — ABNORMAL LOW (ref 3.5–5.1)
Sodium: 136 mmol/L (ref 135–145)
TOTAL PROTEIN: 6 g/dL — AB (ref 6.5–8.1)
Total Bilirubin: 0.9 mg/dL (ref 0.3–1.2)

## 2015-10-24 LAB — CBC WITH DIFFERENTIAL/PLATELET
Basophils Absolute: 0 10*3/uL (ref 0.0–0.1)
Basophils Relative: 0 %
EOS ABS: 0 10*3/uL (ref 0.0–0.7)
EOS PCT: 0 %
HCT: 41.8 % (ref 36.0–46.0)
HEMOGLOBIN: 14 g/dL (ref 12.0–15.0)
LYMPHS ABS: 0.8 10*3/uL (ref 0.7–4.0)
LYMPHS PCT: 12 %
MCH: 28.7 pg (ref 26.0–34.0)
MCHC: 33.5 g/dL (ref 30.0–36.0)
MCV: 85.7 fL (ref 78.0–100.0)
MONOS PCT: 14 %
Monocytes Absolute: 0.9 10*3/uL (ref 0.1–1.0)
NEUTROS PCT: 74 %
Neutro Abs: 4.6 10*3/uL (ref 1.7–7.7)
Platelets: 152 10*3/uL (ref 150–400)
RBC: 4.88 MIL/uL (ref 3.87–5.11)
RDW: 13.7 % (ref 11.5–15.5)
WBC: 6.2 10*3/uL (ref 4.0–10.5)

## 2015-10-24 LAB — TROPONIN I: Troponin I: 0.05 ng/mL (ref <0.03)

## 2015-10-24 LAB — PROTIME-INR
INR: 2.83
PROTHROMBIN TIME: 30.3 s — AB (ref 11.4–15.2)

## 2015-10-24 LAB — MAGNESIUM: Magnesium: 1.7 mg/dL (ref 1.7–2.4)

## 2015-10-24 LAB — SEDIMENTATION RATE: SED RATE: 6 mm/h (ref 0–22)

## 2015-10-24 MED ORDER — AMLODIPINE BESYLATE 5 MG PO TABS: 2.5000 mg | ORAL_TABLET | Freq: Every day | ORAL | Status: DC

## 2015-10-24 MED ORDER — LATANOPROST 0.005 % OP SOLN
1.0000 [drp] | Freq: Every day | OPHTHALMIC | Status: DC
  Administered 2015-10-25: 1 [drp] via OPHTHALMIC
  Filled 2015-10-24: qty 2.5

## 2015-10-24 MED ORDER — WARFARIN - PHARMACIST DOSING INPATIENT: Status: DC

## 2015-10-24 MED ORDER — VANCOMYCIN HCL IN DEXTROSE 1-5 GM/200ML-% IV SOLN
1000.0000 mg | Freq: Once | INTRAVENOUS | Status: AC
  Administered 2015-10-24: 1000 mg via INTRAVENOUS
  Filled 2015-10-24: qty 200

## 2015-10-24 MED ORDER — MAGNESIUM SULFATE 2 GM/50ML IV SOLN: 2.0000 g | Freq: Once | INTRAVENOUS | Status: DC

## 2015-10-24 MED ORDER — ACETAMINOPHEN 650 MG RE SUPP: 650.0000 mg | Freq: Four times a day (QID) | RECTAL | Status: DC | PRN

## 2015-10-24 MED ORDER — POTASSIUM CHLORIDE 10 MEQ/100ML IV SOLN
10.0000 meq | INTRAVENOUS | Status: AC
  Administered 2015-10-24: 10 meq via INTRAVENOUS
  Filled 2015-10-24 (×2): qty 100

## 2015-10-24 MED ORDER — POLYETHYLENE GLYCOL 3350 17 G PO PACK: 17.0000 g | PACK | Freq: Every day | ORAL | Status: DC | PRN

## 2015-10-24 MED ORDER — ONDANSETRON HCL 4 MG PO TABS: 4.0000 mg | ORAL_TABLET | Freq: Four times a day (QID) | ORAL | Status: DC | PRN

## 2015-10-24 MED ORDER — SODIUM CHLORIDE 0.9 % IV SOLN
INTRAVENOUS | Status: DC
  Administered 2015-10-25: via INTRAVENOUS

## 2015-10-24 MED ORDER — ACETAMINOPHEN 325 MG PO TABS: 650.0000 mg | ORAL_TABLET | Freq: Four times a day (QID) | ORAL | Status: DC | PRN

## 2015-10-24 MED ORDER — BRIMONIDINE TARTRATE 0.15 % OP SOLN
1.0000 [drp] | Freq: Three times a day (TID) | OPHTHALMIC | Status: DC
  Administered 2015-10-25: 1 [drp] via OPHTHALMIC
  Filled 2015-10-24: qty 5

## 2015-10-24 MED ORDER — SIMVASTATIN 10 MG PO TABS
40.0000 mg | ORAL_TABLET | Freq: Every day | ORAL | Status: DC
  Filled 2015-10-24: qty 4

## 2015-10-24 MED ORDER — ONDANSETRON HCL 4 MG/2ML IJ SOLN: 4.0000 mg | Freq: Four times a day (QID) | INTRAMUSCULAR | Status: DC | PRN

## 2015-10-24 MED ORDER — POTASSIUM CHLORIDE CRYS ER 20 MEQ PO TBCR
40.0000 meq | EXTENDED_RELEASE_TABLET | Freq: Once | ORAL | Status: AC
  Administered 2015-10-24: 40 meq via ORAL
  Filled 2015-10-24: qty 2

## 2015-10-24 MED ORDER — SODIUM CHLORIDE 0.9% FLUSH: 3.0000 mL | Freq: Two times a day (BID) | INTRAVENOUS | Status: DC

## 2015-10-24 MED ORDER — ENOXAPARIN SODIUM 30 MG/0.3ML ~~LOC~~ SOLN: 30.0000 mg | SUBCUTANEOUS | Status: DC

## 2015-10-24 MED ORDER — SODIUM CHLORIDE 0.9 % IV BOLUS (SEPSIS)
250.0000 mL | Freq: Once | INTRAVENOUS | Status: AC
  Administered 2015-10-24: 250 mL via INTRAVENOUS

## 2015-10-24 NOTE — ED Provider Notes (Signed)
[facility] DEPT Provider Note   CSN: [id number] Arrival date & time: 10/24/15  1621     History   Chief Complaint Chief Complaint  Patient presents with  . Wound Check    HPI Candace [name] is a 82 y.o. female.  HPI Patient was at wound care for a chronic left calf wound when staff noticed a puncture wound to the plantar surface of the right great toe. Diffuse swelling with erythema extending to the dorsum of the foot. Patient unsure how long this has been present. She denies any fever or chills. Extensive emergency department for evaluation for possible osteomyelitis. Past Medical History:  Diagnosis Date  . Aortic stenosis   . Atrial fibrillation ([place])   . DVT (deep venous thrombosis) ([place])   . Essential hypertension   . Glaucoma   . History of skin cancer 2016  . Hypercholesterolemia   . PE (pulmonary embolism)     Patient Active Problem List   Diagnosis Date Noted  . Aortic atherosclerosis ([place]) 09/01/2015  . Sick sinus syndrome ([place]) 08/11/2015  . Non-sustained ventricular tachycardia ([place]) 08/11/2015  . Anticoagulated on Coumadin 08/11/2015  . CKD (chronic kidney disease) stage 3, GFR 30-59 ml/min 08/09/2015  . Elevated troponin 08/08/2015  . Renal insufficiency 08/08/2015  . Severe aortic stenosis 08/05/2015  . Hyperlipidemia 06/10/2015  . Tobacco use disorder 01/28/2014  . Osteoporosis with pathological fracture 08/13/2012  . DVT (deep venous thrombosis), right 05/13/2012  . HTN (hypertension) 05/13/2012  . Long term current use of anticoagulant therapy 05/13/2012  . Atrial fibrillation ([place]) 04/15/2012    Past Surgical History:  Procedure Laterality Date  . CATARACT EXTRACTION    . CHOLECYSTECTOMY    . KYPHOPLASTY    . SKIN CANCER EXCISION Left 2016   neck    OB History    Gravida Para Term Preterm AB Living             4   SAB TAB Ectopic Multiple Live Births                   Home Medications    Prior to Admission medications     Medication Sig Start Date End Date Taking? Authorizing Provider  acetaminophen (TYLENOL) 500 MG tablet Take 1 tablet (500 mg total) by mouth every 8 (eight) hours as needed for mild pain. 08/14/15   Samuella Cota, MD  amLODipine (NORVASC) 2.5 MG tablet Take 1 tablet (2.5 mg total) by mouth daily. 09/28/15   Minus Breeding, MD  brimonidine (ALPHAGAN P) 0.1 % SOLN Apply 1 drop to eye 2 (two) times daily.     Historical Provider, MD  calcitonin, salmon, (MIACALCIN) 200 UNIT/ACT nasal spray Place 1 spray into alternate nostrils daily. 09/26/15   Claretta Fraise, MD  denosumab (PROLIA) 60 MG/ML SOLN injection INJECT 60 MG INTO THE SKIN EVERY 6 MONTHS. BRING TO OFFICE FOR ADMINSTRATION 09/12/15   [name] Done, FNP  furosemide (LASIX) 20 MG tablet Take one tablet daily Patient taking differently: Take 20 mg by mouth daily. Take one tablet daily 07/28/15   [name] Done, FNP  LUMIGAN 0.01 % SOLN INSTILL ONE DROP INTO [facility] EYE AT BEDTIME 10/04/15   [name] Done, FNP  megestrol (MEGACE) 400 MG/10ML suspension Take 10 mLs (400 mg total) by mouth daily. 09/13/15   [name] Done, FNP  minocycline (MINOCIN) 100 MG capsule Take 1 capsule (100 mg total) by mouth 2 (two) times daily. Take on an empty stomach 10/11/15   Claretta Fraise,  MD  mupirocin cream (BACTROBAN) 2 % Apply 1 application topically 2 (two) times daily. 10/07/15   Terald Sleeper, PA-C  nabumetone (RELAFEN) 500 MG tablet Take 2 tablets (1,000 mg total) by mouth 2 (two) times daily. For severe back pain. 09/26/15   Claretta Fraise, MD  nitroGLYCERIN (NITROSTAT) 0.4 MG SL tablet Place 1 tablet (0.4 mg total) under the tongue every 5 (five) minutes as needed for chest pain. 08/18/15   Minus Breeding, MD  simvastatin (ZOCOR) 40 MG tablet Take 1 tablet (40 mg total) by mouth at bedtime. 06/10/15   [name] Done, FNP  warfarin (COUMADIN) 2 MG tablet TAKE ONE OR TWO TABLETS BY MOUTH ONCE DAILY AS DIRECTED BY ANTICOAGULATION 08/24/15    [name] Done, FNP    Family History Family History  Problem Relation Age of Onset  . Stroke Father   . Heart attack Mother   . Diabetes Brother   . Heart disease Brother   . Early death Sister     Social History Social History  Substance Use Topics  . Smoking status: Never Smoker  . Smokeless tobacco: Current User    Types: Snuff  . Alcohol use No     Allergies   Codeine; Fosamax [alendronate sodium]; Penicillins; Sulfa antibiotics; Tramadol; Boniva [ibandronic acid]; Septra [sulfamethoxazole-trimethoprim]; and Vioxx [rofecoxib]   Review of Systems Review of Systems  Constitutional: Positive for fatigue. Negative for chills and fever.  Respiratory: Negative for shortness of breath.   Cardiovascular: Negative for chest pain.  Gastrointestinal: Negative for abdominal pain, nausea and vomiting.  Musculoskeletal: Positive for arthralgias.  Skin: Positive for color change and wound. Negative for rash.  Neurological: Negative for dizziness, weakness, light-headedness, numbness and headaches.  All other systems reviewed and are negative.    Physical Exam Updated Vital Signs BP 177/78 (BP Location: Left Arm)   Pulse (!) 38   Temp 97.1 F (36.2 C) (Oral)   Resp 15   Ht 4\' 8"  (1.422 m)   Wt 90 lb (40.8 kg)   SpO2 100%   BMI 20.18 kg/m   Physical Exam  Constitutional: She is oriented to person, place, and time. She appears well-developed and well-nourished.  HENT:  Head: Normocephalic and atraumatic.  Mouth/Throat: Oropharynx is clear and moist.  Eyes: EOM are normal. Pupils are equal, round, and reactive to light.  Neck: Normal range of motion. Neck supple.  Cardiovascular: Regular rhythm.   Murmur heard. Bradycardia  Pulmonary/Chest: Effort normal and breath sounds normal.  Abdominal: Soft. Bowel sounds are normal. There is no tenderness. There is no rebound and no guarding.  Musculoskeletal: Normal range of motion. She exhibits edema and tenderness.    Patient has a puncture wound to the plantar surface of the right great toe. There is purulent discharge from the wound site. The wound appears to be quite deep. No foreign bodies appreciated. Patient has swelling and erythema extending to the dorsum of the right foot. She has 2+ pitting edema to the right ankle.  Neurological: She is alert and oriented to person, place, and time.  Moves all extremities without deficit. Sensation is fully intact.  Skin: Skin is warm and dry. Capillary refill takes less than 2 seconds. No rash noted. There is erythema.  Psychiatric: She has a normal mood and affect. Her behavior is normal.  Nursing note and vitals reviewed.    ED Treatments / Results  Labs (all labs ordered are listed, but only abnormal results are displayed) Labs Reviewed  AEROBIC CULTURE (SUPERFICIAL  SPECIMEN)  CBC WITH DIFFERENTIAL/PLATELET  COMPREHENSIVE METABOLIC PANEL  SEDIMENTATION RATE  C-REACTIVE PROTEIN    EKG  EKG Interpretation None       Radiology Dg Toe Great Right  Result Date: 10/24/2015 CLINICAL DATA:  Infection great toe EXAM: RIGHT GREAT TOE COMPARISON:  None. FINDINGS: Diffuse soft tissue swelling. No gas in the soft tissues. There is bandage overlying the great toe. Negative for osteomyelitis.  Negative for fracture. Diffuse arterial calcification. IMPRESSION: Diffuse soft tissue swelling of the great toe. Negative for osteomyelitis. Electronically Signed   By: Franchot Gallo M.D.   On: 10/24/2015 17:18    Procedures Procedures (including critical care time)  Medications Ordered in ED Medications  vancomycin (VANCOCIN) IVPB 1000 mg/200 mL premix (not administered)     Initial Impression / Assessment and Plan / ED Course  I have reviewed the triage vital signs and the nursing notes.  Pertinent labs & imaging results that were available during my care of the patient were reviewed by me and considered in my medical decision making (see chart for  details).  Clinical Course    Right great toe wound infection. X-ray without definite evidence of osteomyelitis. We'll get sedimentation rate and CRP. Overlying cellulitis has been marked. Will start IV antibiotics. Plan to admit to hospitalist for observation and repeat antibiotics. Possible MRI Patient noted to be hypokalemic. Started on potassium replacement. Discussed with hospitalist. Will admit to telemetry bed. Final Clinical Impressions(s) / ED Diagnoses   Final diagnoses:  None    New Prescriptions New Prescriptions   No medications on file     Julianne Rice, MD 10/24/15 2126

## 2015-10-24 NOTE — Progress Notes (Signed)
Pharmacy Note:  Initial antibiotics for Vancomycin ordered by MD for cellulitis. Warfarin for Hx PE/DVT and Afib.  Estimated Creatinine Clearance: 14 mL/min (by C-G formula based on SCr of 1.5 mg/dL (H)).   Allergies  Allergen Reactions  . Codeine Hives and Shortness Of Breath  . Fosamax [Alendronate Sodium] Hives and Nausea And Vomiting  . Penicillins Hives  . Sulfa Antibiotics Itching, Nausea And Vomiting and Rash  . Tramadol Other (See Comments)    Near syncope  . Boniva [Ibandronic Acid]   . Septra [Sulfamethoxazole-Trimethoprim]   . Vioxx [Rofecoxib]     Vitals:   10/24/15 1934 10/24/15 2215  BP: 177/78 181/70  Pulse: (!) 38 (!) 39  Resp: 15 16  Temp:  97.7 F (36.5 C)    Anti-infectives    Start     Dose/Rate Route Frequency Ordered Stop   10/24/15 2015  vancomycin (VANCOCIN) IVPB 1000 mg/200 mL premix     1,000 mg 200 mL/hr over 60 Minutes Intravenous  Once 10/24/15 2003 10/24/15 2204     Plan: Initial doses of Vancomycin X 1 ordered. F/U am lab for further dosing. Poor renal function, will not need dose soon. INR therapeutic. Patient has received Warfarin today per med list. Daily PT/INR.  Pricilla Larsson, [facility] 10/24/2015 10:26 PM

## 2015-10-24 NOTE — Therapy (Addendum)
[place], [place], [address] Phone: [phone]   Fax:  [phone]  Wound Care Evaluation  Patient Details  Name: Candace [name] MRN: [id number] Date of Birth: [birth year] Referring Provider: Particia Nearing  Encounter Date: 10/24/2015      PT End of Session - 10/24/15 1638    Visit Number 1   Number of Visits 14   Date for PT Re-Evaluation 11/23/15   Authorization Type [place] - Visit Number 1   Authorization - Number of Visits 10   PT Start Time 1510   PT Stop Time 1605   PT Time Calculation (min) 55 min   Activity Tolerance Patient limited by pain   Behavior During Therapy [facility] for tasks assessed/performed      Past Medical History:  Diagnosis Date  . Aortic stenosis   . Atrial fibrillation ([place])   . DVT (deep venous thrombosis) ([place])   . Essential hypertension   . Glaucoma   . History of skin cancer 2016  . Hypercholesterolemia   . PE (pulmonary embolism)     Past Surgical History:  Procedure Laterality Date  . CATARACT EXTRACTION    . CHOLECYSTECTOMY    . KYPHOPLASTY    . SKIN CANCER EXCISION Left 2016   neck    There were no vitals filed for this visit.        [facility] PT Assessment - 10/24/15 0001      Assessment   Medical Diagnosis Non healing wound   Referring Provider Particia Nearing   Onset Date/Surgical Date 09/23/15   Next MD Visit unknown   Prior Therapy self care, antibiotic      Precautions   Precautions None     Restrictions   Weight Bearing Restrictions No     Balance Screen   Has the patient fallen in the past 6 months No   Has the patient had a decrease in activity level because of a fear of falling?  No   Is the patient reluctant to leave their home because of a fear of falling?  No     Prior Function   Level of Independence Independent     Cognition   Overall Cognitive Status Within Functional Limits for tasks assessed         Wound  Therapy - 10/24/15 1616    Subjective Pt states that she scrapped the back of her Lt leg on concrete approximately 09/23/2015.  She put peroxide on it and it did not get better.  She then went to the MD who put her on antibiotics.  She has finished her antibiotics and she still does not feel better.  The therapist noted that her right leg was actually swollen and not the left.Mar[place] Kitchen  Upon inspection the therapist noted a wound on the plantar aspect of the pt right great toe.  The pt states that she goes barefooted all the time and she did not realize that she had a wound on her toe.    Patient and Family Stated Goals Wounds to heal    Date of Onset 09/23/15   Prior Treatments peroxide to Lt wound and antibiotic.    Pain Assessment 0-10   Pain Score 8    Pain Type Chronic pain   Pain Location Leg   Pain Orientation Left;Posterior   Pain Descriptors / Indicators Aching;Burning   Pain Onset On-going   Patients Stated Pain Goal 2   Pain Intervention(s)  Distraction;Emotional support   Multiple Pain Sites Yes   Pain Score 8   Pain Type Chronic pain   Pain Location Toe (Comment which one)  Rt great toe   Pain Onset --  unable to tell therapist    Patient's Stated Pain Goal 2   Pain Intervention(s) Emotional support   Evaluation and Treatment Procedures Explained to Patient/Family Yes   Evaluation and Treatment Procedures agreed to   Wound Properties Date First Assessed: 10/24/15 Time First Assessed: [id number] Wound Type: Puncture Location: Toe (Comment  which one) Location Orientation: Right Wound Description (Comments): plantar aspect of great toe    Dressing Type None   % Wound base Red or Granulating 0%   % Wound base Yellow 100%   Peri-wound Assessment Edema;Erythema (blanchable)   Wound Length (cm) 1.5 cm   Wound Width (cm) 1.8 cm   Wound Depth (cm) --  at least 1.5   Undermining (cm) --  entire periphery at least 1.0 cm    Drainage Amount Moderate   Drainage Description Purulent;Green    Treatment Cleansed;Debridement (Selective);Hydrotherapy (Pulse lavage);Other (Comment)   Wound Properties Date First Assessed: 10/24/15 Time First Assessed: 1615 Wound Type: Other (Comment) Location: Leg Location Orientation: Left;Posterior;Lower Wound Description (Comments): dry Present on Admission: Yes   Dressing Type None   Site / Wound Assessment Dry;Black   % Wound base Red or Granulating 0%   % Wound base Black 100%   Peri-wound Assessment Intact   Wound Length (cm) 2.5 cm   Wound Width (cm) 3 cm   Wound Depth (cm) --  at least .3 but unknown due to still having black eschar    Drainage Amount None  anticipate now with debridement wound will drain    Treatment Cleansed;Debridement (Selective);Hydrotherapy (Pulse lavage)   Pulsed lavage therapy - wound location wound sites   Pulsed Lavage with Suction (psi) 4 psi   Pulsed Lavage with Suction - Normal Saline Used 1000 mL   Pulsed Lavage Tip Tip with splash shield   Selective Debridement - Location --  entire wound   Selective Debridement - Tools Used Forceps;Scalpel;Scissors   Selective Debridement - Tissue Removed necrotic eschar from Lt wound; necrotic tissue from Rt great toe    Wound Therapy - Clinical Statement Pt is a 82 yo with CHF who has two chronic wounds.  The first she is on the posterior aspect of her Lt leg and the second is on the plantar aspect of her Rt great toe.  Upon inspection the wound on the pt Rt great toe has thick purulent drainage.  The wound is deep and the therapist recommended that the pt go to the ER to have her foot x-rayed as she may need IV antibiotic.  If she is not kept in the hospital we will continue with both pulse lavage and mechanical debridement to both wounds.  Dressing change of silverhydrofiber to Rt toe and medihoney to Lt calf.  The pt will benefit from three times a week for two weeks followed by 2 times a week for four weeks.     Wound Therapy - Functional Problem List difficult walking     Factors Delaying/Impairing Wound Healing Altered sensation;Infection - systemic/local;Multiple medical problems;Polypharmacy;Vascular compromise   Hydrotherapy Plan Debridement;Dressing change;Patient/family education;Pulsatile lavage with suction   Wound Therapy - Frequency 3X / week   Wound Therapy - Current Recommendations Other (comment)  referred for x-ray   Wound Plan Pt to recieve pulse lavage, debridement and dressing change to both  wounds.     Dressing  Rt great toe hydrogel, 2x2 and gauze due to pt going directily to ER.   Will change to silverhydrofiber if pt is not admitted    Dressing medihoney, 4x4 and kerlix followed by netting                          PT Education - 11/19/2015 1637    Education provided Yes   Education Details That pt should go to ER and have x-ray to ensure no bone involvement    Person(s) Educated Patient;Child(ren)   Methods Explanation   Comprehension Verbalized understanding          PT Short Term Goals - 11/19/2015 1725      PT SHORT TERM GOAL #1   Title Pt Lt LE to have 50% granulation to decrease risk of infection   Time 3   Period Weeks   Status New     PT SHORT TERM GOAL #2   Title Pt and familty to be able to verbalize the signs and symptoms of infection and the importance of seeking medical help   Time 1   Period Weeks   Status New     PT SHORT TERM GOAL #3   Title Pt Rt toe wound to no longer have purulent drainage to demonstrate decreased infection    Time 3   Period Weeks     PT SHORT TERM GOAL #4   Title Pt pain in both legs to be no greater than a 5/10 to allow pt to ambulate in comfort in her home   Time 3   Period Weeks           PT Long Term Goals - 11-19-15 1728      PT LONG TERM GOAL #1   Title Lt LE wound to be healed    Time 6   Period Weeks   Status New     PT LONG TERM GOAL #2   Title Rt great toe wound to have filled in depth to no greater than .3 and minimal undermining to allow  family to be comfortable with dressing wound    Time 6   Period Weeks   Status New              Plan - 2015-11-19 1724    Clinical Impression Statement see above   Rehab Potential Good   PT Frequency 3x / week   PT Duration 6 weeks   PT Treatment/Interventions ADLs/Self Care Home Management;Patient/family education;Other (comment)  debridement and dressing change   PT Next Visit Plan continue with pulse lavage, debridement and dressing change      Patient will benefit from skilled therapeutic intervention in order to improve the following deficits and impairments:  Pain, Difficulty walking, Decreased skin integrity  Visit Diagnosis: Pain in right lower leg  Pain in left lower leg  Difficulty in walking, not elsewhere classified  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 Aortic atherosclerosis ([place]) 09/01/2015  . Sick sinus syndrome ([place]) 08/11/2015  . Non-sustained ventricular tachycardia ([place]) 08/11/2015  . Anticoagulated on Coumadin 08/11/2015  . CKD (chronic kidney disease) stage 3, GFR 30-59 ml/min 08/09/2015  . Elevated troponin 08/08/2015  . Renal insufficiency 08/08/2015  . Severe aortic stenosis 08/05/2015  . Hyperlipidemia 06/10/2015  . Tobacco use disorder 01/28/2014  . Osteoporosis with pathological fracture 08/13/2012  . DVT (deep venous thrombosis), right 05/13/2012  . HTN  (hypertension) 05/13/2012  . Long term current use of anticoagulant therapy 05/13/2012  . Atrial fibrillation [facility]) 04/15/2012    Rayetta [name], PT CLT [phone] 10/24/2015, 5:30 PM  [facility] [address] [place], [place], [address] Phone: [phone]   Fax:  [phone]  Name: Candace [name] MRN: [id number] Date of Birth: Nov 10, 1922

## 2015-10-24 NOTE — ED Notes (Signed)
CRITICAL VALUE ALERT  Critical value received:  Troponin 0.05  Date of notification:  10/24/15  Time of notification:  2225  Critical value read back:Yes.    Nurse who received alert:  S. Karrie Meres, RN  MD notified (1st page):  Dr. Myna Hidalgo Time of first page:  2225

## 2015-10-24 NOTE — Telephone Encounter (Signed)
I spoke with Izora Gala from outpatient rehab and they advise that patient go to the ER for an infected toe that they believe is involving the bone. They feel like patient may need som IV antibiotics. Glenard Haring is in agreement and Family advised to take to ER.

## 2015-10-24 NOTE — ED Triage Notes (Signed)
Pt sent by Wound Clinic for evaluation of R great toe infection. Unknown onset of symptoms. Per family, WC removed a large amount of purulent drainage.

## 2015-10-24 NOTE — Progress Notes (Signed)
BP (!) 160/92   Pulse (!) 56   Temp (!) 96.7 F (35.9 C) (Oral)   Wt 90 lb (40.8 kg)   BMI 20.18 kg/m    Subjective:    Patient ID: Candace [name], female    DOB: 1922/02/26, 82 y.o.   MRN: [id number]  HPI: Candace [name] is a 82 y.o. female presenting on 10/19/2015 for Open area to left leg (Would like referral to wound center)  Greater than 3 week history of skin tear on posterior left leg.  She was regularly washing the wound and use peroxide and has dried it out. Instructions given by Dr. Warrick Parisian to keep cover and moist and wash with saline only. She has longstanding PVD and edema.  Daughter present in the visit and discussed wound care need.  Reiterated need for moisture and avoid drying agents.   Relevant past medical, surgical, family and social history reviewed and updated as indicated. Allergies and medications reviewed and updated.  Past Medical History:  Diagnosis Date  . Aortic stenosis   . Atrial fibrillation ([place])   . DVT (deep venous thrombosis) ([place])   . Essential hypertension   . Glaucoma   . History of skin cancer 2016  . Hypercholesterolemia   . PE (pulmonary embolism)     Past Surgical History:  Procedure Laterality Date  . CATARACT EXTRACTION    . CHOLECYSTECTOMY    . KYPHOPLASTY    . SKIN CANCER EXCISION Left 2016   neck    Review of Systems  Constitutional: Negative.   HENT: Negative.   Eyes: Negative.   Respiratory: Negative.  Negative for shortness of breath and wheezing.   Cardiovascular: Positive for leg swelling. Negative for chest pain and palpitations.  Gastrointestinal: Negative.   Genitourinary: Negative.   Musculoskeletal: Negative.   Skin: Positive for color change and wound.      Medication List       Accurate as of 10/19/15 11:59 PM. Always use your most recent med list.          acetaminophen 500 MG tablet Commonly known as:  TYLENOL Take 1 tablet (500 mg total) by mouth every 8 (eight) hours as needed for  mild pain.   amLODipine 2.5 MG tablet Commonly known as:  NORVASC Take 1 tablet (2.5 mg total) by mouth daily.   brimonidine 0.1 % Soln Commonly known as:  ALPHAGAN P Apply 1 drop to eye 2 (two) times daily.   calcitonin (salmon) 200 UNIT/ACT nasal spray Commonly known as:  MIACALCIN Place 1 spray into alternate nostrils daily.   denosumab 60 MG/ML Soln injection Commonly known as:  PROLIA INJECT 60 MG INTO THE SKIN EVERY 6 MONTHS. BRING TO OFFICE FOR ADMINSTRATION   furosemide 20 MG tablet Commonly known as:  LASIX Take one tablet daily   LUMIGAN 0.01 % Soln Generic drug:  bimatoprost INSTILL ONE DROP INTO EACH EYE AT BEDTIME   megestrol 400 MG/10ML suspension Commonly known as:  MEGACE Take 10 mLs (400 mg total) by mouth daily.   minocycline 100 MG capsule Commonly known as:  MINOCIN Take 1 capsule (100 mg total) by mouth 2 (two) times daily. Take on an empty stomach   mupirocin cream 2 % Commonly known as:  BACTROBAN Apply 1 application topically 2 (two) times daily.   nabumetone 500 MG tablet Commonly known as:  RELAFEN Take 2 tablets (1,000 mg total) by mouth 2 (two) times daily. For severe back pain.   nitroGLYCERIN 0.4 MG  SL tablet Commonly known as:  NITROSTAT Place 1 tablet (0.4 mg total) under the tongue every 5 (five) minutes as needed for chest pain.   simvastatin 40 MG tablet Commonly known as:  ZOCOR Take 1 tablet (40 mg total) by mouth at bedtime.   warfarin 2 MG tablet Commonly known as:  COUMADIN TAKE ONE OR TWO TABLETS BY MOUTH ONCE DAILY AS DIRECTED BY ANTICOAGULATION          Objective:    BP (!) 160/92   Pulse (!) 56   Temp (!) 96.7 F (35.9 C) (Oral)   Wt 90 lb (40.8 kg)   BMI 20.18 kg/m   Allergies  Allergen Reactions  . Codeine Hives and Shortness Of Breath  . Fosamax [Alendronate Sodium] Hives and Nausea And Vomiting  . Penicillins Hives  . Sulfa Antibiotics Itching, Nausea And Vomiting and Rash  . Tramadol Other (See  Comments)    Near syncope  . Boniva [Ibandronic Acid]   . Septra [Sulfamethoxazole-Trimethoprim]   . Vioxx [Rofecoxib]     Physical Exam  Constitutional: She is oriented to person, place, and time. She appears well-developed and well-nourished.  HENT:  Head: Normocephalic and atraumatic.  Eyes: Conjunctivae and EOM are normal. Pupils are equal, round, and reactive to light.  Cardiovascular: Normal rate, regular rhythm, normal heart sounds and intact distal pulses.   Pulmonary/Chest: Effort normal and breath sounds normal.  Abdominal: Soft. Bowel sounds are normal.  Neurological: She is alert and oriented to person, place, and time. She has normal reflexes.  Skin: Skin is warm and dry. Lesion and rash noted. There is erythema.     Eschar on the tear, moderate bilateral low leg edema, positive erythema around wound and distal ankle  Psychiatric: She has a normal mood and affect. Her behavior is normal. Judgment and thought content normal.  Nursing note and vitals reviewed.   Results for orders placed or performed in visit on 10/19/15  CoaguChek XS/INR Waived  Result Value Ref Range   INR 2.6 (H) 0.9 - 1.1   Prothrombin Time 31.5 sec      Assessment & Plan:   1. DVT (deep venous thrombosis), right HISTORY - CoaguChek XS/INR Waived  2. Long term current use of anticoagulant therapy Continue current dosing - CoaguChek XS/INR Waived  3. Atrial fibrillation, unspecified type ([place]) - CoaguChek XS/INR Waived  4. Stasis ulcer of lower extremity, left ([place]) - Ambulatory referral to Pain Clinic   Continue all other maintenance medications as listed above.  Follow up plan: Return if symptoms worsen or fail to improve.  Orders Placed This Encounter  Procedures  . Ambulatory referral to Pain Clinic    Educational handout given for cellulitis  Terald Sleeper PA-C [facility] [address]  [place],  [id number] [phone]   10/24/2015,  3:10 AM

## 2015-10-24 NOTE — Telephone Encounter (Signed)
Spoke to nurse Abigail Butts, let her know that our PT Azucena Freed was recommending that this patient leave our office and go to ED to be tx for wound on Right Great Toe. Wound is red and weeping with pus. Family agreed to take her to ED.  NF 10/24/15

## 2015-10-24 NOTE — H&P (Signed)
History and Physical    Candace [name] [id number] DOB: 01/04/1923 DOA: 10/24/2015  PCP: Chevis Pretty, FNP   Patient coming from: Wound care clinic  Chief Complaint: General malaise, purulent drainage from right great toe  HPI: Candace [name] is a 82 y.o. female with medical history significant for atrial fibrillation, DVT, PE, hypertension, sick sinus syndrome, and chronic venous stasis with ulcer managed by wound care, presenting to the emergency department today for evaluation of purulent drainage from a right great toe wound. Patient had been undergoing wound care for a chronic stasis ulcer of the left calf when the therapist noted a wound at the plantar aspect of the right great toe. There was copious amounts of purulent drainage expressed from the wound and the patient was referred to the emergency department for further evaluation of this. Patient denies fevers or chills, but notes malaise, nausea, and loss of appetite for the past several days. She denies any pain at the right foot and had not noticed the wound previously. She denies chest pain or palpitations and denies lightheadedness, headache, change in vision or hearing, or focal numbness or weakness. She denies dyspnea or cough.  ED Course: Upon arrival to the ED, patient is found to be afebrile, saturating well on room air, bradycardic in the 50s, and mildly hypertensive. Chemistry panel features a potassium of 2.8 and creatinine 1.50, up some from her apparent baseline of 1.3. CBC is unremarkable and INR is within the therapeutic range at 2.83. Radiograph of the right great toe is notable for diffuse soft tissue swelling about the great toe, but without evidence for osteomyelitis. Sedimentation rate and CRP remained pending at this time, as did his EKG. Patient was given a 250 mL normal saline bolus and potassium was replaced with 20 mEq IV and 40 mEq oral. Empiric treatment was initiated with vancomycin and Zosyn.  Patient remained bradycardic in the ED with heart rate dipping as low as 38, but she remained asymptomatic with this and her potassium is actively being replaced. She will be admitted to the telemetry unit for ongoing evaluation and management of purulent cellulitis involving the right foot and asymptomatic bradycardia in a patient with severe hypokalemia and known sick sinus syndrome.  Review of Systems:  All other systems reviewed and apart from HPI, are negative.  Past Medical History:  Diagnosis Date  . Aortic stenosis   . Atrial fibrillation ([place])   . DVT (deep venous thrombosis) ([place])   . Essential hypertension   . Glaucoma   . History of skin cancer 2016  . Hypercholesterolemia   . PE (pulmonary embolism)     Past Surgical History:  Procedure Laterality Date  . CATARACT EXTRACTION    . CHOLECYSTECTOMY    . KYPHOPLASTY    . SKIN CANCER EXCISION Left 2016   neck     reports that she has never smoked. Her smokeless tobacco use includes Snuff. She reports that she does not drink alcohol or use drugs.  Allergies  Allergen Reactions  . Codeine Hives and Shortness Of Breath  . Fosamax [Alendronate Sodium] Hives and Nausea And Vomiting  . Penicillins Hives  . Sulfa Antibiotics Itching, Nausea And Vomiting and Rash  . Tramadol Other (See Comments)    Near syncope  . Boniva [Ibandronic Acid]   . Septra [Sulfamethoxazole-Trimethoprim]   . Vioxx [Rofecoxib]     Family History  Problem Relation Age of Onset  . Stroke Father   . Heart attack Mother   .  Diabetes Brother   . Heart disease Brother   . Early death Sister      Prior to Admission medications   Medication Sig Start Date End Date Taking? Authorizing Provider  acetaminophen (TYLENOL) 500 MG tablet Take 1 tablet (500 mg total) by mouth every 8 (eight) hours as needed for mild pain. 08/14/15  Yes Samuella Cota, MD  amLODipine (NORVASC) 2.5 MG tablet Take 1 tablet (2.5 mg total) by mouth daily. 09/28/15  Yes Minus Breeding, MD  brimonidine (ALPHAGAN P) 0.1 % SOLN Apply 1 drop to eye 2 (two) times daily.    Yes Historical Provider, MD  furosemide (LASIX) 20 MG tablet Take one tablet daily Patient taking differently: Take 20 mg by mouth daily. Take one tablet daily 07/28/15  Yes [name] Done, FNP  nitroGLYCERIN (NITROSTAT) 0.4 MG SL tablet Place 1 tablet (0.4 mg total) under the tongue every 5 (five) minutes as needed for chest pain. 08/18/15  Yes Minus Breeding, MD  warfarin (COUMADIN) 2 MG tablet TAKE ONE OR TWO TABLETS BY MOUTH ONCE DAILY AS DIRECTED BY ANTICOAGULATION Patient taking differently: TAKE ONE TABLET BY MOUTH ONCE DAILY AS DIRECTED BY ANTICOAGULATION 08/24/15  Yes [name] Done, FNP  calcitonin, salmon, (MIACALCIN) 200 UNIT/ACT nasal spray Place 1 spray into alternate nostrils daily. Patient not taking: Reported on 10/24/2015 09/26/15   Claretta Fraise, MD  denosumab (PROLIA) 60 MG/ML SOLN injection INJECT 60 MG INTO THE SKIN EVERY 6 MONTHS. BRING TO OFFICE FOR ADMINSTRATION 09/12/15   [name] Done, FNP  LUMIGAN 0.01 % SOLN INSTILL ONE DROP INTO [facility] EYE AT BEDTIME 10/04/15   [name] Done, FNP  megestrol (MEGACE) 400 MG/10ML suspension Take 10 mLs (400 mg total) by mouth daily. Patient not taking: Reported on 10/24/2015 09/13/15   [name] Done, FNP  minocycline (MINOCIN) 100 MG capsule Take 1 capsule (100 mg total) by mouth 2 (two) times daily. Take on an empty stomach 10/11/15   Claretta Fraise, MD  mupirocin cream (BACTROBAN) 2 % Apply 1 application topically 2 (two) times daily. 10/07/15   Terald Sleeper, PA-C  nabumetone (RELAFEN) 500 MG tablet Take 2 tablets (1,000 mg total) by mouth 2 (two) times daily. For severe back pain. 09/26/15   Claretta Fraise, MD  simvastatin (ZOCOR) 40 MG tablet Take 1 tablet (40 mg total) by mouth at bedtime. 06/10/15   [name] Done, FNP    Physical Exam: Vitals:   10/24/15 1656 10/24/15 1934  BP: 164/71 177/78  Pulse: (!) 56 (!)  38  Resp: 18 15  Temp: 97.1 F (36.2 C)   TempSrc: Oral   SpO2: 100% 100%  Weight: 40.8 kg (90 lb)   Height: 4\' 8"  (1.422 m)       Constitutional: NAD, calm, comfortable Eyes: PERTLA, lids and conjunctivae normal ENMT: Mucous membranes are moist. Posterior pharynx clear of any exudate or lesions.   Neck: normal, supple, no masses, no thyromegaly Respiratory: clear to auscultation bilaterally, no wheezing, no crackles. Normal respiratory effort.   Cardiovascular: Rate ~50 and regular with loud systolic crescendo-decrescendo murmur at upper sternal borders. No extremity edema. No significant JVD. Abdomen: No distension, no tenderness, no masses palpated. Bowel sounds normal.  Musculoskeletal: no clubbing / cyanosis. Plantar aspect right great toe with a 3-4 mm defect in a thick callous that opens to deep cavity with purulent drainage. Normal muscle tone.  Skin: Erythema surrounds the right great toe wound described above and tracks along the dorsal aspect of the foot. Warm, dry, well-perfused.  Neurologic: CN 2-12 grossly intact. Sensation intact, DTR normal. Strength 5/5 in all 4 limbs.  Psychiatric: Normal judgment and insight. Alert and oriented x 3. Normal mood and affect.     Labs on Admission: I have personally reviewed following labs and imaging studies  CBC:  Recent Labs Lab 10/24/15 2012  WBC 6.2  NEUTROABS 4.6  HGB 14.0  HCT 41.8  MCV 85.7  PLT 0000000   Basic Metabolic Panel:  Recent Labs Lab 10/24/15 2012  NA 136  K 2.8*  CL 98*  CO2 28  GLUCOSE 103*  BUN 27*  CREATININE 1.50*  CALCIUM 8.4*   GFR: Estimated Creatinine Clearance: 14 mL/min (by C-G formula based on SCr of 1.5 mg/dL (H)). Liver Function Tests:  Recent Labs Lab 10/24/15 2012  AST 32  ALT 19  ALKPHOS 79  BILITOT 0.9  PROT 6.0*  ALBUMIN 3.2*   No results for input(s): LIPASE, AMYLASE in the last 168 hours. No results for input(s): AMMONIA in the last 168 hours. Coagulation  Profile:  Recent Labs Lab 10/19/15 1715 10/24/15 2012  INR 2.6* 2.83   Cardiac Enzymes: No results for input(s): CKTOTAL, CKMB, CKMBINDEX, TROPONINI in the last 168 hours. BNP (last 3 results) No results for input(s): PROBNP in the last 8760 hours. HbA1C: No results for input(s): HGBA1C in the last 72 hours. CBG: No results for input(s): GLUCAP in the last 168 hours. Lipid Profile: No results for input(s): CHOL, HDL, LDLCALC, TRIG, CHOLHDL, LDLDIRECT in the last 72 hours. Thyroid Function Tests: No results for input(s): TSH, T4TOTAL, FREET4, T3FREE, THYROIDAB in the last 72 hours. Anemia Panel: No results for input(s): VITAMINB12, FOLATE, FERRITIN, TIBC, IRON, RETICCTPCT in the last 72 hours. Urine analysis:    Component Value Date/Time   COLORURINE YELLOW 06/06/2009 2144   APPEARANCEUR Clear 08/30/2015 1624   LABSPEC 1.015 06/06/2009 2144   PHURINE 5.5 06/06/2009 2144   GLUCOSEU Negative 08/30/2015 1624   HGBUR TRACE (A) 06/06/2009 2144   BILIRUBINUR Negative 08/30/2015 1624   KETONESUR NEGATIVE 06/06/2009 2144   PROTEINUR Trace 08/30/2015 1624   PROTEINUR NEGATIVE 06/06/2009 2144   UROBILINOGEN 0.2 06/06/2009 2144   NITRITE Negative 08/30/2015 1624   NITRITE NEGATIVE 06/06/2009 2144   LEUKOCYTESUR Negative 08/30/2015 1624   Sepsis Labs: @LABRCNTIP (procalcitonin:4,lacticidven:4) )No results found for this or any previous visit (from the past 240 hour(s)).   Radiological Exams on Admission: Dg Toe Great Right  Result Date: 10/24/2015 CLINICAL DATA:  Infection great toe EXAM: RIGHT GREAT TOE COMPARISON:  None. FINDINGS: Diffuse soft tissue swelling. No gas in the soft tissues. There is bandage overlying the great toe. Negative for osteomyelitis.  Negative for fracture. Diffuse arterial calcification. IMPRESSION: Diffuse soft tissue swelling of the great toe. Negative for osteomyelitis. Electronically Signed   By: Franchot Gallo M.D.   On: 10/24/2015 17:18    EKG:  Ordered and pending  Assessment/Plan  1. Purulent cellulitis, right foot  - There is a thick callous at plantar aspect right great toe with purulent drainage from a central ulceration  - Surrounding cellulitis tracks around to the dorsal aspect of the foot; erythema outlined with skin pen on presenation  - Pt endorses malaise and chills, but there is no fever or leukocytosis; the toe is insensate  - Plain radiographs with no evidence for osteo; sed rate and CRP remain pending  - Continue empiric therapy with vancomycin; wound care consultation requested   2. Hypokalemia   - Serum potassium 2.8 on admission -  Unknown etiology, will add-on magnesium level; 2 g mag given empirically  - She was treated with 20 mEq IV, and 40 mEq PO potassium in ED - Monitoring on telemetry; repeat chem panel in am   3. Atrial fibrillation  - CHADS-VASc at least 3 (age x2, gender, HTN) - She also has hx of VTE and is anticoagulated with warfarin, INR therapeutic on admission, will continue  - Rate is low without any beta-blocker, non-dihydropyridine CCB, or antiarrhythmic; monitoring on telemetry   4. Sick sinus syndrome  - She is bradycardic in ED, mainly in 50s with a dip to 38 - Pt asymptomatic with her bradycardia; HR has been documented in 40's and 50's at almost every office visit in last yr - Monitoring on telemetry    5. Hypertension  - Mildly elevated in ED - Continue Norvasc as tolerated   6. CKD stage III  - SCr 1.50 on admission, a little up from her apparent baseline of ~1.3  - She appears a little dry on admission and was given 250 cc NS bolus in ED  - Continuing NS at 50 cc/hr overnight and allowing ad lib PO fluids  - She typically takes Lasix at home; this is held; following daily wts and strict I/O's     DVT prophylaxis: warfarin  Code Status: DNR Family Communication: Discussed with patient Disposition Plan: Admit to telemetry Consults called: None Admission status: Inpatient     Vianne Bulls, MD [facility] Pager [phone]  If 7PM-7AM, please contact night-coverage [email or web address] Password [email or web address]  10/24/2015, 9:53 PM

## 2015-10-25 ENCOUNTER — Observation Stay (HOSPITAL_COMMUNITY)
Admission: EM | Admit: 2015-10-25 | Discharge: 2015-10-26 | Disposition: A | Discharge status: Home / Self Care | Payer: Medicare Other | Attending: Internal Medicine | Admitting: Internal Medicine

## 2015-10-25 ENCOUNTER — Telehealth: Payer: Self-pay | Admitting: Pharmacist

## 2015-10-25 ENCOUNTER — Encounter (HOSPITAL_COMMUNITY): Payer: Self-pay | Admitting: Emergency Medicine

## 2015-10-25 DIAGNOSIS — S80212A Abrasion, left knee, initial encounter: Secondary | ICD-10-CM | POA: Insufficient documentation

## 2015-10-25 DIAGNOSIS — S80211A Abrasion, right knee, initial encounter: Secondary | ICD-10-CM | POA: Diagnosis not present

## 2015-10-25 DIAGNOSIS — L03115 Cellulitis of right lower limb: Secondary | ICD-10-CM

## 2015-10-25 DIAGNOSIS — I10 Essential (primary) hypertension: Secondary | ICD-10-CM | POA: Diagnosis not present

## 2015-10-25 DIAGNOSIS — L0390 Cellulitis, unspecified: Secondary | ICD-10-CM | POA: Diagnosis present

## 2015-10-25 DIAGNOSIS — L089 Local infection of the skin and subcutaneous tissue, unspecified: Secondary | ICD-10-CM | POA: Diagnosis not present

## 2015-10-25 DIAGNOSIS — Z85828 Personal history of other malignant neoplasm of skin: Secondary | ICD-10-CM | POA: Diagnosis not present

## 2015-10-25 DIAGNOSIS — Y9389 Activity, other specified: Secondary | ICD-10-CM | POA: Insufficient documentation

## 2015-10-25 DIAGNOSIS — I4891 Unspecified atrial fibrillation: Secondary | ICD-10-CM | POA: Diagnosis present

## 2015-10-25 DIAGNOSIS — Z7901 Long term (current) use of anticoagulants: Secondary | ICD-10-CM | POA: Diagnosis not present

## 2015-10-25 DIAGNOSIS — R001 Bradycardia, unspecified: Secondary | ICD-10-CM | POA: Insufficient documentation

## 2015-10-25 DIAGNOSIS — Y999 Unspecified external cause status: Secondary | ICD-10-CM | POA: Insufficient documentation

## 2015-10-25 DIAGNOSIS — E785 Hyperlipidemia, unspecified: Secondary | ICD-10-CM | POA: Diagnosis present

## 2015-10-25 DIAGNOSIS — F17200 Nicotine dependence, unspecified, uncomplicated: Secondary | ICD-10-CM | POA: Diagnosis present

## 2015-10-25 DIAGNOSIS — F17290 Nicotine dependence, other tobacco product, uncomplicated: Secondary | ICD-10-CM | POA: Insufficient documentation

## 2015-10-25 DIAGNOSIS — T148XXA Other injury of unspecified body region, initial encounter: Secondary | ICD-10-CM | POA: Diagnosis not present

## 2015-10-25 DIAGNOSIS — Y929 Unspecified place or not applicable: Secondary | ICD-10-CM | POA: Insufficient documentation

## 2015-10-25 DIAGNOSIS — W010XXA Fall on same level from slipping, tripping and stumbling without subsequent striking against object, initial encounter: Secondary | ICD-10-CM | POA: Insufficient documentation

## 2015-10-25 LAB — CBC WITH DIFFERENTIAL/PLATELET
BASOS ABS: 0 10*3/uL (ref 0.0–0.1)
BASOS PCT: 0 %
EOS ABS: 0 10*3/uL (ref 0.0–0.7)
Eosinophils Relative: 0 %
HEMATOCRIT: 40.3 % (ref 36.0–46.0)
HEMOGLOBIN: 13.3 g/dL (ref 12.0–15.0)
Lymphocytes Relative: 15 %
Lymphs Abs: 0.8 10*3/uL (ref 0.7–4.0)
MCH: 28.6 pg (ref 26.0–34.0)
MCHC: 33 g/dL (ref 30.0–36.0)
MCV: 86.7 fL (ref 78.0–100.0)
MONO ABS: 0.6 10*3/uL (ref 0.1–1.0)
MONOS PCT: 12 %
NEUTROS ABS: 3.8 10*3/uL (ref 1.7–7.7)
NEUTROS PCT: 73 %
Platelets: 134 10*3/uL — ABNORMAL LOW (ref 150–400)
RBC: 4.65 MIL/uL (ref 3.87–5.11)
RDW: 13.8 % (ref 11.5–15.5)
WBC: 5.2 10*3/uL (ref 4.0–10.5)

## 2015-10-25 LAB — BASIC METABOLIC PANEL
ANION GAP: 9 (ref 5–15)
BUN: 25 mg/dL — ABNORMAL HIGH (ref 6–20)
CALCIUM: 8.3 mg/dL — AB (ref 8.9–10.3)
CO2: 28 mmol/L (ref 22–32)
CREATININE: 1.3 mg/dL — AB (ref 0.44–1.00)
Chloride: 100 mmol/L — ABNORMAL LOW (ref 101–111)
GFR calc non Af Amer: 35 mL/min — ABNORMAL LOW (ref >60)
GFR, EST AFRICAN AMERICAN: 40 mL/min — AB (ref >60)
Glucose, Bld: 102 mg/dL — ABNORMAL HIGH (ref 65–99)
Potassium: 4 mmol/L (ref 3.5–5.1)
SODIUM: 137 mmol/L (ref 135–145)

## 2015-10-25 LAB — PROTIME-INR
INR: 3.11
Prothrombin Time: 32.8 seconds — ABNORMAL HIGH (ref 11.4–15.2)

## 2015-10-25 LAB — C-REACTIVE PROTEIN: CRP: 1 mg/dL — AB (ref <1.0)

## 2015-10-25 MED ORDER — MORPHINE SULFATE (PF) 2 MG/ML IV SOLN
1.0000 mg | INTRAVENOUS | Status: DC | PRN
  Filled 2015-10-25: qty 1

## 2015-10-25 MED ORDER — ENSURE ENLIVE PO LIQD
237.0000 mL | Freq: Two times a day (BID) | ORAL | Status: DC
  Administered 2015-10-26: 237 mL via ORAL

## 2015-10-25 MED ORDER — VANCOMYCIN HCL IN DEXTROSE 1-5 GM/200ML-% IV SOLN
1000.0000 mg | Freq: Once | INTRAVENOUS | Status: AC
Start: 2015-10-25 — End: 2015-10-25
  Administered 2015-10-25: 1000 mg via INTRAVENOUS
  Filled 2015-10-25: qty 200

## 2015-10-25 MED ORDER — AMLODIPINE BESYLATE 5 MG PO TABS
2.5000 mg | ORAL_TABLET | Freq: Every day | ORAL | Status: DC
  Administered 2015-10-26: 2.5 mg via ORAL
  Filled 2015-10-25 (×2): qty 1

## 2015-10-25 MED ORDER — ZOLPIDEM TARTRATE 5 MG PO TABS
5.0000 mg | ORAL_TABLET | Freq: Every evening | ORAL | Status: DC | PRN
  Filled 2015-10-25: qty 1

## 2015-10-25 MED ORDER — LIP MEDEX EX OINT
1.0000 "application " | TOPICAL_OINTMENT | CUTANEOUS | Status: DC | PRN
  Filled 2015-10-25: qty 7

## 2015-10-25 MED ORDER — LATANOPROST 0.005 % OP SOLN
1.0000 [drp] | Freq: Every day | OPHTHALMIC | Status: DC
  Administered 2015-10-25: 1 [drp] via OPHTHALMIC
  Filled 2015-10-25: qty 2.5

## 2015-10-25 MED ORDER — SODIUM CHLORIDE 0.9% FLUSH
3.0000 mL | Freq: Two times a day (BID) | INTRAVENOUS | Status: DC
  Administered 2015-10-25 – 2015-10-26 (×2): 3 mL via INTRAVENOUS

## 2015-10-25 MED ORDER — BRIMONIDINE TARTRATE 0.15 % OP SOLN
1.0000 [drp] | Freq: Two times a day (BID) | OPHTHALMIC | Status: DC
  Administered 2015-10-25 – 2015-10-26 (×2): 1 [drp] via OPHTHALMIC
  Filled 2015-10-25: qty 5

## 2015-10-25 MED ORDER — VANCOMYCIN HCL IN DEXTROSE 1-5 GM/200ML-% IV SOLN: 1000.0000 mg | Freq: Once | INTRAVENOUS | Status: DC

## 2015-10-25 MED ORDER — ACETAMINOPHEN 650 MG RE SUPP: 650.0000 mg | Freq: Four times a day (QID) | RECTAL | Status: DC | PRN

## 2015-10-25 MED ORDER — ACETAMINOPHEN 325 MG PO TABS
650.0000 mg | ORAL_TABLET | Freq: Four times a day (QID) | ORAL | Status: DC | PRN
Start: 2015-10-25 — End: 2015-10-26
  Administered 2015-10-25: 650 mg via ORAL
  Filled 2015-10-25 (×2): qty 2

## 2015-10-25 MED ORDER — NITROGLYCERIN 0.4 MG SL SUBL: 0.4000 mg | SUBLINGUAL_TABLET | SUBLINGUAL | Status: DC | PRN

## 2015-10-25 MED ORDER — VANCOMYCIN HCL 500 MG IV SOLR
500.0000 mg | INTRAVENOUS | Status: DC
  Filled 2015-10-25: qty 500

## 2015-10-25 MED ORDER — LATANOPROST 0.005 % OP SOLN
OPHTHALMIC | Status: AC
  Filled 2015-10-25: qty 2.5

## 2015-10-25 MED ORDER — SIMVASTATIN 10 MG PO TABS
40.0000 mg | ORAL_TABLET | Freq: Every day | ORAL | Status: DC
  Administered 2015-10-25: 40 mg via ORAL
  Filled 2015-10-25: qty 4

## 2015-10-25 NOTE — Addendum Note (Signed)
Addended by: Leeroy Cha on: 10/25/2015 09:35 AM   Modules accepted: Orders

## 2015-10-25 NOTE — ED Notes (Signed)
Attempted report x1. 

## 2015-10-25 NOTE — ED Provider Notes (Signed)
[facility] DEPT Provider Note   CSN: [id number] Arrival date & time: 10/25/15  1047  By signing my name below, I, Candace [name], attest that this documentation has been prepared under the direction and in the presence of Candace Rice, MD. Electronically Signed: Rayna [name], ED Scribe. 10/25/15. 12:37 PM.   History   Chief Complaint Chief Complaint  Patient presents with  . Fall    HPI HPI Comments: [name] is a 82 y.o. female with a significant medical history listed below who presents to the Emergency Department complaining for evaluation of lower extremity infection. Pt was seen yesterday for an infected wound to her right great toe left AMA after receiving single dose of IV abx. PCP urged her to come to the ED today for further IV abx. Pt had a mechanical fall from standing landing on her knees. Sustained abrasions. Denies head injury or loss of consciousness.. Her daughter states that she tripped when getting into the car and fell to the ground. She denies fevers, chills, nausea, vomiting, weakness or malaise.   The history is provided by the patient and a relative. No language interpreter was used.    Past Medical History:  Diagnosis Date  . Aortic stenosis   . Atrial fibrillation ([place])   . DVT (deep venous thrombosis) ([place])   . Essential hypertension   . Glaucoma   . History of skin cancer 2016  . Hypercholesterolemia   . PE (pulmonary embolism)     Patient Active Problem List   Diagnosis Date Noted  . Cellulitis of right foot 10/24/2015  . Hypokalemia 10/24/2015  . Bradycardia 10/24/2015  . Cellulitis of foot, right 10/24/2015  . Wound infection   . Aortic atherosclerosis ([place]) 09/01/2015  . Sick sinus syndrome ([place]) 08/11/2015  . Non-sustained ventricular tachycardia ([place]) 08/11/2015  . Anticoagulated on Coumadin 08/11/2015  . CKD (chronic kidney disease) stage 3, GFR 30-59 ml/min 08/09/2015  . Elevated troponin 08/08/2015  . Renal  insufficiency 08/08/2015  . Severe aortic stenosis 08/05/2015  . Hyperlipidemia 06/10/2015  . Tobacco use disorder 01/28/2014  . Osteoporosis with pathological fracture 08/13/2012  . DVT (deep venous thrombosis), right 05/13/2012  . HTN (hypertension) 05/13/2012  . Long term current use of anticoagulant therapy 05/13/2012  . Atrial fibrillation ([place]) 04/15/2012    Past Surgical History:  Procedure Laterality Date  . CATARACT EXTRACTION    . CHOLECYSTECTOMY    . KYPHOPLASTY    . SKIN CANCER EXCISION Left 2016   neck    OB History    Gravida Para Term Preterm AB Living             4   SAB TAB Ectopic Multiple Live Births                   Home Medications    Prior to Admission medications   Medication Sig Start Date End Date Taking? Authorizing Provider  acetaminophen (TYLENOL) 500 MG tablet Take 1 tablet (500 mg total) by mouth every 8 (eight) hours as needed for mild pain. 08/14/15  Yes Samuella Cota, MD  amLODipine (NORVASC) 2.5 MG tablet Take 1 tablet (2.5 mg total) by mouth daily. 09/28/15  Yes Minus Breeding, MD  brimonidine (ALPHAGAN P) 0.1 % SOLN Apply 1 drop to eye 2 (two) times daily.    Yes Historical Provider, MD  denosumab (PROLIA) 60 MG/ML SOLN injection INJECT 60 MG INTO THE SKIN EVERY 6 MONTHS. BRING TO OFFICE FOR ADMINSTRATION 09/12/15  Yes Mary-Margaret  Hassell Done, FNP  furosemide (LASIX) 20 MG tablet Take one tablet daily Patient taking differently: Take 20 mg by mouth daily. Take one tablet daily 07/28/15  Yes [name] Done, FNP  LUMIGAN 0.01 % SOLN INSTILL ONE DROP INTO [facility] EYE AT BEDTIME 10/04/15  Yes [name] Done, FNP  nitroGLYCERIN (NITROSTAT) 0.4 MG SL tablet Place 1 tablet (0.4 mg total) under the tongue every 5 (five) minutes as needed for chest pain. 08/18/15  Yes Minus Breeding, MD  simvastatin (ZOCOR) 40 MG tablet Take 1 tablet (40 mg total) by mouth at bedtime. 06/10/15  Yes [name] Done, FNP  warfarin (COUMADIN) 2 MG tablet TAKE  ONE OR TWO TABLETS BY MOUTH ONCE DAILY AS DIRECTED BY ANTICOAGULATION Patient taking differently: TAKE ONE TABLET BY MOUTH ONCE DAILY AS DIRECTED BY ANTICOAGULATION 08/24/15  Yes [name] Done, FNP  megestrol (MEGACE) 400 MG/10ML suspension Take 10 mLs (400 mg total) by mouth daily. Patient not taking: Reported on 10/24/2015 09/13/15   [name] Done, FNP  minocycline (MINOCIN) 100 MG capsule Take 1 capsule (100 mg total) by mouth 2 (two) times daily. Take on an empty stomach Patient not taking: Reported on 10/25/2015 10/11/15   Claretta Fraise, MD  mupirocin cream (BACTROBAN) 2 % Apply 1 application topically 2 (two) times daily. Patient not taking: Reported on 10/25/2015 10/07/15   Terald Sleeper, PA-C  nabumetone (RELAFEN) 500 MG tablet Take 2 tablets (1,000 mg total) by mouth 2 (two) times daily. For severe back pain. Patient not taking: Reported on 10/25/2015 09/26/15   Claretta Fraise, MD    Family History Family History  Problem Relation Age of Onset  . Stroke Father   . Heart attack Mother   . Diabetes Brother   . Heart disease Brother   . Early death Sister     Social History Social History  Substance Use Topics  . Smoking status: Never Smoker  . Smokeless tobacco: Current User    Types: Snuff  . Alcohol use No     Allergies   Codeine; Fosamax [alendronate sodium]; Penicillins; Sulfa antibiotics; Tramadol; Boniva [ibandronic acid]; Septra [sulfamethoxazole-trimethoprim]; and Vioxx [rofecoxib]   Review of Systems Review of Systems  Constitutional: Negative for chills, fatigue and fever.  Eyes: Negative for visual disturbance.  Respiratory: Negative for shortness of breath.   Cardiovascular: Negative for chest pain.  Gastrointestinal: Negative for abdominal pain, diarrhea, nausea and vomiting.  Musculoskeletal: Positive for arthralgias and myalgias. Negative for back pain and neck pain.  Skin: Positive for color change and wound.  Neurological: Negative for  dizziness, syncope, weakness, light-headedness, numbness and headaches.  All other systems reviewed and are negative.  Physical Exam Updated Vital Signs BP 148/66 (BP Location: Right Arm)   Pulse (!) 50   Temp 97.7 F (36.5 C) (Oral)   Resp 16   Ht 4\' 8"  (1.422 m)   Wt 89 lb 15.5 oz (40.8 kg)   SpO2 100%   BMI 20.17 kg/m   Physical Exam  Constitutional: She is oriented to person, place, and time. She appears well-developed and well-nourished.  HENT:  Head: Normocephalic and atraumatic.  Mouth/Throat: Oropharynx is clear and moist.  Eyes: EOM are normal. Pupils are equal, round, and reactive to light.  Neck: Normal range of motion. Neck supple.  No posterior midline cervical tenderness  Cardiovascular: Regular rhythm.   Bradycardia  Pulmonary/Chest: Effort normal and breath sounds normal.  Abdominal: Soft. Bowel sounds are normal. There is no tenderness. There is no rebound and no guarding.  Musculoskeletal:  Normal range of motion. She exhibits tenderness. She exhibits no edema.  Patient has bilateral anterior abrasions to both knees. No underlying swelling, bony tenderness or ligamentous instability. Patient has a 5-6 cm chronic appearing ulceration to the left calf. There is a purulent drainage with surrounding erythema and warmth. Very tender to palpation. Patient has a puncture wound to the plantar surface of the right great toe. There is surrounding erythema extending up to the dorsum of the foot. There is no purulent discharge. There is been improvement in erythema from yesterday when compared to the marking of the cellulitis from yesterday.  Neurological: She is alert and oriented to person, place, and time.  Moving all extremities without deficit. Sensation intact.  Skin: Skin is warm and dry. Capillary refill takes less than 2 seconds. No rash noted. No erythema.  Psychiatric: She has a normal mood and affect. Her behavior is normal.  Nursing note and vitals reviewed.  ED  Treatments / Results  Labs (all labs ordered are listed, but only abnormal results are displayed) Labs Reviewed  CBC WITH DIFFERENTIAL/PLATELET - Abnormal; Notable for the following:       Result Value   Platelets 134 (*)    All other components within normal limits  BASIC METABOLIC PANEL - Abnormal; Notable for the following:    Chloride 100 (*)    Glucose, Bld 102 (*)    BUN 25 (*)    Creatinine, Ser 1.30 (*)    Calcium 8.3 (*)    GFR calc non Af Amer 35 (*)    GFR calc Af Amer 40 (*)    All other components within normal limits    EKG  EKG Interpretation  Date/Time:  Tuesday October 25 2015 13:08:27 EDT Ventricular Rate:  48 PR Interval:    QRS Duration: 176 QT Interval:  618 QTC Calculation: 553 R Axis:   -92 Text Interpretation:  Junctional rhythm Right bundle branch block Inferior infarct, age indeterminate Confirmed by Lita Mains  MD, Tyee Vandevoorde ([id number]) on 10/25/2015 1:18:59 PM       Radiology Dg Toe Great Right  Result Date: 10/24/2015 CLINICAL DATA:  Infection great toe EXAM: RIGHT GREAT TOE COMPARISON:  None. FINDINGS: Diffuse soft tissue swelling. No gas in the soft tissues. There is bandage overlying the great toe. Negative for osteomyelitis.  Negative for fracture. Diffuse arterial calcification. IMPRESSION: Diffuse soft tissue swelling of the great toe. Negative for osteomyelitis. Electronically Signed   By: Franchot Gallo M.D.   On: 10/24/2015 17:18    Procedures Procedures  DIAGNOSTIC STUDIES: Oxygen Saturation is 99% on RA, normal by my interpretation.    COORDINATION OF CARE: 12:36 PM Discussed next steps with pt. Pt verbalized understanding and is agreeable with the plan.    Medications Ordered in ED Medications  vancomycin (VANCOCIN) IVPB 1000 mg/200 mL premix (1,000 mg Intravenous New Bag/Given 10/25/15 1308)     Initial Impression / Assessment and Plan / ED Course  I have reviewed the triage vital signs and the nursing notes.  Pertinent labs &  imaging results that were available during my care of the patient were reviewed by me and considered in my medical decision making (see chart for details).  Clinical Course    I personally performed the services described in this documentation, which was scribed in my presence. The recorded information has been reviewed and is accurate.   We'll recheck basic labs and start on IV antibiotics. Will discuss with hospitalist. EKG with bradycardia. Question junctional rhythm.  Appears to have prolonged QT. Will require telemetry monitoring.  Discussed with hospitalist and will admit to telemetry bed. Final Clinical Impressions(s) / ED Diagnoses   Final diagnoses:  Wound infection  Bradycardia    New Prescriptions New Prescriptions   No medications on file     Candace Rice, MD 10/25/15 1436

## 2015-10-25 NOTE — ED Triage Notes (Signed)
Pt was called by PCP this am and told to return to the ED for more IV abx. On the way to the car the pt fell, landed on her knees. Abrasions to both knees noted.

## 2015-10-25 NOTE — Progress Notes (Addendum)
Patient stating that she was going home. Advised the patient and her granddaughter that she had and infection in her right great toe that would worsen if she was to go home without antibiotics. Also that her Potassium level was low and her cardiac enzymes are elevated and that could lead to problems with her heart. Patient is alert and oriented and able to make decisions. Granddaughter at bedside agrees that patient is able to make her own decisions and that if she wanted to go home she would take her home. Discussed AMA paperwork with patient and the risks of further infection and that with her low Potassium it could affect her heart. Patient and Granddaughter verbalized understanding and Patient signed AMA paper. Dr. Marin Comment notified.

## 2015-10-25 NOTE — Telephone Encounter (Signed)
Patient left hospital last night AMA.  Called today to find out why and encourage patient to return to hospital.  She states that she could not stay in the hospital because she had the "fidgets" and she didn't have family that could stay with her.  Discussed the seriousness of her condition and recommended she return to hospital / ER.  She states she does not have anyone to take her this am.  Will consult with her PCP for further recommendations.

## 2015-10-25 NOTE — Progress Notes (Signed)
ANTICOAGULATION CONSULT NOTE - Initial Consult  Pharmacy Consult for COUMADIN (chronic Rx PTA) Indication: atrial fibrillation  Allergies  Allergen Reactions  . Codeine Hives and Shortness Of Breath  . Fosamax [Alendronate Sodium] Hives and Nausea And Vomiting  . Penicillins Hives  . Sulfa Antibiotics Itching, Nausea And Vomiting and Rash  . Tramadol Other (See Comments)    Near syncope  . Boniva [Ibandronic Acid]   . Septra [Sulfamethoxazole-Trimethoprim]   . Vioxx [Rofecoxib]     Patient Measurements: Height: 4\' 8"  (142.2 cm) Weight: 89 lb 15.5 oz (40.8 kg) IBW/kg (Calculated) : 36.3  Vital Signs: Temp: 97.3 F (36.3 C) (10/17 1556) Temp Source: Oral (10/17 1115) BP: 155/56 (10/17 1557) Pulse Rate: 50 (10/17 1556)  Labs:  Recent Labs  10/24/15 2012 10/25/15 1256 10/25/15 1528  HGB 14.0 13.3  --   HCT 41.8 40.3  --   PLT 152 134*  --   LABPROT 30.3*  --  32.8*  INR 2.83  --  3.11  CREATININE 1.50* 1.30*  --   TROPONINI 0.05*  --   --     Estimated Creatinine Clearance: 16.2 mL/min (by C-G formula based on SCr of 1.3 mg/dL (H)).   Medical History: Past Medical History:  Diagnosis Date  . Aortic stenosis   . Atrial fibrillation ([place])   . DVT (deep venous thrombosis) ([place])   . Essential hypertension   . Glaucoma   . History of skin cancer 2016  . Hypercholesterolemia   . PE (pulmonary embolism)     Medications:  Prescriptions Prior to Admission  Medication Sig Dispense Refill Last Dose  . acetaminophen (TYLENOL) 500 MG tablet Take 1 tablet (500 mg total) by mouth every 8 (eight) hours as needed for mild pain.   Past Week at Unknown time  . amLODipine (NORVASC) 2.5 MG tablet Take 1 tablet (2.5 mg total) by mouth daily. 30 tablet 11 10/25/2015 at Unknown time  . brimonidine (ALPHAGAN P) 0.1 % SOLN Apply 1 drop to eye 2 (two) times daily.    10/25/2015 at Unknown time  . denosumab (PROLIA) 60 MG/ML SOLN injection INJECT 60 MG INTO THE SKIN EVERY 6  MONTHS. BRING TO OFFICE FOR ADMINSTRATION 1 each 0 unknown  . furosemide (LASIX) 20 MG tablet Take one tablet daily (Patient taking differently: Take 20 mg by mouth daily. Take one tablet daily) 90 tablet 1 10/25/2015 at Unknown time  . LUMIGAN 0.01 % SOLN INSTILL ONE DROP INTO EACH EYE AT BEDTIME 3 Bottle 0 10/25/2015 at Unknown time  . nitroGLYCERIN (NITROSTAT) 0.4 MG SL tablet Place 1 tablet (0.4 mg total) under the tongue every 5 (five) minutes as needed for chest pain. 25 tablet 3 unknown  . simvastatin (ZOCOR) 40 MG tablet Take 1 tablet (40 mg total) by mouth at bedtime. 90 tablet 1 10/24/2015 at Unknown time  . warfarin (COUMADIN) 2 MG tablet TAKE ONE OR TWO TABLETS BY MOUTH ONCE DAILY AS DIRECTED BY ANTICOAGULATION (Patient taking differently: TAKE ONE TABLET BY MOUTH ONCE DAILY AS DIRECTED BY ANTICOAGULATION) 90 tablet 1 10/24/2015 at 1700  . megestrol (MEGACE) 400 MG/10ML suspension Take 10 mLs (400 mg total) by mouth daily. (Patient not taking: Reported on 10/24/2015) 240 mL 0 Not Taking at Unknown time  . minocycline (MINOCIN) 100 MG capsule Take 1 capsule (100 mg total) by mouth 2 (two) times daily. Take on an empty stomach (Patient not taking: Reported on 10/25/2015) 20 capsule 0 Not Taking at Unknown time  . mupirocin  cream (BACTROBAN) 2 % Apply 1 application topically 2 (two) times daily. (Patient not taking: Reported on 10/25/2015) 15 g 0 Not Taking at Unknown time  . nabumetone (RELAFEN) 500 MG tablet Take 2 tablets (1,000 mg total) by mouth 2 (two) times daily. For severe back pain. (Patient not taking: Reported on 10/25/2015) 120 tablet 2 Not Taking at Unknown time    Assessment: 82yo female on chronic Coumadin PTA for h/o afib.  INR > 3 on admission.  Goal of Therapy:  INR 2-3 Monitor platelets by anticoagulation protocol: Yes   Plan:  HOLD coumadin today due to elevated INR Check INR daily  Hart Robinsons A 10/25/2015,4:15 PM

## 2015-10-25 NOTE — Progress Notes (Signed)
Removed IV, patient tolerated well. I wheeled out to ED entrance since she signed AMA form and was ready to go home.

## 2015-10-25 NOTE — Telephone Encounter (Signed)
I will call her.

## 2015-10-25 NOTE — Addendum Note (Signed)
Addended by: Leeroy Cha on: 10/25/2015 08:17 AM   Modules accepted: Orders

## 2015-10-25 NOTE — H&P (Signed)
History and Physical    Candace [name] [id number] DOB: [birth year] DOA: 10/25/2015  PCP: Chevis Pretty, FNP  Patient coming from: Home  Chief Complaint: Worse foot infection  HPI: Candace [name] is a 82 y.o. female with medical history significant of afib, prior PE with DVT, HLD, HTN, active chewing tobacco who initially sent to emergency department overnight with  Generalized malaise and purulent discharge from right great toe. Patient had been followed by wound care clinic for toe infection related to chronic venous stasis. Patient was initially admitted to the medical service for continued IV antibiotics, however patient elected to sign out [place]. Patient presents to clinic today where she was again referred to the emergency department. In emergency department, patient noted to have a white blood count of 5.2 with blood pressure of 173/85, heart rate 51. On day of admission, patient reportedly had mechanical fall with resultant left lower extremity redness. Patient denies syncope. Family endorses decreased by mouth intake. Patient reports purulent eating expressed from toe wound. Wound has been chronic and managed by wound care as per above.  ED Course: In emergency department, patient was given 1 dose of vancomycin. Of note, patient was given 1 dose of antibiotics prior to leaving [place] overnight. Per ED physician, wound does appear improved. Patient noted be afebrile. Given presenting symptoms, hospital service consulted for consideration for admission  Review of Systems:  Review of Systems  Constitutional: Positive for malaise/fatigue and weight loss. Negative for chills and fever.  HENT: Negative for ear discharge, ear pain and tinnitus.   Eyes: Negative for double vision and photophobia.  Respiratory: Negative for hemoptysis and shortness of breath.   Cardiovascular: Negative for palpitations and orthopnea.  Gastrointestinal: Negative for  nausea and vomiting.  Genitourinary: Negative for frequency and hematuria.  Musculoskeletal: Positive for falls. Negative for back pain and neck pain.  Neurological: Negative for tingling, tremors and loss of consciousness.  Psychiatric/Behavioral: Negative for hallucinations and substance abuse.    Past Medical History:  Diagnosis Date  . Aortic stenosis   . Atrial fibrillation ([place])   . DVT (deep venous thrombosis) ([place])   . Essential hypertension   . Glaucoma   . History of skin cancer 2016  . Hypercholesterolemia   . PE (pulmonary embolism)     Past Surgical History:  Procedure Laterality Date  . CATARACT EXTRACTION    . CHOLECYSTECTOMY    . KYPHOPLASTY    . SKIN CANCER EXCISION Left 2016   neck     reports that she has never smoked. Her smokeless tobacco use includes Snuff. She reports that she does not drink alcohol or use drugs.  Allergies  Allergen Reactions  . Codeine Hives and Shortness Of Breath  . Fosamax [Alendronate Sodium] Hives and Nausea And Vomiting  . Penicillins Hives  . Sulfa Antibiotics Itching, Nausea And Vomiting and Rash  . Tramadol Other (See Comments)    Near syncope  . Boniva [Ibandronic Acid]   . Septra [Sulfamethoxazole-Trimethoprim]   . Vioxx [Rofecoxib]     Family History  Problem Relation Age of Onset  . Stroke Father   . Heart attack Mother   . Diabetes Brother   . Heart disease Brother   . Early death Sister     Prior to Admission medications   Medication Sig Start Date End Date Taking? Authorizing Provider  acetaminophen (TYLENOL) 500 MG tablet Take 1 tablet (500 mg total) by mouth every 8 (eight) hours as needed for  mild pain. 08/14/15  Yes Samuella Cota, MD  amLODipine (NORVASC) 2.5 MG tablet Take 1 tablet (2.5 mg total) by mouth daily. 09/28/15  Yes Minus Breeding, MD  brimonidine (ALPHAGAN P) 0.1 % SOLN Apply 1 drop to eye 2 (two) times daily.    Yes Historical Provider, MD  denosumab (PROLIA) 60 MG/ML SOLN injection  INJECT 60 MG INTO THE SKIN EVERY 6 MONTHS. BRING TO OFFICE FOR ADMINSTRATION 09/12/15  Yes [name] Done, FNP  furosemide (LASIX) 20 MG tablet Take one tablet daily Patient taking differently: Take 20 mg by mouth daily. Take one tablet daily 07/28/15  Yes [name] Done, FNP  LUMIGAN 0.01 % SOLN INSTILL ONE DROP INTO [facility] EYE AT BEDTIME 10/04/15  Yes [name] Done, FNP  nitroGLYCERIN (NITROSTAT) 0.4 MG SL tablet Place 1 tablet (0.4 mg total) under the tongue every 5 (five) minutes as needed for chest pain. 08/18/15  Yes Minus Breeding, MD  simvastatin (ZOCOR) 40 MG tablet Take 1 tablet (40 mg total) by mouth at bedtime. 06/10/15  Yes [name] Done, FNP  warfarin (COUMADIN) 2 MG tablet TAKE ONE OR TWO TABLETS BY MOUTH ONCE DAILY AS DIRECTED BY ANTICOAGULATION Patient taking differently: TAKE ONE TABLET BY MOUTH ONCE DAILY AS DIRECTED BY ANTICOAGULATION 08/24/15  Yes [name] Done, FNP  megestrol (MEGACE) 400 MG/10ML suspension Take 10 mLs (400 mg total) by mouth daily. Patient not taking: Reported on 10/24/2015 09/13/15   [name] Done, FNP  minocycline (MINOCIN) 100 MG capsule Take 1 capsule (100 mg total) by mouth 2 (two) times daily. Take on an empty stomach Patient not taking: Reported on 10/25/2015 10/11/15   Claretta Fraise, MD  mupirocin cream (BACTROBAN) 2 % Apply 1 application topically 2 (two) times daily. Patient not taking: Reported on 10/25/2015 10/07/15   Terald Sleeper, PA-C  nabumetone (RELAFEN) 500 MG tablet Take 2 tablets (1,000 mg total) by mouth 2 (two) times daily. For severe back pain. Patient not taking: Reported on 10/25/2015 09/26/15   Claretta Fraise, MD    Physical Exam: Vitals:   10/25/15 1345 10/25/15 1400 10/25/15 1412 10/25/15 1415  BP:  173/85    Pulse: (!) 49 (!) 51    Resp: 19 23  21   Temp:      TempSrc:      SpO2: 100% 100% 100%   Weight:      Height:        Constitutional: NAD, calm, comfortable Vitals:   10/25/15 1345  10/25/15 1400 10/25/15 1412 10/25/15 1415  BP:  173/85    Pulse: (!) 49 (!) 51    Resp: 19 23  21   Temp:      TempSrc:      SpO2: 100% 100% 100%   Weight:      Height:       Eyes: PERRL, lids and conjunctivae normal ENMT: Mucous membranes are moist. Posterior pharynx clear of any exudate or lesions.Normal dentition.  Neck: normal, supple, no masses, no thyromegaly Respiratory: clear to auscultation bilaterally, no wheezing, no crackles. Normal respiratory effort. No accessory muscle use.  Cardiovascular: Regular rate and rhythm, Systolic murmur auscultated Abdomen: no tenderness, no masses palpated. No hepatosplenomegaly. Bowel sounds positive.  Musculoskeletal: no clubbing / cyanosis. No joint deformity upper and lower extremities. Good ROM, no contractures. Normal muscle tone.  Skin: Patch of poorly diffused erythema over her right foot with surgery at the base of the right great toe, left shin with dressings that are clean dry and intact Neurologic: CN 2-12 grossly  intact. Sensation intact, DTR normal. Strength 5/5 in all 4.  Psychiatric: Normal judgment and insight. Alert and oriented x 3. Normal mood.    Labs on Admission: I have personally reviewed following labs and imaging studies  CBC:  Recent Labs Lab 10/24/15 2012 10/25/15 1256  WBC 6.2 5.2  NEUTROABS 4.6 3.8  HGB 14.0 13.3  HCT 41.8 40.3  MCV 85.7 86.7  PLT 152 [id number]*   Basic Metabolic Panel:  Recent Labs Lab 10/24/15 2012 10/25/15 1256  NA 136 137  K 2.8* 4.0  CL 98* 100*  CO2 28 28  GLUCOSE 103* 102*  BUN 27* 25*  CREATININE 1.50* 1.30*  CALCIUM 8.4* 8.3*  MG 1.7  --    GFR: Estimated Creatinine Clearance: 16.2 mL/min (by C-G formula based on SCr of 1.3 mg/dL (H)). Liver Function Tests:  Recent Labs Lab 10/24/15 2012  AST 32  ALT 19  ALKPHOS 79  BILITOT 0.9  PROT 6.0*  ALBUMIN 3.2*   No results for input(s): LIPASE, AMYLASE in the last 168 hours. No results for input(s): AMMONIA in the  last 168 hours. Coagulation Profile:  Recent Labs Lab 10/19/15 1715 10/24/15 2012  INR 2.6* 2.83   Cardiac Enzymes:  Recent Labs Lab 10/24/15 2012  TROPONINI 0.05*   BNP (last 3 results) No results for input(s): PROBNP in the last 8760 hours. HbA1C: No results for input(s): HGBA1C in the last 72 hours. CBG: No results for input(s): GLUCAP in the last 168 hours. Lipid Profile: No results for input(s): CHOL, HDL, LDLCALC, TRIG, CHOLHDL, LDLDIRECT in the last 72 hours. Thyroid Function Tests: No results for input(s): TSH, T4TOTAL, FREET4, T3FREE, THYROIDAB in the last 72 hours. Anemia Panel: No results for input(s): VITAMINB12, FOLATE, FERRITIN, TIBC, IRON, RETICCTPCT in the last 72 hours. Urine analysis:    Component Value Date/Time   COLORURINE YELLOW 06/06/2009 2144   APPEARANCEUR Clear 08/30/2015 1624   LABSPEC 1.015 06/06/2009 2144   PHURINE 5.5 06/06/2009 2144   GLUCOSEU Negative 08/30/2015 1624   HGBUR TRACE (A) 06/06/2009 2144   BILIRUBINUR Negative 08/30/2015 1624   KETONESUR NEGATIVE 06/06/2009 2144   PROTEINUR Trace 08/30/2015 1624   PROTEINUR NEGATIVE 06/06/2009 2144   UROBILINOGEN 0.2 06/06/2009 2144   NITRITE Negative 08/30/2015 1624   NITRITE NEGATIVE 06/06/2009 2144   LEUKOCYTESUR Negative 08/30/2015 1624   Sepsis Labs: !!!!!!!!!!!!!!!!!!!!!!!!!!!!!!!!!!!!!!!!!!!! @LABRCNTIP (procalcitonin:4,lacticidven:4) ) Recent Results (from the past 240 hour(s))  Wound or Superficial Culture     Status: None (Preliminary result)   Collection Time: 10/24/15  8:59 PM  Result Value Ref Range Status   Specimen Description WOUND RIGHT TOE  Final   Special Requests NONE  Final   Gram Stain PENDING  Incomplete   Culture PENDING  Incomplete   Report Status PENDING  Incomplete     Radiological Exams on Admission: Dg Toe Great Right  Result Date: 10/24/2015 CLINICAL DATA:  Infection great toe EXAM: RIGHT GREAT TOE COMPARISON:  None. FINDINGS: Diffuse soft tissue  swelling. No gas in the soft tissues. There is bandage overlying the great toe. Negative for osteomyelitis.  Negative for fracture. Diffuse arterial calcification. IMPRESSION: Diffuse soft tissue swelling of the great toe. Negative for osteomyelitis. Electronically Signed   By: Franchot Gallo M.D.   On: 10/24/2015 17:18    EKG: Independently reviewed. Right bundle branch block, motion artifact  Assessment/Plan Principal Problem:   Cellulitis of foot, right Active Problems:   Atrial fibrillation (HCC)   HTN (hypertension)   Long term current  use of anticoagulant therapy   Tobacco use disorder   Hyperlipidemia   Wound infection   Cellulitis   1. Cellulitis of the right foot 1. Followed by wound care as outpatient 2. Family endorses purulent drainage from wound, not currently draining 3. Patient afebrile with no leukocytosis 4. We'll continue patient empirically on vancomycin 5. We'll check blood cultures 6. Admit patient to med telemetry given her a bundle branch block seen on EKG 2. Atrial fibrillation 1. Rate controlled at present 2. We'll continue home regimen 3. We'll continue to min per pharmacy dosing 3. Hypertension 1. Blood pressure currently stable, albeit suboptimally controlled 2. We'll continue home regimen for now, plan to titrate accordingly 4. Chronic Coumadin therapy 1. We'll continue Coumadin per pharmacy dosing per above 5. Hyperlipidemia 1. Not on statin 6. Tobacco abuse 1. Patient continues to do snuff 2. Cessation was done at bedside  DVT prophylaxis: Therapeutic Coumadin  Code Status: DO NOT RESUSCITATE, confirmed with family as well as patient Family Communication: Patient in room, family at bedside  Disposition Plan: Uncertain at this time  Consults called:  Admission status: MedSurg, telemetry , observation status  Judye Lorino, Orpah Melter MD [facility] Pager [phone]  If 7PM-7AM, please contact night-coverage [email or web address] Password  [email or web address]  10/25/2015, 3:06 PM

## 2015-10-25 NOTE — Addendum Note (Signed)
Addended by: Leeroy Cha on: 10/25/2015 08:59 AM   Modules accepted: Orders

## 2015-10-25 NOTE — Progress Notes (Signed)
Pharmacy Antibiotic Note  Candace [name] is a 82 y.o. female admitted on 10/25/2015 with cellulitis.  Pharmacy has been consulted for Vancomycin dosing. Vancomycin 1 GM IV given in ED today (Note also Vancomycin 1 GM IV given in ED last evening, AMA)  Plan: Vancomycin 500 mg IV every 48 hours Vancomycin trough at steady state Monitor renal, reduced renal function Labs per protocol  Height: 4\' 8"  (142.2 cm) Weight: 89 lb 15.5 oz (40.8 kg) IBW/kg (Calculated) : 36.3  Temp (24hrs), Avg:97.5 F (36.4 C), Min:97.1 F (36.2 C), Max:97.7 F (36.5 C)   Recent Labs Lab 10/24/15 2012 10/25/15 1256  WBC 6.2 5.2  CREATININE 1.50* 1.30*    Estimated Creatinine Clearance: 16.2 mL/min (by C-G formula based on SCr of 1.3 mg/dL (H)).    Allergies  Allergen Reactions  . Codeine Hives and Shortness Of Breath  . Fosamax [Alendronate Sodium] Hives and Nausea And Vomiting  . Penicillins Hives  . Sulfa Antibiotics Itching, Nausea And Vomiting and Rash  . Tramadol Other (See Comments)    Near syncope  . Boniva [Ibandronic Acid]   . Septra [Sulfamethoxazole-Trimethoprim]   . Vioxx [Rofecoxib]     Antimicrobials this admission: Vancomycin 10/17 >>    Dose adjustments this admission:    Thank you for allowing pharmacy to be a part of this patient's care.  Chriss Czar 10/25/2015 3:10 PM

## 2015-10-25 NOTE — ED Notes (Signed)
Pt eating meal that family brought her at this time.

## 2015-10-26 ENCOUNTER — Telehealth (HOSPITAL_COMMUNITY): Payer: Self-pay

## 2015-10-26 ENCOUNTER — Telehealth (HOSPITAL_COMMUNITY): Payer: Self-pay | Admitting: Physical Therapy

## 2015-10-26 ENCOUNTER — Telehealth: Payer: Self-pay | Admitting: Nurse Practitioner

## 2015-10-26 DIAGNOSIS — L03115 Cellulitis of right lower limb: Secondary | ICD-10-CM | POA: Diagnosis not present

## 2015-10-26 LAB — CBC
HCT: 34.7 % — ABNORMAL LOW (ref 36.0–46.0)
Hemoglobin: 11.4 g/dL — ABNORMAL LOW (ref 12.0–15.0)
MCH: 28.8 pg (ref 26.0–34.0)
MCHC: 32.9 g/dL (ref 30.0–36.0)
MCV: 87.6 fL (ref 78.0–100.0)
PLATELETS: 115 10*3/uL — AB (ref 150–400)
RBC: 3.96 MIL/uL (ref 3.87–5.11)
RDW: 13.5 % (ref 11.5–15.5)
WBC: 5 10*3/uL (ref 4.0–10.5)

## 2015-10-26 LAB — COMPREHENSIVE METABOLIC PANEL
ALK PHOS: 61 U/L (ref 38–126)
ALT: 18 U/L (ref 14–54)
AST: 27 U/L (ref 15–41)
Albumin: 2.4 g/dL — ABNORMAL LOW (ref 3.5–5.0)
Anion gap: 7 (ref 5–15)
BILIRUBIN TOTAL: 0.9 mg/dL (ref 0.3–1.2)
BUN: 27 mg/dL — AB (ref 6–20)
CALCIUM: 8.1 mg/dL — AB (ref 8.9–10.3)
CHLORIDE: 99 mmol/L — AB (ref 101–111)
CO2: 29 mmol/L (ref 22–32)
Creatinine, Ser: 1.35 mg/dL — ABNORMAL HIGH (ref 0.44–1.00)
GFR calc Af Amer: 39 mL/min — ABNORMAL LOW (ref >60)
GFR, EST NON AFRICAN AMERICAN: 33 mL/min — AB (ref >60)
Glucose, Bld: 85 mg/dL (ref 65–99)
Potassium: 3.3 mmol/L — ABNORMAL LOW (ref 3.5–5.1)
Sodium: 135 mmol/L (ref 135–145)
Total Protein: 4.4 g/dL — ABNORMAL LOW (ref 6.5–8.1)

## 2015-10-26 LAB — PROTIME-INR
INR: 3.72
Prothrombin Time: 37.7 seconds — ABNORMAL HIGH (ref 11.4–15.2)

## 2015-10-26 MED ORDER — BAG BALM OINTMENT
TOPICAL_OINTMENT | CUTANEOUS | Status: DC | PRN
  Administered 2015-10-26: 10:00:00 via TOPICAL
  Filled 2015-10-26: qty 300

## 2015-10-26 MED ORDER — DOXYCYCLINE HYCLATE 100 MG PO CAPS: 100.0000 mg | ORAL_CAPSULE | Freq: Two times a day (BID) | ORAL | 0 refills | Status: DC

## 2015-10-26 NOTE — Care Management Note (Signed)
Case Management Note  Patient Details  Name: Candace [name] MRN: [id number] Date of Birth: 1922-12-28  Subjective/Objective:                  Pt is from lives alone and has a granddaughter who stays with her at night and two daughters who assist with appointments and medications. Pt has been going to OP wound center in [place] and was supposed to have appointment today. At pt's request OP wound center called and left VM pt needed to reschedule appointment, request made they f/u with pt's daughter. Pt discharging home today with self care.   Action/Plan: No CM needs.   Expected Discharge Date:  10/26/15               Expected Discharge Plan:  Home/Self Care  In-House Referral:  NA  Discharge planning Services  CM Consult  Post Acute Care Choice:  NA Choice offered to:  NA   Status of Service:  Completed, signed off   Sherald Barge, RN 10/26/2015, 1:28 PM

## 2015-10-26 NOTE — Telephone Encounter (Signed)
INR in hospital this am around 4am was 3.7.  Told patient's daughter to hold warfarin today and tomorrow and will get follow up appt for Friday.

## 2015-10-26 NOTE — Telephone Encounter (Signed)
10/18 Candace [name] left [place] a message to reschedule her appt so I called and left a message to get Ms. Banbury back on the schedule

## 2015-10-26 NOTE — Progress Notes (Signed)
Initial Nutrition Assessment  DOCUMENTATION CODES:   Severe malnutrition in context of chronic illness  INTERVENTION:   - Will continue Ensure Enlive oral nutrition supplement BID. Each provides 350 kcal and 20 grams protein. - Encourage PO intake. - Will continue to monitor for nutrition-related needs.  NUTRITION DIAGNOSIS:   Malnutrition related to chronic illness as evidenced by moderate depletion of body fat, severe depletion of muscle mass, percent weight loss of 6.5% in 1 month.  GOAL:   Patient will meet greater than or equal to 90% of their needs  MONITOR:   PO intake, Supplement acceptance, Weight trends, Labs  REASON FOR ASSESSMENT:   Malnutrition Screening Tool   ASSESSMENT:   82 y.o. female with medical history significant of afib, prior PE with DVT, HLD, HTN, active chewing tobacco who initially sent to emergency department overnight with  Generalized malaise and purulent discharge from right great toe. Patient had been followed by wound care clinic for toe infection related to chronic venous stasis. Patient was initially admitted to the medical service for continued IV antibiotics, however patient elected to sign out [place]. Patient presents to clinic today where she was again referred to the emergency department. In emergency department, patient noted to have a white blood count of 5.2 with blood pressure of 173/85, heart rate 51. On day of admission, patient reportedly had mechanical fall with resultant left lower extremity redness. Patient denies syncope. Family endorses decreased by mouth intake.  Noted discharge summary.  Spoke with pt at bedside who reports poor appetite but good PO intake. States she has to "force myself to eat" sometimes. Pt reports eating 2-3 meals per day PTA. A meal might include snap beans, cornbread, creamed potatoes, and macaroni and cheese. Pt rarely eats meat. Pt reports consuming toast with jelly for breakfast this AM;  reports this is a typical breakfast. Pt does not cook but has food brought to her.  Pt reports trying Ensure Enlive oral nutrition supplement. Pt states she has enjoyed the Ensure [place] provided to her during current hospitalization. Will continue.  Per chart, pt has lost 6# in past month (6.5%, significant for timeframe). Pt reports UBW is 90#.  Pt denies any issues chewing, swallowing, or ambulating. Denies taking vitamins.  Medications reviewed.  Labs reviewed and include low potassium (3.3 mmol/L), low chloride (99 mmol/L), elevated BUN (27 mg/dL), elevated creatinine (1.35 mg/dL), low calcium (8.1 mg/dL)  NFPE: Exam completed. Moderate fat depletion, moderate to severe muscle depletion, and no edema noted.  Diet Order:  Diet Heart Room service appropriate? Yes; Fluid consistency: Thin  Skin:  Wound (see comment) (cellulitis to rt great toe)  Last BM:  10/25/15  Height:   Ht Readings from Last 1 Encounters:  10/25/15 4\' 3"  (1.295 m)    Weight:   Wt Readings from Last 1 Encounters:  10/25/15 87 lb 1.6 oz (39.5 kg)    Ideal Body Weight:  25 kg  BMI:  Body mass index is 23.54 kg/m.  Estimated Nutritional Needs:   Kcal:  1100-1200 (28-30 kcal/kg)  Protein:  47-55 grams (1.2-1.4 g/kg)  Fluid:  >/= 1.2 L  EDUCATION NEEDS:   No education needs identified at this time  Jeb Levering Dietetic Intern Pager Number: [phone]

## 2015-10-26 NOTE — Care Management Obs Status (Signed)
MEDICARE OBSERVATION STATUS NOTIFICATION   Patient Details  Name: Candace [name] MRN: [id number] Date of Birth: Feb 10, 1922   Medicare Observation Status Notification Given:  Yes    Sherald Barge, RN 10/26/2015, 1:28 PM

## 2015-10-26 NOTE — Discharge Summary (Signed)
Physician Discharge Summary  [name] [id number] DOB: [birth year] DOA: 10/25/2015  PCP: Chevis Pretty, FNP  Admit date: 10/25/2015 Discharge date: 10/26/2015  Time spent: 45 minutes  Recommendations for Outpatient Follow-up:  -We'll be discharged home today. -We'll be sent home on a seven-day course of doxycycline for her right great toe and surrounding cellulitis. - Advised to follow-up with primary care provider in 2 weeks. - Discharge Diagnoses:  Principal Problem:   Cellulitis of foot, right Active Problems:   Atrial fibrillation (HCC)   HTN (hypertension)   Long term current use of anticoagulant therapy   Tobacco use disorder   Hyperlipidemia   Wound infection   Cellulitis   Discharge Condition: Stable and improved   Filed Weights   10/25/15 1117 10/25/15 1629  Weight: 40.8 kg (89 lb 15.5 oz) 39.5 kg (87 lb 1.6 oz)    History of present illness:  As per Dr. Wyline Copas on 10/17: Candace [name] is a 82 y.o. female with medical history significant of afib, prior PE with DVT, HLD, HTN, active chewing tobacco who initially sent to emergency department overnight with  Generalized malaise and purulent discharge from right great toe. Patient had been followed by wound care clinic for toe infection related to chronic venous stasis. Patient was initially admitted to the medical service for continued IV antibiotics, however patient elected to sign out [place]. Patient presents to clinic today where she was again referred to the emergency department. In emergency department, patient noted to have a white blood count of 5.2 with blood pressure of 173/85, heart rate 51. On day of admission, patient reportedly had mechanical fall with resultant left lower extremity redness. Patient denies syncope. Family endorses decreased by mouth intake. Patient reports purulent eating expressed from toe wound. Wound has been chronic and managed by wound care as per  above.  ED Course: In emergency department, patient was given 1 dose of vancomycin. Of note, patient was given 1 dose of antibiotics prior to leaving [place] overnight. Per ED physician, wound does appear improved. Patient noted be afebrile. Given presenting symptoms, hospital service consulted for consideration for admission  Hospital Course:   Right foot cellulitis -Family endorses purulent drainage from the right toe/nail area, currently not draining. -Patient remains afebrile and without leukocytosis, received a dose of vancomycin, cellulitis has greatly recessed from margins marked in ED yesterday. -Culture data remains negative to date. -Plan to discharge home today on a seven-day course of doxycycline.  -Advised to follow-up with her wound care center as previously planned.   Rest of chronic conditions have been stable and her medications have not been altered  Procedures:  None   Consultations:  None   Discharge Instructions  Discharge Instructions    Increase activity slowly    Complete by:  As directed        Medication List    STOP taking these medications   megestrol 400 MG/10ML suspension Commonly known as:  MEGACE   minocycline 100 MG capsule Commonly known as:  MINOCIN   mupirocin cream 2 % Commonly known as:  BACTROBAN   nabumetone 500 MG tablet Commonly known as:  RELAFEN     TAKE these medications   acetaminophen 500 MG tablet Commonly known as:  TYLENOL Take 1 tablet (500 mg total) by mouth every 8 (eight) hours as needed for mild pain.   amLODipine 2.5 MG tablet Commonly known as:  NORVASC Take 1 tablet (2.5 mg total) by  mouth daily.   brimonidine 0.1 % Soln Commonly known as:  ALPHAGAN P Apply 1 drop to eye 2 (two) times daily.   denosumab 60 MG/ML Soln injection Commonly known as:  PROLIA INJECT 60 MG INTO THE SKIN EVERY 6 MONTHS. BRING TO OFFICE FOR ADMINSTRATION   doxycycline 100 MG capsule Commonly known as:   VIBRAMYCIN Take 1 capsule (100 mg total) by mouth 2 (two) times daily.   furosemide 20 MG tablet Commonly known as:  LASIX Take one tablet daily What changed:  how much to take  how to take this  when to take this  additional instructions   LUMIGAN 0.01 % Soln Generic drug:  bimatoprost INSTILL ONE DROP INTO EACH EYE AT BEDTIME   nitroGLYCERIN 0.4 MG SL tablet Commonly known as:  NITROSTAT Place 1 tablet (0.4 mg total) under the tongue every 5 (five) minutes as needed for chest pain.   simvastatin 40 MG tablet Commonly known as:  ZOCOR Take 1 tablet (40 mg total) by mouth at bedtime.   warfarin 2 MG tablet Commonly known as:  COUMADIN TAKE ONE OR TWO TABLETS BY MOUTH ONCE DAILY AS DIRECTED BY ANTICOAGULATION What changed:  See the new instructions.      Allergies  Allergen Reactions  . Codeine Hives and Shortness Of Breath  . Fosamax [Alendronate Sodium] Hives and Nausea And Vomiting  . Penicillins Hives  . Sulfa Antibiotics Itching, Nausea And Vomiting and Rash  . Tramadol Other (See Comments)    Near syncope  . Boniva [Ibandronic Acid]   . Septra [Sulfamethoxazole-Trimethoprim]   . Vioxx [Rofecoxib]    Follow-up Information    [name] Done, FNP. Schedule an appointment as soon as possible for a visit in 2 week(s).   Specialty:  Family Medicine Contact information: [place] [address] [phone]            The results of significant diagnostics from this hospitalization (including imaging, microbiology, ancillary and laboratory) are listed below for reference.    Significant Diagnostic Studies: Dg Toe Great Right  Result Date: 10/24/2015 CLINICAL DATA:  Infection great toe EXAM: RIGHT GREAT TOE COMPARISON:  None. FINDINGS: Diffuse soft tissue swelling. No gas in the soft tissues. There is bandage overlying the great toe. Negative for osteomyelitis.  Negative for fracture. Diffuse arterial calcification. IMPRESSION:  Diffuse soft tissue swelling of the great toe. Negative for osteomyelitis. Electronically Signed   By: Franchot Gallo M.D.   On: 10/24/2015 17:18    Microbiology: Recent Results (from the past 240 hour(s))  Wound or Superficial Culture     Status: None (Preliminary result)   Collection Time: 10/24/15  8:59 PM  Result Value Ref Range Status   Specimen Description WOUND RIGHT TOE  Final   Special Requests NONE  Final   Gram Stain   Final    RARE WBC PRESENT, PREDOMINANTLY PMN RARE GRAM NEGATIVE RODS RARE GRAM POSITIVE COCCI IN CLUSTERS Performed at [facility]    Culture PENDING  Incomplete   Report Status PENDING  Incomplete  Culture, blood (routine x 2)     Status: None (Preliminary result)   Collection Time: 10/25/15  3:17 PM  Result Value Ref Range Status   Specimen Description BLOOD RIGHT ANTECUBITAL  Final   Special Requests BOTTLES DRAWN AEROBIC AND ANAEROBIC 6CC  Final   Culture NO GROWTH < 24 HOURS  Final   Report Status PENDING  Incomplete  Culture, blood (routine x 2)  Status: None (Preliminary result)   Collection Time: 10/25/15  3:23 PM  Result Value Ref Range Status   Specimen Description BLOOD LEFT ANTECUBITAL  Final   Special Requests BOTTLES DRAWN AEROBIC AND ANAEROBIC 6CC  Final   Culture NO GROWTH < 24 HOURS  Final   Report Status PENDING  Incomplete     Labs: Basic Metabolic Panel:  Recent Labs Lab 10/24/15 2012 10/25/15 1256 10/26/15 0454  NA 136 137 135  K 2.8* 4.0 3.3*  CL 98* 100* 99*  CO2 28 28 29   GLUCOSE 103* 102* 85  BUN 27* 25* 27*  CREATININE 1.50* 1.30* 1.35*  CALCIUM 8.4* 8.3* 8.1*  MG 1.7  --   --    Liver Function Tests:  Recent Labs Lab 10/24/15 2012 10/26/15 0454  AST 32 27  ALT 19 18  ALKPHOS 79 61  BILITOT 0.9 0.9  PROT 6.0* 4.4*  ALBUMIN 3.2* 2.4*   No results for input(s): LIPASE, AMYLASE in the last 168 hours. No results for input(s): AMMONIA in the last 168 hours. CBC:  Recent Labs Lab  10/24/15 2012 10/25/15 1256 10/26/15 0454  WBC 6.2 5.2 5.0  NEUTROABS 4.6 3.8  --   HGB 14.0 13.3 11.4*  HCT 41.8 40.3 34.7*  MCV 85.7 86.7 87.6  PLT 152 134* 115*   Cardiac Enzymes:  Recent Labs Lab 10/24/15 2012  TROPONINI 0.05*   BNP: BNP (last 3 results)  Recent Labs  08/08/15 1548 08/13/15 2210  BNP 940.0* 1,501.0*    ProBNP (last 3 results) No results for input(s): PROBNP in the last 8760 hours.  CBG: No results for input(s): GLUCAP in the last 168 hours.     SignedLelon Frohlich  [facility] Pager: [phone] 10/26/2015, 10:50 AM

## 2015-10-26 NOTE — Progress Notes (Signed)
Pt's IV catheter removed and intact. Pt's IV site clean dry and intact. Discharge instructions including medications and follow up appointments were reviewed and discussed with patient's daughter. All questions were answered and no further questions at this time. Pt's daughter verbalized understanding of discharge instructions including medications and follow up appointments. Pt in stable condition and in no acute distress at time of discharge. Pt escorted by nurse tech.

## 2015-10-26 NOTE — Progress Notes (Signed)
ANTICOAGULATION CONSULT NOTE - follow up  Pharmacy Consult for COUMADIN (chronic Rx PTA) Indication: atrial fibrillation  Allergies  Allergen Reactions  . Codeine Hives and Shortness Of Breath  . Fosamax [Alendronate Sodium] Hives and Nausea And Vomiting  . Penicillins Hives  . Sulfa Antibiotics Itching, Nausea And Vomiting and Rash  . Tramadol Other (See Comments)    Near syncope  . Boniva [Ibandronic Acid]   . Septra [Sulfamethoxazole-Trimethoprim]   . Vioxx [Rofecoxib]    Patient Measurements: Height: 4\' 3"  (129.5 cm) Weight: 87 lb 1.6 oz (39.5 kg) IBW/kg (Calculated) : 24.8  Vital Signs: Temp: 97.9 F (36.6 C) (10/18 0423) Temp Source: Oral (10/18 0423) BP: 110/52 (10/18 0423) Pulse Rate: 44 (10/18 0423)  Labs:  Recent Labs  10/24/15 2012 10/25/15 1256 10/25/15 1528 10/26/15 0454  HGB 14.0 13.3  --  11.4*  HCT 41.8 40.3  --  34.7*  PLT 152 134*  --  115*  LABPROT 30.3*  --  32.8* 37.7*  INR 2.83  --  3.11 3.72  CREATININE 1.50* 1.30*  --  1.35*  TROPONINI 0.05*  --   --   --    Estimated Creatinine Clearance: 13.2 mL/min (by C-G formula based on SCr of 1.35 mg/dL (H)).  Medical History: Past Medical History:  Diagnosis Date  . Aortic stenosis   . Atrial fibrillation ([place])   . DVT (deep venous thrombosis) ([place])   . Essential hypertension   . Glaucoma   . History of skin cancer 2016  . Hypercholesterolemia   . PE (pulmonary embolism)    Medications:  Prescriptions Prior to Admission  Medication Sig Dispense Refill Last Dose  . acetaminophen (TYLENOL) 500 MG tablet Take 1 tablet (500 mg total) by mouth every 8 (eight) hours as needed for mild pain.   Past Week at Unknown time  . amLODipine (NORVASC) 2.5 MG tablet Take 1 tablet (2.5 mg total) by mouth daily. 30 tablet 11 10/25/2015 at Unknown time  . brimonidine (ALPHAGAN P) 0.1 % SOLN Apply 1 drop to eye 2 (two) times daily.    10/25/2015 at Unknown time  . denosumab (PROLIA) 60 MG/ML SOLN injection  INJECT 60 MG INTO THE SKIN EVERY 6 MONTHS. BRING TO OFFICE FOR ADMINSTRATION 1 each 0 unknown  . furosemide (LASIX) 20 MG tablet Take one tablet daily (Patient taking differently: Take 20 mg by mouth daily. Take one tablet daily) 90 tablet 1 10/25/2015 at Unknown time  . LUMIGAN 0.01 % SOLN INSTILL ONE DROP INTO EACH EYE AT BEDTIME 3 Bottle 0 10/25/2015 at Unknown time  . nitroGLYCERIN (NITROSTAT) 0.4 MG SL tablet Place 1 tablet (0.4 mg total) under the tongue every 5 (five) minutes as needed for chest pain. 25 tablet 3 unknown  . simvastatin (ZOCOR) 40 MG tablet Take 1 tablet (40 mg total) by mouth at bedtime. 90 tablet 1 10/24/2015 at Unknown time  . warfarin (COUMADIN) 2 MG tablet TAKE ONE OR TWO TABLETS BY MOUTH ONCE DAILY AS DIRECTED BY ANTICOAGULATION (Patient taking differently: TAKE ONE TABLET BY MOUTH ONCE DAILY AS DIRECTED BY ANTICOAGULATION) 90 tablet 1 10/24/2015 at 1700  . megestrol (MEGACE) 400 MG/10ML suspension Take 10 mLs (400 mg total) by mouth daily. (Patient not taking: Reported on 10/24/2015) 240 mL 0 Not Taking at Unknown time  . minocycline (MINOCIN) 100 MG capsule Take 1 capsule (100 mg total) by mouth 2 (two) times daily. Take on an empty stomach (Patient not taking: Reported on 10/25/2015) 20 capsule 0 Not  Taking at Unknown time  . mupirocin cream (BACTROBAN) 2 % Apply 1 application topically 2 (two) times daily. (Patient not taking: Reported on 10/25/2015) 15 g 0 Not Taking at Unknown time  . nabumetone (RELAFEN) 500 MG tablet Take 2 tablets (1,000 mg total) by mouth 2 (two) times daily. For severe back pain. (Patient not taking: Reported on 10/25/2015) 120 tablet 2 Not Taking at Unknown time   Assessment: 82yo female on chronic Coumadin PTA for h/o afib.  INR > 3.   Goal of Therapy:  INR 2-3 Monitor platelets by anticoagulation protocol: Yes   Plan:  HOLD coumadin today due to elevated INR Check INR daily  Hart Robinsons A 10/26/2015,10:35 AM

## 2015-10-26 NOTE — Telephone Encounter (Signed)
Daughter called to schedule apptment, gave her an apptment for wound tx tomorrow. NF 10/26/15

## 2015-10-27 ENCOUNTER — Encounter: Payer: Self-pay | Admitting: Family Medicine

## 2015-10-27 ENCOUNTER — Ambulatory Visit (INDEPENDENT_AMBULATORY_CARE_PROVIDER_SITE_OTHER): Payer: Medicare Other | Admitting: Family Medicine

## 2015-10-27 ENCOUNTER — Ambulatory Visit (HOSPITAL_COMMUNITY): Payer: Medicare Other | Admitting: Physical Therapy

## 2015-10-27 VITALS — BP 158/72 | HR 51 | Temp 97.0°F | Ht <= 58 in | Wt 87.0 lb

## 2015-10-27 DIAGNOSIS — M79662 Pain in left lower leg: Secondary | ICD-10-CM

## 2015-10-27 DIAGNOSIS — I4891 Unspecified atrial fibrillation: Secondary | ICD-10-CM | POA: Diagnosis not present

## 2015-10-27 DIAGNOSIS — S91141D Puncture wound with foreign body of right great toe without damage to nail, subsequent encounter: Secondary | ICD-10-CM

## 2015-10-27 DIAGNOSIS — M79661 Pain in right lower leg: Secondary | ICD-10-CM

## 2015-10-27 DIAGNOSIS — R262 Difficulty in walking, not elsewhere classified: Secondary | ICD-10-CM

## 2015-10-27 DIAGNOSIS — Z86718 Personal history of other venous thrombosis and embolism: Secondary | ICD-10-CM | POA: Diagnosis not present

## 2015-10-27 DIAGNOSIS — L97223 Non-pressure chronic ulcer of left calf with necrosis of muscle: Secondary | ICD-10-CM

## 2015-10-27 DIAGNOSIS — Z7901 Long term (current) use of anticoagulants: Secondary | ICD-10-CM

## 2015-10-27 DIAGNOSIS — I82401 Acute embolism and thrombosis of unspecified deep veins of right lower extremity: Secondary | ICD-10-CM | POA: Diagnosis not present

## 2015-10-27 LAB — AEROBIC CULTURE W GRAM STAIN (SUPERFICIAL SPECIMEN)

## 2015-10-27 LAB — AEROBIC CULTURE  (SUPERFICIAL SPECIMEN)

## 2015-10-27 LAB — COAGUCHEK XS/INR WAIVED
INR: 2.2 — ABNORMAL HIGH (ref 0.9–1.1)
Prothrombin Time: 26.4 s

## 2015-10-27 NOTE — Therapy (Signed)
[place], [place], [address] Phone: [phone]   Fax:  [phone]  Wound Care Therapy  Patient Details  Name: Candace [name] MRN: [id number] Date of Birth: 06/22/1922 Referring Provider: Particia Nearing  Encounter Date: 10/27/2015      Candace [name] End of Session - 10/27/15 1558    Visit Number 2   Number of Visits 14   Date for Candace [name] Re-Evaluation 11/23/15   Authorization Type [employer] medicare   Authorization - Visit Number 2   Authorization - Number of Visits 10   Candace [name] Start Time 1300   Candace [name] Stop Time 1346   Candace [name] Time Calculation (min) 46 min   Activity Tolerance Patient limited by pain   Behavior During Therapy [facility] for tasks assessed/performed      Past Medical History:  Diagnosis Date  . Aortic stenosis   . Atrial fibrillation ([place])   . DVT (deep venous thrombosis) ([place])   . Essential hypertension   . Glaucoma   . History of skin cancer 2016  . Hypercholesterolemia   . PE (pulmonary embolism)     Past Surgical History:  Procedure Laterality Date  . CATARACT EXTRACTION    . CHOLECYSTECTOMY    . KYPHOPLASTY    . SKIN CANCER EXCISION Left 2016   neck    There were no vitals filed for this visit.                  Wound Therapy - 10/27/15 1425    Subjective Ms.  [name] states that she is having quite a bit of pain    Patient and Family Stated Goals Wounds to heal    Date of Onset 09/23/15   Prior Treatments peroxide to Lt wound and antibiotic.    Pain Assessment 0-10   Pain Score 9    Pain Type Acute pain   Pain Location Leg   Pain Orientation Left   Pain Intervention(s) --  compression to decrease varicosity pain   Evaluation and Treatment Procedures Explained to Patient/Family Yes   Evaluation and Treatment Procedures agreed to   Wound Properties Date First Assessed: 10/24/15 Time First Assessed: [id number] Wound Type: Puncture Location: Toe (Comment  which one) Location Orientation: Right Wound  Description (Comments): plantar aspect of great toe    Dressing Type None   Dressing Status None   Dressing Change Frequency PRN   Site / Wound Assessment Yellow   % Wound base Red or Granulating 0%   % Wound base Yellow 100%   Peri-wound Assessment Edema;Erythema (blanchable)  calloused   Wound Length (cm) 1.5 cm   Wound Width (cm) 1.8 cm   Wound Depth (cm) --  at least 1.5 cm   Drainage Amount Minimal   Drainage Description Serous;Purulent;Green   Treatment Cleansed;Debridement (Selective);Hydrotherapy (Pulse lavage)   Wound Properties Date First Assessed: 10/24/15 Time First Assessed: 1615 Wound Type: Other (Comment) Location: Leg Location Orientation: Left;Posterior;Lower Wound Description (Comments): dry Present on Admission: Yes   Dressing Type None   Site / Wound Assessment Dry;Black   % Wound base Red or Granulating 0%   % Wound base Black 100%   Peri-wound Assessment Intact   Wound Length (cm) 2.8 cm   Wound Width (cm) 3 cm   Drainage Amount Scant  anticipate now with debridement wound will drain    Treatment Cleansed;Debridement (Selective);Hydrotherapy (Pulse lavage)   Pulsed lavage therapy - wound location wound sites   Pulsed Lavage with Suction (psi)  4 psi   Pulsed Lavage with Suction - Normal Saline Used 1000 mL   Pulsed Lavage Tip Tip with splash shield   Selective Debridement - Location --  entire wound   Selective Debridement - Tools Used Forceps;Scalpel;Scissors   Selective Debridement - Tissue Removed necrotic eschar from Lt wound; necrotic tissue from Rt great toe    Wound Therapy - Clinical Statement Candace [name] is a 82 yo with CHF who has two chronic wounds.  Candace first she is on Candace posterior aspect of her Lt leg and Candace second is on Candace plantar aspect of her Rt great toe.  Upon inspection Candace wound on Candace [name] Rt great toe has thick purulent drainage.  Candace wound is deep and Candace therapist recommended that Candace [name] go to Candace ER to have her foot x-rayed as she may need IV  antibiotic.  If she is not kept in Candace hospital we will continue with both pulse lavage and mechanical debridement to both wounds.  Dressing change of silverhydrofiber to Rt toe and medihoney to Lt calf.  Candace [name] will benefit from three times a week for two weeks followed by 2 times a week for four weeks.     Wound Therapy - Functional Problem List difficult walking    Factors Delaying/Impairing Wound Healing Altered sensation;Infection - systemic/local;Multiple medical problems;Polypharmacy;Vascular compromise   Hydrotherapy Plan Debridement;Dressing change;Patient/family education;Pulsatile lavage with suction   Wound Therapy - Frequency 3X / week   Wound Therapy - Current Recommendations Other (comment)  referred for x-ray   Wound Plan Candace [name] to recieve pulse lavage, debridement and dressing change to both wounds.     Dressing  Rt great toe: silverhydrofiber packed into wound followed by 2x2 and 2" kling with netting to hold dressing in place.    Dressing medihoney, 4x4 and kerlix followed by compression dressing of profore light.                   Candace [name] Short Term Goals - 10/27/15 1559      Candace [name] SHORT TERM GOAL #1   Title Candace [name] Lt LE to have 50% granulation to decrease risk of infection   Time 3   Period Weeks   Status On-going     Candace [name] SHORT TERM GOAL #2   Title Candace [name] and familty to be able to verbalize Candace signs and symptoms of infection and Candace importance of seeking medical help   Time 1   Period Weeks   Status On-going     Candace [name] SHORT TERM GOAL #3   Title Candace [name] Rt toe wound to no longer have purulent drainage to demonstrate decreased infection    Time 3   Period Weeks   Status On-going     Candace [name] SHORT TERM GOAL #4   Title Candace [name] pain in both legs to be no greater than a 5/10 to allow Candace [name] to ambulate in comfort in her home   Time 3   Period Weeks   Status On-going           Candace [name] Long Term Goals - 10/27/15 1559      Candace [name] LONG TERM GOAL #1   Title Lt LE wound to be healed    Time 6    Period Weeks   Status On-going     Candace [name] LONG TERM GOAL #2   Title Rt great toe wound to have filled in depth to no greater than .3 and minimal undermining to allow family to be comfortable with dressing wound  Time 6   Period Weeks   Status On-going               Plan - 10/27/15 1559    Clinical Impression Statement see above    Rehab Potential Good   Candace [name] Frequency 3x / week   Candace [name] Duration 6 weeks   Candace [name] Treatment/Interventions ADLs/Self Care Home Management;Patient/family education;Other (comment)  debridement and dressing change   Candace [name] Next Visit Plan continue with pulse lavage, debridement and dressing change      Patient will benefit from skilled therapeutic intervention in order to improve Candace following deficits and impairments:  Pain, Difficulty walking, Decreased skin integrity  Visit Diagnosis: Pain in right lower leg  Pain in left lower leg  Difficulty in walking, not elsewhere classified  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 Cellulitis 10/25/2015  . Cellulitis of right foot 10/24/2015  . Hypokalemia 10/24/2015  . Bradycardia 10/24/2015  . Cellulitis of foot, right 10/24/2015  . Wound infection   . Aortic atherosclerosis ([place]) 09/01/2015  . Sick sinus syndrome ([place]) 08/11/2015  . Non-sustained ventricular tachycardia ([place]) 08/11/2015  . Anticoagulated on Coumadin 08/11/2015  . CKD (chronic kidney disease) stage 3, GFR 30-59 ml/min 08/09/2015  . Elevated troponin 08/08/2015  . Renal insufficiency 08/08/2015  . Severe aortic stenosis 08/05/2015  . Hyperlipidemia 06/10/2015  . Tobacco use disorder 01/28/2014  . Osteoporosis with pathological fracture 08/13/2012  . DVT (deep venous thrombosis), right 05/13/2012  . HTN (hypertension) 05/13/2012  . Long  term current use of anticoagulant therapy 05/13/2012  . Atrial fibrillation [facility]) 04/15/2012    Rayetta [name], Candace [name] CLT [phone] 10/27/2015, 4:00 PM  [place] [address] [place], [place], [address] Phone: [phone]   Fax:  [phone]  Name: KAMONI ARMANI MRN: [id number] Date of Birth: 1922-01-16

## 2015-10-27 NOTE — Progress Notes (Signed)
   HPI  Patient presents today with family concerned for worsening infection.   Pt has had a busy week, she has been to the ED and left AMA, she was instructed to return and was admitted for IV vanc. She was transitioned to PO doxy and dc'd yesterday. She has followed up with wound care today.   Her daughter is very concerned because the patient states that "she wants to die at home" and is refusing to go to the ED even the family feels hse is getting worse.   The patient is A&O X 4 and her daughter feels she has normal judgement for her and states that she "is sharp".   Her daughter reports concern over low HR and says that she wants to nap, which is not like her.   She is eating less but still tolerating PO's   She is taking the doxy. They feel wound care is doing an excellent job.   PMH: Smoking status noted ROS: Per HPI  Objective: BP (!) 158/72 (BP Location: Left Arm)   Pulse (!) 51   Temp 97 F (36.1 C) (Oral)   Ht 4\' 3"  (1.295 m)   Wt 87 lb (39.5 kg)   BMI 23.52 kg/m  Gen: NAD, alert, cooperative with exam HEENT: NCAT, moist mucosa CV: brady, , [id number] systolic murmur Resp: CTABL, no wheezes, non-labored Ext: No edema, warm Neuro: Alert and oriented, No gross deficits  Skin:  L leg wound not undressed, coban removed about 1/3 and replaced when I discovered they were seen at wound care today with good report  R grewat toe was uncovered prior to this.  From markings the area of redness is regressing nicely.  She has some tenderness of the great toe, approx 3 cm piece of packing was removed prior to realizing it was placed today. No fdrainage to palp, small amount of yellow drainage on 2X 2 covering  Heme crusting and shallow abrasoion on BL knees- no bleeding, warmth, tenderness, or fluctiance. No drainage  L shin with clean dry intact dressing  Assessment and plan:  # R great toe infection, cellulitis Improving, recommended retruning to wound care center to have  packing replaced that was mistakenly removed tonight Continue doxy blood cultures neg  # Chronic anticoagulation INR theraputic No bleeding Hx of DVT, A fib Continue previous dose of coumadin, will ask clinical pharmacist to review as well.   # A fib Slightly brady, stable. Consistent with last few readings No rate lowering drugs No intervention   Discussion with her daughter, Pt appears to be competent at this time and her daughter is prepared to respect her wishes. She is DNR, which was confirmed with her family during the admission.  The patient agrees to go to the ED, to receive IV antibiotics, if she worsens clinically, develops fevers, or cannot tolerate orals. It is a difficult time for the patient and her family dealing with her current illness.    Laroy Apple, MD [facility] 10/27/2015, 5:40 PM

## 2015-10-27 NOTE — Patient Instructions (Signed)
Great to see you!   

## 2015-10-28 ENCOUNTER — Ambulatory Visit (HOSPITAL_COMMUNITY): Payer: Medicare Other | Admitting: Physical Therapy

## 2015-10-28 DIAGNOSIS — S91141D Puncture wound with foreign body of right great toe without damage to nail, subsequent encounter: Secondary | ICD-10-CM | POA: Diagnosis not present

## 2015-10-28 DIAGNOSIS — M79661 Pain in right lower leg: Secondary | ICD-10-CM

## 2015-10-28 DIAGNOSIS — M79662 Pain in left lower leg: Secondary | ICD-10-CM | POA: Diagnosis not present

## 2015-10-28 DIAGNOSIS — R262 Difficulty in walking, not elsewhere classified: Secondary | ICD-10-CM | POA: Diagnosis not present

## 2015-10-28 DIAGNOSIS — L97223 Non-pressure chronic ulcer of left calf with necrosis of muscle: Secondary | ICD-10-CM | POA: Diagnosis not present

## 2015-10-30 LAB — CULTURE, BLOOD (ROUTINE X 2)
CULTURE: NO GROWTH
Culture: NO GROWTH

## 2015-10-30 NOTE — Therapy (Signed)
[place], [place], [address] Phone: [phone]   Fax:  [phone]  Wound Care Therapy  Patient Details  Name: Candace [name] MRN: [id number] Date of Birth: 1922/04/02 Referring Provider: Particia Nearing  Encounter Date: 10/28/2015    Past Medical History:  Diagnosis Date  . Aortic stenosis   . Atrial fibrillation ([place])   . DVT (deep venous thrombosis) ([place])   . Essential hypertension   . Glaucoma   . History of skin cancer 2016  . Hypercholesterolemia   . PE (pulmonary embolism)     Past Surgical History:  Procedure Laterality Date  . CATARACT EXTRACTION    . CHOLECYSTECTOMY    . KYPHOPLASTY    . SKIN CANCER EXCISION Left 2016   neck    There were no vitals filed for this visit.                  Wound Therapy - 10/30/15 1815    Subjective Pt comes to the department without an appointment.  Daughter states that they went to the MD for her heart who took the packing out of her RT to and told them to come back to therapy for dressing   Patient and Family Stated Goals Wounds to heal    Date of Onset 09/23/15   Prior Treatments peroxide to Lt wound and antibiotic.    Pain Score 7    Pain Type Acute pain   Pain Location Toe (Comment which one)   Pain Intervention(s) Emotional support   Evaluation and Treatment Procedures Explained to Patient/Family Yes   Evaluation and Treatment Procedures agreed to   Wound Properties Date First Assessed: 10/24/15 Time First Assessed: [id number] Wound Type: Puncture Location: Toe (Comment  which one) Location Orientation: Right Wound Description (Comments): plantar aspect of great toe    Dressing Type Gauze (Comment)   Dressing Status None   Dressing Change Frequency PRN   Site / Wound Assessment Yellow   % Wound base Red or Granulating 0%   % Wound base Yellow 100%   Peri-wound Assessment Edema;Erythema (blanchable)  calloused   Drainage Amount Minimal   Drainage Description Serous;Purulent;Green   Wound Properties Date First Assessed: 10/24/15 Time First Assessed: [id number] Wound Type: Other (Comment) Location: Leg Location Orientation: Left;Posterior;Lower Wound Description (Comments): dry Present on Admission: Yes   Drainage Amount Scant  anticipate now with debridement wound will drain    Pulsed lavage therapy - wound location Rt great toe   Pulsed Lavage with Suction (psi) 4 psi   Pulsed Lavage with Suction - Normal Saline Used 1000 mL   Pulsed Lavage Tip Tip with splash shield   Selective Debridement - Location --  entire wound   Selective Debridement - Tools Used Forceps;Scalpel;Scissors   Selective Debridement - Tissue Removed dead skin and slough    Wound Therapy - Clinical Statement Pt went to MD for her heart.  MD took the dressing off of her Rt toe and requested that she be seen by therapy.  Lt [place] Comment was not treated as we just treated yesterday.  Pt swelling and redness continues to decrease.  Minimal drainage now from toe but there is significant depth and undermining throughout the wound.    Wound Therapy - Functional Problem List difficult walking    Factors Delaying/Impairing Wound Healing Altered sensation;Infection - systemic/local;Multiple medical problems;Polypharmacy;Vascular compromise   Hydrotherapy Plan Debridement;Dressing change;Patient/family education;Pulsatile lavage with suction   Wound Therapy - Frequency 3X /  week   Wound Therapy - Current Recommendations Other (comment)  referred for x-ray   Wound Plan Pt to recieve pulse lavage, debridement and dressing change to both wounds.     Dressing  Rt great toe: silverhydrofiber packed into wound followed by 2x2 and 2" kling with netting to hold dressing in place.    Dressing Lt LE not treated today pt states dressing is comfortable.                    PT Short Term Goals - 10/27/15 1559      PT SHORT TERM GOAL #1   Title Pt Lt LE to have 50% granulation to  decrease risk of infection   Time 3   Period Weeks   Status On-going     PT SHORT TERM GOAL #2   Title Pt and familty to be able to verbalize the signs and symptoms of infection and the importance of seeking medical help   Time 1   Period Weeks   Status On-going     PT SHORT TERM GOAL #3   Title Pt Rt toe wound to no longer have purulent drainage to demonstrate decreased infection    Time 3   Period Weeks   Status On-going     PT SHORT TERM GOAL #4   Title Pt pain in both legs to be no greater than a 5/10 to allow pt to ambulate in comfort in her home   Time 3   Period Weeks   Status On-going           PT Long Term Goals - 10/27/15 1559      PT LONG TERM GOAL #1   Title Lt LE wound to be healed    Time 6   Period Weeks   Status On-going     PT LONG TERM GOAL #2   Title Rt great toe wound to have filled in depth to no greater than .3 and minimal undermining to allow family to be comfortable with dressing wound    Time 6   Period Weeks   Status On-going             Patient will benefit from skilled therapeutic intervention in order to improve the following deficits and impairments:  Pain, Difficulty walking, Decreased skin integrity  Visit Diagnosis: Pain in right lower leg  Difficulty in walking, not elsewhere classified  Puncture wound of right great toe with foreign body without damage to nail, subsequent encounter     Problem List Patient Active Problem List   Diagnosis Date Noted  . Cellulitis 10/25/2015  . Cellulitis of right foot 10/24/2015  . Hypokalemia 10/24/2015  . Bradycardia 10/24/2015  . Cellulitis of foot, right 10/24/2015  . Wound infection   . Aortic atherosclerosis ([place]) 09/01/2015  . Sick sinus syndrome ([place]) 08/11/2015  . Non-sustained ventricular tachycardia ([place]) 08/11/2015  . Anticoagulated on Coumadin 08/11/2015  . CKD (chronic kidney disease) stage 3, GFR 30-59 ml/min 08/09/2015  . Elevated troponin 08/08/2015  .  Renal insufficiency 08/08/2015  . Severe aortic stenosis 08/05/2015  . Hyperlipidemia 06/10/2015  . Tobacco use disorder 01/28/2014  . Osteoporosis with pathological fracture 08/13/2012  . DVT (deep venous thrombosis), right 05/13/2012  . HTN (hypertension) 05/13/2012  . Long term current use of anticoagulant therapy 05/13/2012  . Atrial fibrillation [facility]) 04/15/2012  Rayetta [name], PT CLT [phone] 10/30/2015, 6:17 PM  [place], [place],  [id number] Phone: [phone]   Fax:  [phone]  Name: Candace [name] MRN: [id number] Date of Birth: 05/06/1922

## 2015-10-31 ENCOUNTER — Ambulatory Visit (HOSPITAL_COMMUNITY): Payer: Medicare Other | Admitting: Physical Therapy

## 2015-10-31 DIAGNOSIS — M79662 Pain in left lower leg: Secondary | ICD-10-CM

## 2015-10-31 DIAGNOSIS — M79661 Pain in right lower leg: Secondary | ICD-10-CM

## 2015-10-31 DIAGNOSIS — R262 Difficulty in walking, not elsewhere classified: Secondary | ICD-10-CM | POA: Diagnosis not present

## 2015-10-31 DIAGNOSIS — S91141D Puncture wound with foreign body of right great toe without damage to nail, subsequent encounter: Secondary | ICD-10-CM | POA: Diagnosis not present

## 2015-10-31 DIAGNOSIS — L97223 Non-pressure chronic ulcer of left calf with necrosis of muscle: Secondary | ICD-10-CM | POA: Diagnosis not present

## 2015-10-31 NOTE — Therapy (Signed)
[place], [place], [address] Phone: [phone]   Fax:  [phone]  Wound Care Therapy  Patient Details  Name: Candace [name] MRN: [id number] Date of Birth: December 14, 1922 Referring Provider: Particia Nearing  Encounter Date: 10/31/2015      PT End of Session - 10/31/15 1550    Visit Number 4   Number of Visits 14   Date for PT Re-Evaluation 11/23/15   Authorization Type [employer] medicare   Authorization - Visit Number 4   Authorization - Number of Visits 10   PT Start Time [phone]   PT Stop Time 1030   PT Time Calculation (min) 40 min   Activity Tolerance Patient tolerated treatment well   Behavior During Therapy [facility] for tasks assessed/performed      Past Medical History:  Diagnosis Date  . Aortic stenosis   . Atrial fibrillation ([place])   . DVT (deep venous thrombosis) ([place])   . Essential hypertension   . Glaucoma   . History of skin cancer 2016  . Hypercholesterolemia   . PE (pulmonary embolism)     Past Surgical History:  Procedure Laterality Date  . CATARACT EXTRACTION    . CHOLECYSTECTOMY    . KYPHOPLASTY    . SKIN CANCER EXCISION Left 2016   neck    There were no vitals filed for this visit.                  Wound Therapy - 10/31/15 1551    Subjective Pt comes today in a wheelchair with son and daugther in law present.  States her Lt leg hurts but not her Rt toe.l   Patient and Family Stated Goals Wounds to heal    Date of Onset 09/23/15   Prior Treatments peroxide to Lt wound and antibiotic.    Pain Assessment 0-10   Pain Score 7    Pain Type Acute pain   Pain Location Leg   Evaluation and Treatment Procedures Explained to Patient/Family Yes   Evaluation and Treatment Procedures agreed to   Wound Properties Date First Assessed: 10/24/15 Time First Assessed: [id number] Wound Type: Puncture Location: Toe (Comment  which one) Location Orientation: Right Wound Description (Comments):  plantar aspect of great toe    Dressing Type Gauze (Comment)   Dressing Changed Changed   Dressing Status Old drainage   Dressing Change Frequency PRN   Site / Wound Assessment Yellow   % Wound base Red or Granulating 15%   % Wound base Yellow 85%   Peri-wound Assessment Edema;Erythema (blanchable)  calloused   Drainage Amount Minimal   Drainage Description Purulent;Sanguineous   Treatment Hydrotherapy (Pulse lavage);Debridement (Selective)   Wound Properties Date First Assessed: 10/24/15 Time First Assessed: 1615 Wound Type: Other (Comment) Location: Leg Location Orientation: Left;Posterior;Lower Wound Description (Comments): dry Present on Admission: Yes   Dressing Type Gauze (Comment)   Dressing Changed Changed   Dressing Status Old drainage   Site / Wound Assessment Pink;Yellow;Painful;Granulation tissue;Dusky   % Wound base Red or Granulating 30%   % Wound base Yellow 70%   Peri-wound Assessment Erythema (blanchable)   Margins Unattached edges (unapproximated)   Closure None   Drainage Amount Minimal  anticipate now with debridement wound will drain    Treatment Hydrotherapy (Pulse lavage);Debridement (Selective)   Pulsed lavage therapy - wound location Lt posterior LE and Rt great toe   Pulsed Lavage with Suction (psi) 4 psi   Pulsed Lavage with Suction -  Normal Saline Used 1000 mL   Pulsed Lavage Tip Tip with splash shield   Selective Debridement - Location Lt posterior LE  entire wound   Selective Debridement - Tools Used Forceps;Scalpel;Scissors   Selective Debridement - Tissue Removed slough    Wound Therapy - Clinical Statement Pt comes today with bottoms of dressings soiled and feet beneath them soiled.  When questioned stated she always wears her shoes now.  Cleansed LE's well upon dressing removal with soapy washcloth.  Moisturized LE as well before redressing.  when removed dressing from Rt great toe, pus was rolling out.  Used pulsed lavage and evacuated all pus and  just with some sanguinous drainage coming out.  Repacked with medihoney infused gauze and bandaged completely.  Lt LE wound hypersensitive to debridement today.  able to remove some slough from wound and improve granulation tissue.  Used medihoney gel on wound f/b kerlix, coban and #6 netting.  Pt reported overall comfort with bandaging.  Also donned blue non-slip booties to keep feet clean.     Wound Therapy - Functional Problem List difficult walking    Factors Delaying/Impairing Wound Healing Altered sensation;Infection - systemic/local;Multiple medical problems;Polypharmacy;Vascular compromise   Hydrotherapy Plan Debridement;Dressing change;Patient/family education;Pulsatile lavage with suction   Wound Therapy - Frequency 3X / week   Wound Therapy - Current Recommendations Other (comment)  referred for x-ray   Wound Plan Pt to recieve pulse lavage, debridement and dressing change to both wounds.     Dressing  Rt great toe: medihoney gel packed into wound followed by 2x2 and 2" kling with netting to hold dressing in place.    Dressing Lt LE medihoney gel, kerlix, coban and #6 netting                   PT Short Term Goals - 10/27/15 1559      PT SHORT TERM GOAL #1   Title Pt Lt LE to have 50% granulation to decrease risk of infection   Time 3   Period Weeks   Status On-going     PT SHORT TERM GOAL #2   Title Pt and familty to be able to verbalize the signs and symptoms of infection and the importance of seeking medical help   Time 1   Period Weeks   Status On-going     PT SHORT TERM GOAL #3   Title Pt Rt toe wound to no longer have purulent drainage to demonstrate decreased infection    Time 3   Period Weeks   Status On-going     PT SHORT TERM GOAL #4   Title Pt pain in both legs to be no greater than a 5/10 to allow pt to ambulate in comfort in her home   Time 3   Period Weeks   Status On-going           PT Long Term Goals - 10/27/15 1559      PT LONG TERM  GOAL #1   Title Lt LE wound to be healed    Time 6   Period Weeks   Status On-going     PT LONG TERM GOAL #2   Title Rt great toe wound to have filled in depth to no greater than .3 and minimal undermining to allow family to be comfortable with dressing wound    Time 6   Period Weeks   Status On-going             Patient will benefit from  skilled therapeutic intervention in order to improve the following deficits and impairments:     Visit Diagnosis: Pain in right lower leg  Difficulty in walking, not elsewhere classified  Puncture wound of right great toe with foreign body without damage to nail, subsequent encounter  Pain in left lower leg  Non-pressure chronic ulcer of left calf with necrosis of muscle ([place])     Problem List Patient Active Problem List   Diagnosis Date Noted  . Cellulitis 10/25/2015  . Cellulitis of right foot 10/24/2015  . Hypokalemia 10/24/2015  . Bradycardia 10/24/2015  . Cellulitis of foot, right 10/24/2015  . Wound infection   . Aortic atherosclerosis ([place]) 09/01/2015  . Sick sinus syndrome ([place]) 08/11/2015  . Non-sustained ventricular tachycardia ([place]) 08/11/2015  . Anticoagulated on Coumadin 08/11/2015  . CKD (chronic kidney disease) stage 3, GFR 30-59 ml/min 08/09/2015  . Elevated troponin 08/08/2015  . Renal insufficiency 08/08/2015  . Severe aortic stenosis 08/05/2015  . Hyperlipidemia 06/10/2015  . Tobacco use disorder 01/28/2014  . Osteoporosis with pathological fracture 08/13/2012  . DVT (deep venous thrombosis), right 05/13/2012  . HTN (hypertension) 05/13/2012  . Long term current use of anticoagulant therapy 05/13/2012  . Atrial fibrillation ([place]) 04/15/2012   Teena Irani, PTA/CLT [phone]  10/31/2015, 4:04 PM  [place] [address] [place], [place], [address] Phone: [phone]   Fax:  [phone]  Name: Candace [name] MRN: [id number] Date of Birth:  May 02, 1922

## 2015-11-01 ENCOUNTER — Other Ambulatory Visit: Payer: Self-pay | Admitting: *Deleted

## 2015-11-01 ENCOUNTER — Encounter: Payer: Self-pay | Admitting: *Deleted

## 2015-11-01 ENCOUNTER — Ambulatory Visit (HOSPITAL_COMMUNITY): Payer: Medicare Other

## 2015-11-01 DIAGNOSIS — I482 Chronic atrial fibrillation, unspecified: Secondary | ICD-10-CM

## 2015-11-01 NOTE — Progress Notes (Signed)
error 

## 2015-11-01 NOTE — Progress Notes (Unsigned)
Family requested DNR and brought to office to have scanned into patients chart

## 2015-11-02 ENCOUNTER — Ambulatory Visit (HOSPITAL_COMMUNITY): Payer: Medicare Other

## 2015-11-02 DIAGNOSIS — M79662 Pain in left lower leg: Secondary | ICD-10-CM

## 2015-11-02 DIAGNOSIS — S91141D Puncture wound with foreign body of right great toe without damage to nail, subsequent encounter: Secondary | ICD-10-CM | POA: Diagnosis not present

## 2015-11-02 DIAGNOSIS — L97223 Non-pressure chronic ulcer of left calf with necrosis of muscle: Secondary | ICD-10-CM

## 2015-11-02 DIAGNOSIS — R262 Difficulty in walking, not elsewhere classified: Secondary | ICD-10-CM

## 2015-11-02 DIAGNOSIS — M79661 Pain in right lower leg: Secondary | ICD-10-CM

## 2015-11-02 NOTE — Therapy (Addendum)
[place], [place], [address] Phone: [phone]   Fax:  [phone]  Wound Care Therapy  Patient Details  Name: Candace [name] MRN: [id number] Date of Birth: 04-27-1922 Referring Provider: Particia Nearing  Encounter Date: 11/02/2015      PT End of Session - 11/02/15 1222    Visit Number 5   Number of Visits 14   Date for PT Re-Evaluation 11/23/15   Authorization Type [place] - Visit Number 5   Authorization - Number of Visits 10   PT Start Time [phone]   PT Stop Time 0900   PT Time Calculation (min) 43 min   Activity Tolerance Patient tolerated treatment well   Behavior During Therapy [facility] for tasks assessed/performed      Past Medical History:  Diagnosis Date  . Aortic stenosis   . Atrial fibrillation ([place])   . DVT (deep venous thrombosis) ([place])   . Essential hypertension   . Glaucoma   . History of skin cancer 2016  . Hypercholesterolemia   . PE (pulmonary embolism)     Past Surgical History:  Procedure Laterality Date  . CATARACT EXTRACTION    . CHOLECYSTECTOMY    . KYPHOPLASTY    . SKIN CANCER EXCISION Left 2016   neck    There were no vitals filed for this visit.       Subjective Assessment - 11/02/15 1029    Subjective Pt arrived with dressing on though has the coban removed at top, stated her grandchild was playing with.  Current pain scale 8-9/10 Lt LE                   Wound Therapy - 11/02/15 1030    Subjective Pt arrived with dressing on though has the coban removed at top, stated her grandchild was playing with.  Current pain scale 8-9/10 Lt LE   Patient and Family Stated Goals Wounds to heal    Date of Onset 09/23/15   Prior Treatments peroxide to Lt wound and antibiotic.    Pain Assessment 0-10   Pain Score 8    Pain Type Chronic pain   Pain Location Leg   Pain Descriptors / Indicators Aching;Sore   Pain Onset On-going   Patients Stated  Pain Goal 2   Pain Intervention(s) Emotional support   Multiple Pain Sites No   Pain Score 8   Pain Type Chronic pain   Pain Location Toe (Comment which one)  Rt great toe   Patient's Stated Pain Goal 2   Pain Intervention(s) Emotional support   Evaluation and Treatment Procedures Explained to Patient/Family Yes   Evaluation and Treatment Procedures agreed to   Wound Properties Date First Assessed: 10/24/15 Time First Assessed: 1530 Wound Type: Puncture Location: Toe (Comment  which one) Location Orientation: Right Wound Description (Comments): plantar aspect of great toe    Dressing Type Gauze (Comment)   Dressing Changed Changed  medihoney stuffed gauze   Dressing Status Old drainage   Dressing Change Frequency PRN   Site / Wound Assessment Yellow   % Wound base Red or Granulating 15%   % Wound base Yellow 85%   Peri-wound Assessment Edema;Erythema (blanchable)   Drainage Amount Minimal   Drainage Description Purulent;Sanguineous   Treatment Cleansed;Debridement (Selective);Hydrotherapy (Pulse lavage)   Wound Properties Date First Assessed: 10/24/15 Time First Assessed: 1615 Wound Type: Other (Comment) Location: Leg Location Orientation: Left;Posterior;Lower Wound Description (Comments): dry Present on  Admission: Yes   Dressing Type Gauze (Comment)  medihoney, kerlex and coban with netting   Dressing Changed Changed   Dressing Status Old drainage   Site / Wound Assessment Pink;Yellow;Painful;Granulation tissue;Dusky   % Wound base Red or Granulating 35%   % Wound base Yellow 65%   Peri-wound Assessment Erythema (blanchable)   Margins Unattached edges (unapproximated)   Closure None   Drainage Amount Minimal   Treatment Hydrotherapy (Pulse lavage);Cleansed;Debridement (Selective)   Pulsed lavage therapy - wound location Lt posterior LE and Rt great toe   Pulsed Lavage with Suction (psi) 4 psi   Pulsed Lavage with Suction - Normal Saline Used 1000 mL   Pulsed Lavage Tip Tip  with splash shield   Selective Debridement - Location Lt posterior LE   Selective Debridement - Tools Used Forceps;Scalpel;Scissors   Selective Debridement - Tissue Removed slough    Wound Therapy - Clinical Statement Pt arrived with dressing on though has peeled off proximal part of coban of Lt LE.  Upon removal of dressing cleansed Bil LE with warm soapy washcloth with reports of relief following cleansing.  No noted pus out of great toe, does have minimal light red drainage from great toe.  Selective debridement for removal of slough from Lt lateral wound, pt was limited by pain and so unable to remove all slough per pain scale.  Continued with medihoney to both wounds with kerlix on both and coban on Lt LE.  Continued with netting blue non-slid boots to keep dressings cleaning.     Wound Therapy - Functional Problem List difficult walking    Factors Delaying/Impairing Wound Healing Altered sensation;Infection - systemic/local;Multiple medical problems;Polypharmacy;Vascular compromise   Hydrotherapy Plan Debridement;Dressing change;Patient/family education;Pulsatile lavage with suction   Wound Therapy - Frequency 3X / week   Wound Therapy - Current Recommendations Other (comment)  referred for x-ray   Wound Plan Pt to recieve pulse lavage, debridement and dressing change to both wounds.     Dressing  Rt great toe: medihoney gel packed into wound followed by 2x2 and 2" kling with netting to hold dressing in place.    Dressing Lt LE medihoney gel, kerlix, coban and #6 netting                   PT Short Term Goals - 10/27/15 1559      PT SHORT TERM GOAL #1   Title Pt Lt LE to have 50% granulation to decrease risk of infection   Time 3   Period Weeks   Status On-going     PT SHORT TERM GOAL #2   Title Pt and familty to be able to verbalize the signs and symptoms of infection and the importance of seeking medical help   Time 1   Period Weeks   Status On-going     PT SHORT TERM  GOAL #3   Title Pt Rt toe wound to no longer have purulent drainage to demonstrate decreased infection    Time 3   Period Weeks   Status On-going     PT SHORT TERM GOAL #4   Title Pt pain in both legs to be no greater than a 5/10 to allow pt to ambulate in comfort in her home   Time 3   Period Weeks   Status On-going           PT Long Term Goals - 10/27/15 1559      PT LONG TERM GOAL #1   Title Lt LE  wound to be healed    Time 6   Period Weeks   Status On-going     PT LONG TERM GOAL #2   Title Rt great toe wound to have filled in depth to no greater than .3 and minimal undermining to allow family to be comfortable with dressing wound    Time 6   Period Weeks   Status On-going             Patient will benefit from skilled therapeutic intervention in order to improve the following deficits and impairments:     Visit Diagnosis: Pain in right lower leg  Difficulty in walking, not elsewhere classified  Puncture wound of right great toe with foreign body without damage to nail, subsequent encounter  Pain in left lower leg  Non-pressure chronic ulcer of left calf with necrosis of muscle ([place])     Problem List Patient Active Problem List   Diagnosis Date Noted  . Cellulitis 10/25/2015  . Cellulitis of right foot 10/24/2015  . Hypokalemia 10/24/2015  . Bradycardia 10/24/2015  . Cellulitis of foot, right 10/24/2015  . Wound infection   . Aortic atherosclerosis ([place]) 09/01/2015  . Sick sinus syndrome ([place]) 08/11/2015  . Non-sustained ventricular tachycardia ([place]) 08/11/2015  . Anticoagulated on Coumadin 08/11/2015  . CKD (chronic kidney disease) stage 3, GFR 30-59 ml/min 08/09/2015  . Elevated troponin 08/08/2015  . Renal insufficiency 08/08/2015  . Severe aortic stenosis 08/05/2015  . Hyperlipidemia 06/10/2015  . Tobacco use disorder 01/28/2014  . Osteoporosis with pathological fracture 08/13/2012  . DVT (deep venous thrombosis), right 05/13/2012  .  HTN (hypertension) 05/13/2012  . Long term current use of anticoagulant therapy 05/13/2012  . Atrial fibrillation [facility]) 04/15/2012   Ihor Austin, [place]; [place]  Aldona Lento 11/02/2015, 12:45 PM  [place] [address] [place], [place], [address] Phone: [phone]   Fax:  [phone]  Name: Candace [name] MRN: [id number] Date of Birth: 1922/10/20

## 2015-11-03 ENCOUNTER — Ambulatory Visit (INDEPENDENT_AMBULATORY_CARE_PROVIDER_SITE_OTHER): Payer: Medicare Other | Admitting: Pediatrics

## 2015-11-03 ENCOUNTER — Ambulatory Visit (INDEPENDENT_AMBULATORY_CARE_PROVIDER_SITE_OTHER): Payer: Medicare Other

## 2015-11-03 VITALS — BP 164/79 | HR 54 | Temp 96.8°F | Ht <= 58 in | Wt 95.4 lb

## 2015-11-03 DIAGNOSIS — R0782 Intercostal pain: Secondary | ICD-10-CM | POA: Diagnosis not present

## 2015-11-03 DIAGNOSIS — W19XXXA Unspecified fall, initial encounter: Secondary | ICD-10-CM

## 2015-11-03 NOTE — Progress Notes (Addendum)
  Subjective:   Patient ID: Candace [name], female    DOB: [birth year], 82 y.o.   MRN: [id number] CC: Fall  HPI: Candace [name] is a 82 y.o. female presenting for [place] this morning in the bathroom after standing to take off her pants when she realized they were wet from an accident not making it to the bathroom in time Says she called for her aide but she didn't come right away so she stood to take them off on her own then fell, hitting L side Has pain now with deep breaths halfway down L side of ribs Did not hit her head, no LOC  Relevant past medical, surgical, family and social history reviewed. Allergies and medications reviewed and updated. History  Smoking Status  . Never Smoker  Smokeless Tobacco  . Current User  . Types: Snuff   ROS: Per HPI   Objective:    BP (!) 164/79   Pulse (!) 54   Temp (!) 96.8 F (36 C) (Oral)   Ht 4\' 3"  (1.295 m)   Wt 95 lb 6.4 oz (43.3 kg)   BMI 25.79 kg/m   Wt Readings from Last 3 Encounters:  11/03/15 95 lb 6.4 oz (43.3 kg)  10/27/15 87 lb (39.5 kg)  10/25/15 87 lb 1.6 oz (39.5 kg)    Gen: NAD, alert, cooperative with exam, NCAT EYES: EOMI, no conjunctival injection, or no icterus CV: bradycardic, regular rhythm, normal S1/S, ejection murmur loudest LUSB Resp: CTABL, no wheezes, normal WOB Neuro: Alert and oriented, strength equal b/l UE and LE, coordination grossly normal MSK: kyphotic thoracic spine, TTP over ribs 7-9 a couple inches lateral from spine, bony prominence there where tenderness is greatest Skin: no bruising, no skin tears on back  CXR Preliminary read by [name], MD:  No effusion L side, possible fracture 8th rib, difficult to see due to severely osteoporotic bones  Assessment & Plan:  Candace [name] was seen today for fall, mechanical. Happened when standing taking off pants on her own, a task she is usually sitting to do and usually has someone present to help. She says she has someone with her 24h a day  who can help her get to the bathroom, she just has to tell them she is going. Here today with family member, encouraged her again to let aide know when she is getting up and moving. H/o osteoporosis On prolia Cont to take deep breaths regularly Tylenol prn  Diagnoses and all orders for this visit:  Fall, initial encounter -     DG Ribs Unilateral W/Chest Left; Future   Follow up plan: As scheduled [name], MD [place]

## 2015-11-04 ENCOUNTER — Ambulatory Visit (HOSPITAL_COMMUNITY): Payer: Medicare Other

## 2015-11-04 DIAGNOSIS — S91141D Puncture wound with foreign body of right great toe without damage to nail, subsequent encounter: Secondary | ICD-10-CM

## 2015-11-04 DIAGNOSIS — M79661 Pain in right lower leg: Secondary | ICD-10-CM | POA: Diagnosis not present

## 2015-11-04 DIAGNOSIS — R262 Difficulty in walking, not elsewhere classified: Secondary | ICD-10-CM

## 2015-11-04 DIAGNOSIS — M79662 Pain in left lower leg: Secondary | ICD-10-CM | POA: Diagnosis not present

## 2015-11-04 DIAGNOSIS — L97223 Non-pressure chronic ulcer of left calf with necrosis of muscle: Secondary | ICD-10-CM | POA: Diagnosis not present

## 2015-11-04 NOTE — Therapy (Signed)
[place], [place], [address] Phone: [phone]   Fax:  [phone]  Wound Care Therapy  Patient Details  Name: MANICA CASSIANO MRN: [id number] Date of Birth: [birth year] Referring Provider: Particia Nearing  Encounter Date: 11/04/2015      PT End of Session - 11/04/15 1811    Visit Number 6   Number of Visits 14   Date for PT Re-Evaluation 11/23/15   Authorization Type [employer] medicare   Authorization - Visit Number 6   Authorization - Number of Visits 10   PT Start Time [id number]   PT Stop Time 1732   PT Time Calculation (min) 42 min   Activity Tolerance Patient tolerated treatment well;Patient limited by pain   Behavior During Therapy [facility] for tasks assessed/performed      Past Medical History:  Diagnosis Date  . Aortic stenosis   . Atrial fibrillation ([place])   . DVT (deep venous thrombosis) ([place])   . Essential hypertension   . Glaucoma   . History of skin cancer 2016  . Hypercholesterolemia   . PE (pulmonary embolism)     Past Surgical History:  Procedure Laterality Date  . CATARACT EXTRACTION    . CHOLECYSTECTOMY    . KYPHOPLASTY    . SKIN CANCER EXCISION Left 2016   neck    There were no vitals filed for this visit.       Subjective Assessment - 11/04/15 1751    Subjective Pt reports a fall yesterday, went to MD but refused to go to hospital.  Reports her Lt rips are sore today.  LE are pain free but increased pain with palpation/debridement increase to 8-9/10 Lt LE   Currently in Pain? Yes   Pain Score 8   0/10 current, increased pain with debridement   Pain Location Leg   Pain Orientation Left   Pain Descriptors / Indicators Aching;Burning                   Wound Therapy - 11/04/15 1754    Subjective Pt reports a fall yesterday, went to MD but refused to go to hospital.  Reports her Lt rips are sore today.  LE are pain free but increased pain with palpation/debridement increase  to 8-9/10 Lt LE   Patient and Family Stated Goals Wounds to heal    Date of Onset 09/23/15   Prior Treatments peroxide to Lt wound and antibiotic.    Pain Assessment 0-10   Pain Score 9    Pain Type Chronic pain   Pain Location Leg   Pain Orientation Left   Pain Descriptors / Indicators Aching;Burning   Pain Onset On-going   Patients Stated Pain Goal 2   Pain Intervention(s) Emotional support   Multiple Pain Sites No   Pain Score 0   Evaluation and Treatment Procedures Explained to Patient/Family Yes   Evaluation and Treatment Procedures agreed to   Wound Properties Date First Assessed: 10/24/15 Time First Assessed: 1530 Wound Type: Puncture Location: Toe (Comment  which one) Location Orientation: Right Wound Description (Comments): plantar aspect of great toe    Dressing Type Gauze (Comment)  medihoney packed with gauze and netting   Dressing Changed Changed   Dressing Status Old drainage   Dressing Change Frequency PRN   Site / Wound Assessment Yellow   % Wound base Red or Granulating 20%   % Wound base Yellow 80%   Peri-wound Assessment Edema;Erythema (blanchable)   Drainage Amount  Minimal   Drainage Description Purulent;Sanguineous   Treatment Cleansed;Debridement (Selective);Hydrotherapy (Pulse lavage)   Wound Properties Date First Assessed: 10/24/15 Time First Assessed: 1615 Wound Type: Other (Comment) Location: Leg Location Orientation: Left;Posterior;Lower Wound Description (Comments): dry Present on Admission: Yes   Dressing Type Gauze (Comment)  medihoney, kerlix and coban with netting   Dressing Changed Changed   Dressing Status Old drainage   Site / Wound Assessment Pink;Yellow;Painful;Granulation tissue;Dusky   % Wound base Red or Granulating 35%   % Wound base Yellow 65%   % Wound base Black 0%   Peri-wound Assessment Erythema (blanchable)   Margins Unattached edges (unapproximated)   Closure None   Drainage Amount Minimal   Treatment Hydrotherapy (Pulse  lavage);Cleansed;Debridement (Selective)   Pulsed lavage therapy - wound location Lt posterior LE and Rt great toe   Pulsed Lavage with Suction (psi) 4 psi   Pulsed Lavage with Suction - Normal Saline Used 1000 mL   Pulsed Lavage Tip Tip with splash shield   Selective Debridement - Location Lt posterior LE   Selective Debridement - Tools Used Forceps;Scalpel;Scissors   Selective Debridement - Tissue Removed slough    Wound Therapy - Clinical Statement Pt arrived with dressing intact though has cut top of dressings Lt LE.  Pt limited debridement with Lt LE due to pain.  Continued with PLS followed by selective debridement.  Medihoney is assisting debridement of Lt LE with increased ability to remove adherent slough today.  Pt and her daughter were educated on importance of leaving dressings intact unless noted drainage through dressing or pain.  Applied coban looser today for comfort Lt LE.  EOS pain reduced to 6/10 Lt LE following debriements   Wound Therapy - Functional Problem List difficult walking    Factors Delaying/Impairing Wound Healing Altered sensation;Infection - systemic/local;Multiple medical problems;Polypharmacy;Vascular compromise   Hydrotherapy Plan Debridement;Dressing change;Patient/family education;Pulsatile lavage with suction   Wound Therapy - Frequency 3X / week   Wound Plan Pt to recieve pulse lavage, debridement and dressing change to both wounds.     Dressing  Rt great toe: medihoney gel packed into wound followed by 2x2 and 2" kling with netting to hold dressing in place.    Dressing Lt LE medihoney gel, kerlix, coban and #6 netting                   PT Short Term Goals - 10/27/15 1559      PT SHORT TERM GOAL #1   Title Pt Lt LE to have 50% granulation to decrease risk of infection   Time 3   Period Weeks   Status On-going     PT SHORT TERM GOAL #2   Title Pt and familty to be able to verbalize the signs and symptoms of infection and the importance of  seeking medical help   Time 1   Period Weeks   Status On-going     PT SHORT TERM GOAL #3   Title Pt Rt toe wound to no longer have purulent drainage to demonstrate decreased infection    Time 3   Period Weeks   Status On-going     PT SHORT TERM GOAL #4   Title Pt pain in both legs to be no greater than a 5/10 to allow pt to ambulate in comfort in her home   Time 3   Period Weeks   Status On-going           PT Long Term Goals - 10/27/15 1559  PT LONG TERM GOAL #1   Title Lt LE wound to be healed    Time 6   Period Weeks   Status On-going     PT LONG TERM GOAL #2   Title Rt great toe wound to have filled in depth to no greater than .3 and minimal undermining to allow family to be comfortable with dressing wound    Time 6   Period Weeks   Status On-going             Patient will benefit from skilled therapeutic intervention in order to improve the following deficits and impairments:     Visit Diagnosis: Pain in right lower leg  Difficulty in walking, not elsewhere classified  Puncture wound of right great toe with foreign body without damage to nail, subsequent encounter  Pain in left lower leg  Non-pressure chronic ulcer of left calf with necrosis of muscle ([place])     Problem List Patient Active Problem List   Diagnosis Date Noted  . Cellulitis 10/25/2015  . Cellulitis of right foot 10/24/2015  . Hypokalemia 10/24/2015  . Bradycardia 10/24/2015  . Cellulitis of foot, right 10/24/2015  . Wound infection   . Aortic atherosclerosis ([place]) 09/01/2015  . Sick sinus syndrome ([place]) 08/11/2015  . Non-sustained ventricular tachycardia ([place]) 08/11/2015  . Anticoagulated on Coumadin 08/11/2015  . CKD (chronic kidney disease) stage 3, GFR 30-59 ml/min 08/09/2015  . Elevated troponin 08/08/2015  . Renal insufficiency 08/08/2015  . Severe aortic stenosis 08/05/2015  . Hyperlipidemia 06/10/2015  . Tobacco use disorder 01/28/2014  . Osteoporosis with  pathological fracture 08/13/2012  . DVT (deep venous thrombosis), right 05/13/2012  . HTN (hypertension) 05/13/2012  . Long term current use of anticoagulant therapy 05/13/2012  . Atrial fibrillation [facility]) 04/15/2012   Ihor Austin, [place]; [place]  Aldona Lento 11/04/2015, [place], [place], [address] Phone: [phone]   Fax:  [phone]  Name: JOJO LUEPKE MRN: [id number] Date of Birth: Jun 18, 1922

## 2015-11-07 ENCOUNTER — Ambulatory Visit: Payer: Medicare Other | Admitting: Nurse Practitioner

## 2015-11-08 ENCOUNTER — Encounter: Payer: Self-pay | Admitting: Nurse Practitioner

## 2015-11-08 ENCOUNTER — Ambulatory Visit (INDEPENDENT_AMBULATORY_CARE_PROVIDER_SITE_OTHER): Payer: Medicare Other

## 2015-11-08 ENCOUNTER — Ambulatory Visit (HOSPITAL_COMMUNITY): Payer: Medicare Other | Admitting: Physical Therapy

## 2015-11-08 ENCOUNTER — Ambulatory Visit (INDEPENDENT_AMBULATORY_CARE_PROVIDER_SITE_OTHER): Payer: Medicare Other | Admitting: Nurse Practitioner

## 2015-11-08 VITALS — BP 140/80 | HR 84 | Temp 97.8°F | Ht <= 58 in | Wt 95.0 lb

## 2015-11-08 DIAGNOSIS — S66912A Strain of unspecified muscle, fascia and tendon at wrist and hand level, left hand, initial encounter: Secondary | ICD-10-CM | POA: Diagnosis not present

## 2015-11-08 DIAGNOSIS — L97223 Non-pressure chronic ulcer of left calf with necrosis of muscle: Secondary | ICD-10-CM | POA: Diagnosis not present

## 2015-11-08 DIAGNOSIS — R262 Difficulty in walking, not elsewhere classified: Secondary | ICD-10-CM

## 2015-11-08 DIAGNOSIS — M79642 Pain in left hand: Secondary | ICD-10-CM

## 2015-11-08 DIAGNOSIS — M79662 Pain in left lower leg: Secondary | ICD-10-CM

## 2015-11-08 DIAGNOSIS — M25532 Pain in left wrist: Secondary | ICD-10-CM

## 2015-11-08 DIAGNOSIS — S91141D Puncture wound with foreign body of right great toe without damage to nail, subsequent encounter: Secondary | ICD-10-CM

## 2015-11-08 DIAGNOSIS — M79661 Pain in right lower leg: Secondary | ICD-10-CM

## 2015-11-08 NOTE — Patient Instructions (Addendum)
Fall Prevention in the Home  Falls can cause injuries and can affect people from all age groups. There are many simple things that you can do to make your home safe and to help prevent falls. WHAT CAN I DO ON THE OUTSIDE OF MY HOME?  Regularly repair the edges of walkways and driveways and fix any cracks.  Remove high doorway thresholds.  Trim any shrubbery on the main path into your home.  Use bright outdoor lighting.  Clear walkways of debris and clutter, including tools and rocks.  Regularly check that handrails are securely fastened and in good repair. Both sides of any steps should have handrails.  Install guardrails along the edges of any raised decks or porches.  Have leaves, snow, and ice cleared regularly.  Use sand or salt on walkways during winter months.  In the garage, clean up any spills right away, including grease or oil spills. WHAT CAN I DO IN THE BATHROOM?  Use night lights.  Install grab bars by the toilet and in the tub and shower. Do not use towel bars as grab bars.  Use non-skid mats or decals on the floor of the tub or shower.  If you need to sit down while you are in the shower, use a plastic, non-slip stool..  Keep the floor dry. Immediately clean up any water that spills on the floor.  Remove soap buildup in the tub or shower on a regular basis.  Attach bath mats securely with double-sided non-slip rug tape.  Remove throw rugs and other tripping hazards from the floor. WHAT CAN I DO IN THE BEDROOM?  Use night lights.  Make sure that a bedside light is easy to reach.  Do not use oversized bedding that drapes onto the floor.  Have a firm chair that has side arms to use for getting dressed.  Remove throw rugs and other tripping hazards from the floor. WHAT CAN I DO IN THE KITCHEN?   Clean up any spills right away.  Avoid walking on wet floors.  Place frequently used items in easy-to-reach places.  If you need to reach for something  above you, use a sturdy step stool that has a grab bar.  Keep electrical cables out of the way.  Do not use floor polish or wax that makes floors slippery. If you have to use wax, make sure that it is non-skid floor wax.  Remove throw rugs and other tripping hazards from the floor. WHAT CAN I DO IN THE STAIRWAYS?  Do not leave any items on the stairs.  Make sure that there are handrails on both sides of the stairs. Fix handrails that are broken or loose. Make sure that handrails are as long as the stairways.  Check any carpeting to make sure that it is firmly attached to the stairs. Fix any carpet that is loose or worn.  Avoid having throw rugs at the top or bottom of stairways, or secure the rugs with carpet tape to prevent them from moving.  Make sure that you have a light switch at the top of the stairs and the bottom of the stairs. If you do not have them, have them installed. WHAT ARE SOME OTHER FALL PREVENTION TIPS?  Wear closed-toe shoes that fit well and support your feet. Wear shoes that have rubber soles or low heels.  When you use a stepladder, make sure that it is completely opened and that the sides are firmly locked. Have someone hold the ladder while you   are using it. Do not climb a closed stepladder.  Add color or contrast paint or tape to grab bars and handrails in your home. Place contrasting color strips on the first and last steps.  Use mobility aids as needed, such as canes, walkers, scooters, and crutches.  Turn on lights if it is dark. Replace any light bulbs that burn out.  Set up furniture so that there are clear paths. Keep the furniture in the same spot.  Fix any uneven floor surfaces.  Choose a carpet design that does not hide the edge of steps of a stairway.  Be aware of any and all pets.  Review your medicines with your healthcare provider. Some medicines can cause dizziness or changes in blood pressure, which increase your risk of falling. Talk  with your health care provider about other ways that you can decrease your risk of falls. This may include working with a physical therapist or trainer to improve your strength, balance, and endurance.   This information is not intended to replace advice given to you by your health care provider. Make sure you discuss any questions you have with your health care provider.   Document Released: 12/15/2001 Document Revised: 05/11/2014 Document Reviewed: 01/29/2014 Elsevier Interactive Patient Education 2016 [place] Prevention in the Home  Falls can cause injuries and can affect people from all age groups. There are many simple things that you can do to make your home safe and to help prevent falls. WHAT CAN I DO ON THE OUTSIDE OF MY HOME?  Regularly repair the edges of walkways and driveways and fix any cracks.  Remove high doorway thresholds.  Trim any shrubbery on the main path into your home.  Use bright outdoor lighting.  Clear walkways of debris and clutter, including tools and rocks.  Regularly check that handrails are securely fastened and in good repair. Both sides of any steps should have handrails.  Install guardrails along the edges of any raised decks or porches.  Have leaves, snow, and ice cleared regularly.  Use sand or salt on walkways during winter months.  In the garage, clean up any spills right away, including grease or oil spills. WHAT CAN I DO IN THE BATHROOM?  Use night lights.  Install grab bars by the toilet and in the tub and shower. Do not use towel bars as grab bars.  Use non-skid mats or decals on the floor of the tub or shower.  If you need to sit down while you are in the shower, use a plastic, non-slip stool.[place] Kitchen  Keep the floor dry. Immediately clean up any water that spills on the floor.  Remove soap buildup in the tub or shower on a regular basis.  Attach bath mats securely with double-sided non-slip rug tape.  Remove throw rugs  and other tripping hazards from the floor. WHAT CAN I DO IN THE BEDROOM?  Use night lights.  Make sure that a bedside light is easy to reach.  Do not use oversized bedding that drapes onto the floor.  Have a firm chair that has side arms to use for getting dressed.  Remove throw rugs and other tripping hazards from the floor. WHAT CAN I DO IN THE KITCHEN?   Clean up any spills right away.  Avoid walking on wet floors.  Place frequently used items in easy-to-reach places.  If you need to reach for something above you, use a sturdy step stool that has a grab bar.  Keep  electrical cables out of the way.  Do not use floor polish or wax that makes floors slippery. If you have to use wax, make sure that it is non-skid floor wax.  Remove throw rugs and other tripping hazards from the floor. WHAT CAN I DO IN THE STAIRWAYS?  Do not leave any items on the stairs.  Make sure that there are handrails on both sides of the stairs. Fix handrails that are broken or loose. Make sure that handrails are as long as the stairways.  Check any carpeting to make sure that it is firmly attached to the stairs. Fix any carpet that is loose or worn.  Avoid having throw rugs at the top or bottom of stairways, or secure the rugs with carpet tape to prevent them from moving.  Make sure that you have a light switch at the top of the stairs and the bottom of the stairs. If you do not have them, have them installed. WHAT ARE SOME OTHER FALL PREVENTION TIPS?  Wear closed-toe shoes that fit well and support your feet. Wear shoes that have rubber soles or low heels.  When you use a stepladder, make sure that it is completely opened and that the sides are firmly locked. Have someone hold the ladder while you are using it. Do not climb a closed stepladder.  Add color or contrast paint or tape to grab bars and handrails in your home. Place contrasting color strips on the first and last steps.  Use mobility  aids as needed, such as canes, walkers, scooters, and crutches.  Turn on lights if it is dark. Replace any light bulbs that burn out.  Set up furniture so that there are clear paths. Keep the furniture in the same spot.  Fix any uneven floor surfaces.  Choose a carpet design that does not hide the edge of steps of a stairway.  Be aware of any and all pets.  Review your medicines with your healthcare provider. Some medicines can cause dizziness or changes in blood pressure, which increase your risk of falling. Talk with your health care provider about other ways that you can decrease your risk of falls. This may include working with a physical therapist or trainer to improve your strength, balance, and endurance.   This information is not intended to replace advice given to you by your health care provider. Make sure you discuss any questions you have with your health care provider.   Document Released: 12/15/2001 Document Revised: 05/11/2014 Document Reviewed: 01/29/2014 Elsevier Interactive Patient Education 2016 [place] wrist/hand pain patient instructions here.

## 2015-11-08 NOTE — Progress Notes (Signed)
Subjective:    Candace [name] is a 82 y.o. female who presents for evaluation of left hand/finger pain. Onset was sudden, related to a fall from standing. Mechanism of injury: fall. The pain is severe, worsens with movement, and is relieved by rest. There is no associated numbness, tingling, weakness in left arm. Evaluation to date: plain films: normal. Treatment to date: OTC analgesics, ice.  The following portions of the patient's history were reviewed and updated as appropriate: allergies, current medications, past family history, past medical history, past social history, past surgical history and problem list.  Review of Systems Pertinent items are noted in HPI.    Objective:     BP 140/80 (BP Location: Right Arm, Cuff Size: Normal)   Pulse 84   Temp 97.8 F (36.6 C) (Oral)   Ht 4\' 3"  (1.295 m)   Wt 95 lb (43.1 kg)   BMI 25.68 kg/m   Right hand:  normal exam, no swelling, tenderness, instability; ligaments intact, full ROM both hands, wrists, and finger joints  Left hand:  normal exam, no swelling, tenderness, instability; ligaments intact, full ROM both hands, wrists, and finger joints   Imaging: X-ray left hand: per radiologist report, no definite acute osseous abnormality noted    Assessment:    Hand strain    Plan:      1. Pain of left hand  - DG Hand Complete Left; Future  2. Hand strain, left, initial encounter Rest, Ice, compression, and elevate left hand as needed  Jari Favre, RN, BSN, AGNP Student

## 2015-11-08 NOTE — Therapy (Signed)
[place], [place], [address] Phone: [phone]   Fax:  [phone]  Wound Care Therapy  Patient Details  Name: Candace [name] MRN: [id number] Date of Birth: 02/16/1922 Referring Provider: Particia Nearing  Encounter Date: 11/08/2015      PT End of Session - 11/08/15 1424    Visit Number 7   Number of Visits 14   Date for PT Re-Evaluation 11/23/15   Authorization Type [employer] medicare   Authorization - Visit Number 7   Authorization - Number of Visits 10   PT Start Time 1120   PT Stop Time 1200   PT Time Calculation (min) 40 min   Activity Tolerance Patient tolerated treatment well;Patient limited by pain   Behavior During Therapy [facility] for tasks assessed/performed      Past Medical History:  Diagnosis Date  . Aortic stenosis   . Atrial fibrillation ([place])   . DVT (deep venous thrombosis) ([place])   . Essential hypertension   . Glaucoma   . History of skin cancer 2016  . Hypercholesterolemia   . PE (pulmonary embolism)     Past Surgical History:  Procedure Laterality Date  . CATARACT EXTRACTION    . CHOLECYSTECTOMY    . KYPHOPLASTY    . SKIN CANCER EXCISION Left 2016   neck    There were no vitals filed for this visit.                  Wound Therapy - 11/08/15 1411    Subjective son reports patient fell again yesterday and bumper her leg.  Pt states she hit her Rt leg on the recliner lever as she was walking past.   Comes today with additional wound on Rt anterior shin today..   Patient and Family Stated Goals Wounds to heal    Date of Onset 09/23/15   Prior Treatments peroxide to Lt wound and antibiotic.    Pain Assessment 0-10   Pain Score 6    Pain Type Chronic pain   Evaluation and Treatment Procedures Explained to Patient/Family Yes   Evaluation and Treatment Procedures agreed to   Wound Properties Date First Assessed: 10/24/15 Time First Assessed: [id number] Wound Type: Puncture  Location: Toe (Comment  which one) Location Orientation: Right Wound Description (Comments): plantar aspect of great toe    Dressing Type Gauze (Comment)  silver hydrofiber packed into wound   Dressing Changed Changed   Dressing Status Old drainage   Dressing Change Frequency PRN   Site / Wound Assessment Yellow   % Wound base Red or Granulating 20%   % Wound base Yellow 80%   Peri-wound Assessment Edema;Erythema (blanchable)   Drainage Amount Minimal   Drainage Description Serosanguineous   Treatment Hydrotherapy (Pulse lavage);Cleansed   Wound Properties Date First Assessed: 10/24/15 Time First Assessed: 1615 Wound Type: Other (Comment) Location: Leg Location Orientation: Left;Posterior;Lower Wound Description (Comments): dry Present on Admission: Yes   Dressing Type Gauze (Comment)  medihoney, kerlix and coban with netting   Dressing Changed Changed   Dressing Status Old drainage   Site / Wound Assessment Pink;Yellow;Painful;Granulation tissue;Dusky   % Wound base Red or Granulating 35%   % Wound base Yellow 65%   % Wound base Black 0%   Peri-wound Assessment Erythema (blanchable)   Margins Unattached edges (unapproximated)   Closure None   Drainage Amount Minimal   Treatment Hydrotherapy (Pulse lavage);Debridement (Selective)   Wound Properties Date First Assessed: 11/08/15 Time First  Assessed: 1130 Wound Type: Laceration Location: Leg Location Orientation: Right;Anterior Wound Description (Comments): Rt anterior shin, hit on recliner lever at home Present on Admission: No   Dressing Type None   Dressing Changed New   Dressing Status Intact   Dressing Change Frequency PRN   Site / Wound Assessment Bleeding;Painful;Red;Yellow;Purple   % Wound base Red or Granulating 50%   % Wound base Other (Comment) 50%  purplish, skin over may not be viable   Peri-wound Assessment Edema;Hemosiderin   Wound Length (cm) 3 cm   Wound Width (cm) 3 cm   Wound Depth (cm) 0 cm   Margins  Unattached edges (unapproximated)   Drainage Amount Moderate   Drainage Description Serosanguineous   Treatment Cleansed;Debridement (Selective)   Pulsed lavage therapy - wound location Lt posterior LE and Rt great toe   Pulsed Lavage with Suction (psi) 4 psi   Pulsed Lavage with Suction - Normal Saline Used 1000 mL   Pulsed Lavage Tip Tip with splash shield   Selective Debridement - Location Lt posterior LE and Rt anterior LE   Selective Debridement - Tools Used Forceps;Scalpel;Scissors   Selective Debridement - Tissue Removed slough    Wound Therapy - Clinical Statement Pt with new wound today on Rt shin approx 3cm in size and no depth.  Currently 50% granulated.  Cleansed area well and used silver hydrofiber dressing.  Little change in other 2 wounds this session, however no signs/symptoms of infection.  Will continue to need pulsed lavage to properly irrigate/cleanse Rt great toe wound.     Wound Therapy - Functional Problem List difficult walking    Factors Delaying/Impairing Wound Healing Altered sensation;Infection - systemic/local;Multiple medical problems;Polypharmacy;Vascular compromise   Hydrotherapy Plan Debridement;Dressing change;Patient/family education;Pulsatile lavage with suction   Wound Therapy - Frequency 3X / week   Wound Plan Pt to recieve pulse lavage, debridement and dressing change to all wounds on bilateral LE's.   Dressing  Rt great toe: medihoney gel packed into wound followed by 2x2 and 2" kling with netting to hold dressing in place.    Dressing Lt LE medihoney gel, kerlix, coban and #6 netting   Dressing Rt shin:  silver hydrofiber, gauze, kerlix, coban and netting                   PT Short Term Goals - 10/27/15 1559      PT SHORT TERM GOAL #1   Title Pt Lt LE to have 50% granulation to decrease risk of infection   Time 3   Period Weeks   Status On-going     PT SHORT TERM GOAL #2   Title Pt and familty to be able to verbalize the signs and  symptoms of infection and the importance of seeking medical help   Time 1   Period Weeks   Status On-going     PT SHORT TERM GOAL #3   Title Pt Rt toe wound to no longer have purulent drainage to demonstrate decreased infection    Time 3   Period Weeks   Status On-going     PT SHORT TERM GOAL #4   Title Pt pain in both legs to be no greater than a 5/10 to allow pt to ambulate in comfort in her home   Time 3   Period Weeks   Status On-going           PT Long Term Goals - 10/27/15 1559      PT LONG TERM GOAL #1  Title Lt LE wound to be healed    Time 6   Period Weeks   Status On-going     PT LONG TERM GOAL #2   Title Rt great toe wound to have filled in depth to no greater than .3 and minimal undermining to allow family to be comfortable with dressing wound    Time 6   Period Weeks   Status On-going             Patient will benefit from skilled therapeutic intervention in order to improve the following deficits and impairments:     Visit Diagnosis: Pain in right lower leg  Difficulty in walking, not elsewhere classified  Puncture wound of right great toe with foreign body without damage to nail, subsequent encounter  Pain in left lower leg  Non-pressure chronic ulcer of left calf with necrosis of muscle ([place])     Problem List Patient Active Problem List   Diagnosis Date Noted  . Cellulitis 10/25/2015  . Cellulitis of right foot 10/24/2015  . Hypokalemia 10/24/2015  . Bradycardia 10/24/2015  . Cellulitis of foot, right 10/24/2015  . Wound infection   . Aortic atherosclerosis ([place]) 09/01/2015  . Sick sinus syndrome ([place]) 08/11/2015  . Non-sustained ventricular tachycardia ([place]) 08/11/2015  . Anticoagulated on Coumadin 08/11/2015  . CKD (chronic kidney disease) stage 3, GFR 30-59 ml/min 08/09/2015  . Elevated troponin 08/08/2015  . Renal insufficiency 08/08/2015  . Severe aortic stenosis 08/05/2015  . Hyperlipidemia 06/10/2015  . Tobacco use  disorder 01/28/2014  . Osteoporosis with pathological fracture 08/13/2012  . DVT (deep venous thrombosis), right 05/13/2012  . HTN (hypertension) 05/13/2012  . Long term current use of anticoagulant therapy 05/13/2012  . Atrial fibrillation ([place]) 04/15/2012    Teena Irani, PTA/CLT [id number]  11/08/2015, 2:25 PM  [facility] [address] [place], [place], [address] Phone: [phone]   Fax:  [phone]  Name: Candace [name] MRN: [id number] Date of Birth: [birth year]

## 2015-11-10 ENCOUNTER — Ambulatory Visit (HOSPITAL_COMMUNITY): Payer: Medicare Other | Attending: Physician Assistant | Admitting: Physical Therapy

## 2015-11-10 DIAGNOSIS — S91141D Puncture wound with foreign body of right great toe without damage to nail, subsequent encounter: Secondary | ICD-10-CM | POA: Diagnosis not present

## 2015-11-10 DIAGNOSIS — M79661 Pain in right lower leg: Secondary | ICD-10-CM | POA: Insufficient documentation

## 2015-11-10 DIAGNOSIS — M79662 Pain in left lower leg: Secondary | ICD-10-CM | POA: Diagnosis not present

## 2015-11-10 DIAGNOSIS — R262 Difficulty in walking, not elsewhere classified: Secondary | ICD-10-CM | POA: Insufficient documentation

## 2015-11-10 DIAGNOSIS — X58XXXD Exposure to other specified factors, subsequent encounter: Secondary | ICD-10-CM | POA: Diagnosis not present

## 2015-11-10 DIAGNOSIS — L97223 Non-pressure chronic ulcer of left calf with necrosis of muscle: Secondary | ICD-10-CM | POA: Diagnosis not present

## 2015-11-10 NOTE — Therapy (Signed)
Candace [name], [place], [address] Phone: [phone]   Fax:  [phone]  Wound Care Therapy  Patient Details  Name: Candace [name] MRN: [id number] Date of Birth: August 19, 1922 Referring Provider: Particia Nearing  Encounter Date: 11/10/2015      PT End of Session - 11/10/15 1208    Visit Number 8   Number of Visits 14   Date for PT Re-Evaluation 11/23/15   Authorization Type [employer] medicare   Authorization - Visit Number 8   Authorization - Number of Visits 10   PT Start Time 0945   PT Stop Time 1035   PT Time Calculation (min) 50 min   Activity Tolerance Patient tolerated treatment well;Patient limited by pain   Behavior During Therapy [facility] for tasks assessed/performed      Past Medical History:  Diagnosis Date  . Aortic stenosis   . Atrial fibrillation ([place])   . DVT (deep venous thrombosis) ([place])   . Essential hypertension   . Glaucoma   . History of skin cancer 2016  . Hypercholesterolemia   . PE (pulmonary embolism)     Past Surgical History:  Procedure Laterality Date  . CATARACT EXTRACTION    . CHOLECYSTECTOMY    . KYPHOPLASTY    . SKIN CANCER EXCISION Left 2016   neck    There were no vitals filed for this visit.                  Wound Therapy - 11/10/15 1147    Subjective Pt states that her legs hurt.  The left hurts greater than her right toe.    Patient and Family Stated Goals Wounds to heal    Date of Onset 09/23/15   Prior Treatments peroxide to Lt wound and antibiotic.    Pain Assessment 0-10   Pain Score 5    Pain Type Chronic pain   Pain Location Leg   Pain Orientation Left;Posterior   Pain Descriptors / Indicators Aching;Burning   Pain Onset On-going   Patients Stated Pain Goal 0   Pain Intervention(s) Emotional support   Multiple Pain Sites Yes   Pain Score 3   Pain Type Chronic pain   Pain Location Toe (Comment which one)   Pain Orientation Right  plantar aspect     Pain Onset With Activity   Patient's Stated Pain Goal 0   Evaluation and Treatment Procedures Explained to Patient/Family Yes   Evaluation and Treatment Procedures agreed to   Wound Properties Date First Assessed: 10/24/15 Time First Assessed: 1530 Wound Type: Puncture Location: Toe (Comment  which one) Location Orientation: Right Wound Description (Comments): plantar aspect of great toe    Dressing Type Gauze (Comment)  silver hydrofiber packed into wound   Dressing Status Old drainage   Dressing Change Frequency PRN   Site / Wound Assessment Yellow   % Wound base Red or Granulating 40%   % Wound base Yellow 60%   Peri-wound Assessment Intact   Drainage Amount Copious   Drainage Description Purulent   Treatment Cleansed;Hydrotherapy (Pulse lavage)   Wound Properties Date First Assessed: 10/24/15 Time First Assessed: 1615 Wound Type: Other (Comment) Location: Leg Location Orientation: Left;Posterior;Lower Wound Description (Comments): dry Present on Admission: Yes   Dressing Type Gauze (Comment)  medihoney, kerlix and coban with netting   Dressing Status Old drainage   Site / Wound Assessment Pink;Yellow;Painful;Granulation tissue;Dusky   % Wound base Red or Granulating 35%   % Wound  base Yellow 65%   % Wound base Black 0%   Peri-wound Assessment Erythema (blanchable)   Margins Unattached edges (unapproximated)   Closure None   Drainage Amount Minimal   Treatment Cleansed;Debridement (Selective);Hydrotherapy (Pulse lavage)   Wound Properties Date First Assessed: 11/08/15 Time First Assessed: 1130 Wound Type: Laceration Location: Leg Location Orientation: Right;Anterior Wound Description (Comments): Rt anterior shin, hit on recliner lever at home Present on Admission: No   Dressing Type None   Dressing Status Intact   Dressing Change Frequency PRN   Site / Wound Assessment Bleeding;Painful;Red;Yellow;Purple   % Wound base Red or Granulating 65%   % Wound base Yellow 35%   %  Wound base Other (Comment) 50%  purplish, skin over may not be viable   Peri-wound Assessment Edema;Hemosiderin   Margins Unattached edges (unapproximated)   Drainage Amount Minimal   Drainage Description Serous   Treatment Cleansed;Debridement (Selective);Hydrotherapy (Pulse lavage)   Pulsed lavage therapy - wound location to all wounds    Pulsed Lavage with Suction (psi) 4 psi   Pulsed Lavage with Suction - Normal Saline Used 1000 mL   Pulsed Lavage Tip Tip with splash shield   Selective Debridement - Location To all wounds    Selective Debridement - Tools Used Forceps;Scalpel;Scissors   Selective Debridement - Tissue Removed slough    Wound Therapy - Clinical Statement Pt had significant thick purulent drainage come from Rt greate toe today; no increased redness, edema or heat.  Pt dressings have slide down  on both LE's.  Therapist brought dressings up onto thight in attempt to keep dressings from sliding down.     Wound Therapy - Functional Problem List difficult walking    Factors Delaying/Impairing Wound Healing Altered sensation;Infection - systemic/local;Multiple medical problems;Polypharmacy;Vascular compromise   Hydrotherapy Plan Debridement;Dressing change;Patient/family education;Pulsatile lavage with suction   Wound Therapy - Frequency 3X / week   Wound Plan Pt to recieve pulse lavage, debridement and dressing change to all wounds on bilateral LE's.   Dressing  Rt great toe: silverhydrofiber packed into wound followed by 2x2 and 2" kling; medihoney to other wounds on Rt LE with 4x4 followed by kerlix, cotton and coban with netting to hold dressing in place.    Dressing Lt LE silver hydrofiber to posterior wound/ medihoney with 4x4 , kerlix, coban and #6 netting                   PT Short Term Goals - 10/27/15 1559      PT SHORT TERM GOAL #1   Title Pt Lt LE to have 50% granulation to decrease risk of infection   Time 3   Period Weeks   Status On-going     PT  SHORT TERM GOAL #2   Title Pt and familty to be able to verbalize the signs and symptoms of infection and the importance of seeking medical help   Time 1   Period Weeks   Status On-going     PT SHORT TERM GOAL #3   Title Pt Rt toe wound to no longer have purulent drainage to demonstrate decreased infection    Time 3   Period Weeks   Status On-going     PT SHORT TERM GOAL #4   Title Pt pain in both legs to be no greater than a 5/10 to allow pt to ambulate in comfort in her home   Time 3   Period Weeks   Status On-going  PT Long Term Goals - 10/27/15 1559      PT LONG TERM GOAL #1   Title Lt LE wound to be healed    Time 6   Period Weeks   Status On-going     PT LONG TERM GOAL #2   Title Rt great toe wound to have filled in depth to no greater than .3 and minimal undermining to allow family to be comfortable with dressing wound    Time 6   Period Weeks   Status On-going               Plan - 11/10/15 1208    Clinical Impression Statement see above    Rehab Potential Good   PT Frequency 3x / week   PT Duration 6 weeks   PT Treatment/Interventions ADLs/Self Care Home Management;Patient/family education;Other (comment)  debridement and dressing change   PT Next Visit Plan continue with pulse lavage, debridement and dressing change   Consulted and Agree with Plan of Care Patient      Patient will benefit from skilled therapeutic intervention in order to improve the following deficits and impairments:  Pain, Difficulty walking, Decreased skin integrity  Visit Diagnosis: Pain in right lower leg  Difficulty in walking, not elsewhere classified  Puncture wound of right great toe with foreign body without damage to nail, subsequent encounter  Pain in left lower leg  Non-pressure chronic ulcer of left calf with necrosis of muscle ([place])     Problem List Patient Active Problem List   Diagnosis Date Noted  . Pain of left hand 11/08/2015  . Hand  strain, left, initial encounter 11/08/2015  . Cellulitis 10/25/2015  . Cellulitis of right foot 10/24/2015  . Hypokalemia 10/24/2015  . Bradycardia 10/24/2015  . Cellulitis of foot, right 10/24/2015  . Wound infection   . Aortic atherosclerosis ([place]) 09/01/2015  . Sick sinus syndrome ([place]) 08/11/2015  . Non-sustained ventricular tachycardia ([place]) 08/11/2015  . Anticoagulated on Coumadin 08/11/2015  . CKD (chronic kidney disease) stage 3, GFR 30-59 ml/min 08/09/2015  . Elevated troponin 08/08/2015  . Renal insufficiency 08/08/2015  . Severe aortic stenosis 08/05/2015  . Hyperlipidemia 06/10/2015  . Tobacco use disorder 01/28/2014  . Osteoporosis with pathological fracture 08/13/2012  . DVT (deep venous thrombosis), right 05/13/2012  . HTN (hypertension) 05/13/2012  . Long term current use of anticoagulant therapy 05/13/2012  . Atrial fibrillation [facility]) 04/15/2012    Rayetta [name], PT CLT [phone] 11/10/2015, 12:09 PM  [facility] [address] [place], [place], [address] Phone: [phone]   Fax:  [phone]  Name: MAZEY BALTON MRN: [id number] Date of Birth: 1922/05/06

## 2015-11-13 DIAGNOSIS — R0789 Other chest pain: Secondary | ICD-10-CM | POA: Diagnosis not present

## 2015-11-13 DIAGNOSIS — S29011A Strain of muscle and tendon of front wall of thorax, initial encounter: Secondary | ICD-10-CM | POA: Diagnosis not present

## 2015-11-15 ENCOUNTER — Ambulatory Visit (HOSPITAL_COMMUNITY): Payer: Medicare Other | Admitting: Physical Therapy

## 2015-11-15 DIAGNOSIS — L97223 Non-pressure chronic ulcer of left calf with necrosis of muscle: Secondary | ICD-10-CM

## 2015-11-15 DIAGNOSIS — S91141D Puncture wound with foreign body of right great toe without damage to nail, subsequent encounter: Secondary | ICD-10-CM | POA: Diagnosis not present

## 2015-11-15 DIAGNOSIS — R262 Difficulty in walking, not elsewhere classified: Secondary | ICD-10-CM | POA: Diagnosis not present

## 2015-11-15 DIAGNOSIS — M79662 Pain in left lower leg: Secondary | ICD-10-CM

## 2015-11-15 DIAGNOSIS — M79661 Pain in right lower leg: Secondary | ICD-10-CM | POA: Diagnosis not present

## 2015-11-15 NOTE — Therapy (Signed)
[place], [place], [address] Phone: [phone]   Fax:  [phone]  Wound Care Therapy  Patient Details  Name: Candace [name] MRN: [id number] Date of Birth: 05-Jan-1923 Referring Provider: Particia Nearing  Encounter Date: 11/15/2015      PT End of Session - 11/15/15 1138    Visit Number 9   Number of Visits 14   Date for PT Re-Evaluation 11/23/15   Authorization Type [employer] medicare   Authorization - Visit Number 9   Authorization - Number of Visits 10   PT Start Time 0945   PT Stop Time 1025   PT Time Calculation (min) 40 min   Activity Tolerance Patient tolerated treatment well   Behavior During Therapy [facility] for tasks assessed/performed      Past Medical History:  Diagnosis Date  . Aortic stenosis   . Atrial fibrillation ([place])   . DVT (deep venous thrombosis) ([place])   . Essential hypertension   . Glaucoma   . History of skin cancer 2016  . Hypercholesterolemia   . PE (pulmonary embolism)     Past Surgical History:  Procedure Laterality Date  . CATARACT EXTRACTION    . CHOLECYSTECTOMY    . KYPHOPLASTY    . SKIN CANCER EXCISION Left 2016   neck    There were no vitals filed for this visit.                  Wound Therapy - 11/15/15 1125    Subjective Pt reports no complaints today.   Patient and Family Stated Goals Wounds to heal    Date of Onset 09/23/15   Prior Treatments peroxide to Lt wound and antibiotic.    Pain Assessment No/denies pain   Evaluation and Treatment Procedures Explained to Patient/Family Yes   Evaluation and Treatment Procedures agreed to   Wound Properties Date First Assessed: 10/24/15 Time First Assessed: [id number] Wound Type: Puncture Location: Toe (Comment  which one) Location Orientation: Right Wound Description (Comments): plantar aspect of great toe    Dressing Type Gauze (Comment)  silver hydrofiber packed into wound   Dressing Changed Changed   Dressing  Status Old drainage   Dressing Change Frequency PRN   Site / Wound Assessment Yellow   % Wound base Red or Granulating 50%   % Wound base Yellow 50%   Peri-wound Assessment Intact   Drainage Amount Minimal   Drainage Description Serosanguineous   Treatment Hydrotherapy (Pulse lavage);Debridement (Selective)   Wound Properties Date First Assessed: 10/24/15 Time First Assessed: 1615 Wound Type: Other (Comment) Location: Leg Location Orientation: Left;Posterior;Lower Wound Description (Comments): dry Present on Admission: Yes   Dressing Type Gauze (Comment)  medihoney, kerlix and coban with netting   Dressing Changed Changed   Dressing Status Old drainage   Site / Wound Assessment Pink;Yellow;Painful;Granulation tissue;Dusky   % Wound base Red or Granulating 35%   % Wound base Yellow 65%   % Wound base Black 0%   Peri-wound Assessment Erythema (blanchable)   Margins Unattached edges (unapproximated)   Closure None   Drainage Amount Minimal   Treatment Hydrotherapy (Pulse lavage);Debridement (Selective)   Wound Properties Date First Assessed: 11/08/15 Time First Assessed: 1130 Wound Type: Laceration Location: Leg Location Orientation: Right;Anterior Wound Description (Comments): Rt anterior shin, hit on recliner lever at home Present on Admission: No   Dressing Type None   Dressing Changed Changed   Dressing Status Intact   Dressing Change Frequency PRN  Site / Wound Assessment Bleeding;Painful;Red;Yellow;Purple   % Wound base Red or Granulating 70%   % Wound base Yellow 30%   % Wound base Other (Comment) 50%  purplish, skin over may not be viable   Peri-wound Assessment Edema;Hemosiderin   Margins Unattached edges (unapproximated)   Drainage Amount Minimal   Drainage Description Serous   Treatment Hydrotherapy (Pulse lavage);Debridement (Selective)   Pulsed lavage therapy - wound location to all wounds    Pulsed Lavage with Suction (psi) 4 psi   Pulsed Lavage with Suction -  Normal Saline Used 1000 mL   Pulsed Lavage Tip Tip with splash shield   Selective Debridement - Location To all wounds    Selective Debridement - Tools Used Forceps;Scalpel;Scissors   Selective Debridement - Tissue Removed slough    Wound Therapy - Clinical Statement Minimal drainage from Rt great toe this session.  Little change in Lt posterior LE wound, however Rt anterior LE wound much improved with increased granulation.  Noted compression rings at top of dressings where excess bandages rolled down    Wound Therapy - Functional Problem List difficult walking    Factors Delaying/Impairing Wound Healing Altered sensation;Infection - systemic/local;Multiple medical problems;Polypharmacy;Vascular compromise   Hydrotherapy Plan Debridement;Dressing change;Patient/family education;Pulsatile lavage with suction   Wound Therapy - Frequency 3X / week   Wound Plan Pt to recieve pulse lavage, debridement and dressing change to all wounds on bilateral LE's.   Dressing  Rt great toe: silverhydrofiber packed into wound followed by 2x2 and 2" kling;anterior shin silver hydrofiber followed by kerlix, cotton and coban with netting to hold dressing in place.    Dressing Lt LE silver hydrofiber to posterior wound , kerlix, coban and #6 netting                   PT Short Term Goals - 10/27/15 1559      PT SHORT TERM GOAL #1   Title Pt Lt LE to have 50% granulation to decrease risk of infection   Time 3   Period Weeks   Status On-going     PT SHORT TERM GOAL #2   Title Pt and familty to be able to verbalize the signs and symptoms of infection and the importance of seeking medical help   Time 1   Period Weeks   Status On-going     PT SHORT TERM GOAL #3   Title Pt Rt toe wound to no longer have purulent drainage to demonstrate decreased infection    Time 3   Period Weeks   Status On-going     PT SHORT TERM GOAL #4   Title Pt pain in both legs to be no greater than a 5/10 to allow pt to  ambulate in comfort in her home   Time 3   Period Weeks   Status On-going           PT Long Term Goals - 10/27/15 1559      PT LONG TERM GOAL #1   Title Lt LE wound to be healed    Time 6   Period Weeks   Status On-going     PT LONG TERM GOAL #2   Title Rt great toe wound to have filled in depth to no greater than .3 and minimal undermining to allow family to be comfortable with dressing wound    Time 6   Period Weeks   Status On-going  Plan - 11/15/15 1139    Rehab Potential Good   PT Frequency 3x / week   PT Duration 6 weeks   PT Treatment/Interventions ADLs/Self Care Home Management;Patient/family education;Other (comment)  debridement and dressing change   PT Next Visit Plan continue with pulse lavage, debridement and dressing change   Consulted and Agree with Plan of Care Patient      Patient will benefit from skilled therapeutic intervention in order to improve the following deficits and impairments:  Pain, Difficulty walking, Decreased skin integrity  Visit Diagnosis: Pain in right lower leg  Difficulty in walking, not elsewhere classified  Puncture wound of right great toe with foreign body without damage to nail, subsequent encounter  Pain in left lower leg  Non-pressure chronic ulcer of left calf with necrosis of muscle ([place])     Problem List Patient Active Problem List   Diagnosis Date Noted  . Pain of left hand 11/08/2015  . Hand strain, left, initial encounter 11/08/2015  . Cellulitis 10/25/2015  . Cellulitis of right foot 10/24/2015  . Hypokalemia 10/24/2015  . Bradycardia 10/24/2015  . Cellulitis of foot, right 10/24/2015  . Wound infection   . Aortic atherosclerosis ([place]) 09/01/2015  . Sick sinus syndrome ([place]) 08/11/2015  . Non-sustained ventricular tachycardia ([place]) 08/11/2015  . Anticoagulated on Coumadin 08/11/2015  . CKD (chronic kidney disease) stage 3, GFR 30-59 ml/min 08/09/2015  . Elevated troponin  08/08/2015  . Renal insufficiency 08/08/2015  . Severe aortic stenosis 08/05/2015  . Hyperlipidemia 06/10/2015  . Tobacco use disorder 01/28/2014  . Osteoporosis with pathological fracture 08/13/2012  . DVT (deep venous thrombosis), right 05/13/2012  . HTN (hypertension) 05/13/2012  . Long term current use of anticoagulant therapy 05/13/2012  . Atrial fibrillation ([place]) 04/15/2012    Teena Irani, PTA/CLT [phone]  11/15/2015, 11:39 AM  [facility] [address] [place], [place], [address] Phone: [phone]   Fax:  [phone]  Name: Candace [name] MRN: [id number] Date of Birth: 1922-08-04

## 2015-11-17 ENCOUNTER — Ambulatory Visit (HOSPITAL_COMMUNITY): Payer: Medicare Other | Admitting: Physical Therapy

## 2015-11-17 DIAGNOSIS — S91141D Puncture wound with foreign body of right great toe without damage to nail, subsequent encounter: Secondary | ICD-10-CM | POA: Diagnosis not present

## 2015-11-17 DIAGNOSIS — R262 Difficulty in walking, not elsewhere classified: Secondary | ICD-10-CM

## 2015-11-17 DIAGNOSIS — L97223 Non-pressure chronic ulcer of left calf with necrosis of muscle: Secondary | ICD-10-CM

## 2015-11-17 DIAGNOSIS — M79662 Pain in left lower leg: Secondary | ICD-10-CM | POA: Diagnosis not present

## 2015-11-17 DIAGNOSIS — M79661 Pain in right lower leg: Secondary | ICD-10-CM

## 2015-11-17 NOTE — Therapy (Addendum)
[place], [place], [address] Phone: [phone]   Fax:  [phone]  Wound Care Therapy  Patient Details  Name: Candace [name] MRN: [id number] Date of Birth: 02-Aug-1922 Referring Provider: Particia Nearing  Encounter Date: 11/17/2015      PT End of Session - 11/17/15 1133    Visit Number 10   Number of Visits 18   Date for PT Re-Evaluation 11/23/15   Authorization Type [place] - Visit Number 10 gcode done visit 10 recert needed 73/22   Authorization - Number of Visits 18   PT Start Time 0945   PT Stop Time 1030   PT Time Calculation (min) 45 min   Activity Tolerance Patient tolerated treatment well   Behavior During Therapy [facility] for tasks assessed/performed      Past Medical History:  Diagnosis Date  . Aortic stenosis   . Atrial fibrillation ([place])   . DVT (deep venous thrombosis) ([place])   . Essential hypertension   . Glaucoma   . History of skin cancer 2016  . Hypercholesterolemia   . PE (pulmonary embolism)     Past Surgical History:  Procedure Laterality Date  . CATARACT EXTRACTION    . CHOLECYSTECTOMY    . KYPHOPLASTY    . SKIN CANCER EXCISION Left 2016   neck    There were no vitals filed for this visit.                  Wound Therapy - 11/17/15 1125    Subjective Pt states that her legs were wrapped to tightly last time.     Patient and Family Stated Goals Wounds to heal    Date of Onset 09/23/15   Prior Treatments peroxide to Lt wound and antibiotic.    Pain Assessment 0-10   Pain Score 6    Pain Type Chronic pain   Pain Location Leg   Pain Orientation Left;Posterior   Pain Descriptors / Indicators Aching;Burning   Pain Onset On-going   Patients Stated Pain Goal 0   Pain Intervention(s) Emotional support   Multiple Pain Sites No   Evaluation and Treatment Procedures Explained to Patient/Family Yes   Evaluation and Treatment Procedures agreed to    Wound Properties Date First Assessed: 10/24/15 Time First Assessed: 1530 Wound Type: Puncture Location: Toe (Comment  which one) Location Orientation: Right Wound Description (Comments): plantar aspect of great toe    Dressing Type Gauze (Comment)  silver hydrofiber packed into wound   Dressing Changed Changed   Dressing Status Old drainage   Dressing Change Frequency PRN   Site / Wound Assessment Red;Yellow   % Wound base Red or Granulating 60%   % Wound base Yellow/Fibrinous Exudate 40%   Peri-wound Assessment Intact;Maceration   Drainage Amount Moderate   Drainage Description Serous   Treatment Cleansed;Debridement (Selective);Hydrotherapy (Pulse lavage)   Wound Properties Date First Assessed: 10/24/15 Time First Assessed: 1615 Wound Type: Other (Comment) Location: Leg Location Orientation: Left;Posterior;Lower Wound Description (Comments): dry Present on Admission: Yes   Dressing Type Gauze (Comment)  medihoney, kerlix and coban with netting   Dressing Changed Changed   Dressing Status Old drainage   Site / Wound Assessment Pink;Yellow;Painful;Granulation tissue;Dusky   % Wound base Red or Granulating 35%   % Wound base Yellow/Fibrinous Exudate 65%   % Wound base Black/Eschar 0%   Peri-wound Assessment Erythema (blanchable)   Margins Unattached edges (unapproximated)   Closure None  Drainage Amount Minimal   Treatment Cleansed;Debridement (Selective);Hydrotherapy (Pulse lavage)   Wound Properties Date First Assessed: 11/08/15 Time First Assessed: 1130 Wound Type: Laceration Location: Leg Location Orientation: Right;Anterior Wound Description (Comments): Rt anterior shin, hit on recliner lever at home Present on Admission: No   Dressing Type None   Dressing Changed Changed   Dressing Status Intact   Dressing Change Frequency PRN   Site / Wound Assessment Bleeding;Painful;Red;Yellow   % Wound base Red or Granulating 60%   % Wound base Yellow/Fibrinous Exudate 40%   % Wound base  Other/Granulation Tissue (Comment) 0%  purplish, skin over may not be viable   Peri-wound Assessment Edema;Hemosiderin   Margins Unattached edges (unapproximated)   Drainage Amount Minimal   Drainage Description Serous   Treatment Cleansed;Debridement (Selective);Hydrotherapy (Pulse lavage)   Pulsed lavage therapy - wound location to all wounds    Pulsed Lavage with Suction (psi) 4 psi   Pulsed Lavage with Suction - Normal Saline Used 1000 mL   Pulsed Lavage Tip Tip with splash shield   Selective Debridement - Location To all wounds    Selective Debridement - Tools Used Forceps;Scalpel;Scissors   Selective Debridement - Tissue Removed slough    Wound Therapy - Clinical Statement  increased drainage from Rt great toe this session.  Little change in Lt posterior LE wound or  Rt anterior LE wound.  Pt continues not to be tolerant of debridement procedures.     Wound Therapy - Functional Problem List difficult walking    Factors Delaying/Impairing Wound Healing Altered sensation;Infection - systemic/local;Multiple medical problems;Polypharmacy;Vascular compromise   Hydrotherapy Plan Debridement;Dressing change;Patient/family education;Pulsatile lavage with suction   Wound Therapy - Frequency 3X / week x 8 weeks total   Wound Plan Pt to recieve pulse lavage, debridement and dressing change to all wounds on bilateral LE's.   Dressing  Rt great toe: silverhydrofiber packed into wound followed by 2x2 and 2" kling;   Dressing Lt LE  and Rt anterior wound medihoney followed by 4x4; 3"kling, cotton and coban.d , kerlix, coban and #6 netting                   PT Short Term Goals - 11/17/15 1135      PT SHORT TERM GOAL #1   Title Pt Lt LE to have 50% granulation to decrease risk of infection   Time 3   Period Weeks   Status On-going     PT SHORT TERM GOAL #2   Title Pt and familty to be able to verbalize the signs and symptoms of infection and the importance of seeking medical help    Time 1   Period Weeks   Status Achieved     PT SHORT TERM GOAL #3   Title Pt Rt toe wound to no longer have purulent drainage to demonstrate decreased infection    Time 3   Period Weeks   Status Achieved     PT SHORT TERM GOAL #4   Title Pt pain in both legs to be no greater than a 5/10 to allow pt to ambulate in comfort in her home   Baseline No pain in Rt LE 11/17/2015   Time 3   Period Weeks   Status Partially Met           PT Long Term Goals - 11/17/15 1135      PT LONG TERM GOAL #1   Title Lt LE wound to be healed    Time 6  Period Weeks   Status Not Met     PT LONG TERM GOAL #2   Title Rt great toe wound to have filled in depth to no greater than .3 and minimal undermining to allow family to be comfortable with dressing wound    Time 6   Period Weeks   Status On-going               Plan - 12/12/2015 1134    Clinical Impression Statement see above   Rehab Potential Good   PT Frequency 3x / week   PT Duration 6 weeks   PT Treatment/Interventions ADLs/Self Care Home Management;Patient/family education;Other (comment)  debridement and dressing change   PT Next Visit Plan measure wounds   Consulted and Agree with Plan of Care Patient      Patient will benefit from skilled therapeutic intervention in order to improve the following deficits and impairments:  Pain, Difficulty walking, Decreased skin integrity  Visit Diagnosis: Pain in right lower leg  Difficulty in walking, not elsewhere classified  Puncture wound of right great toe with foreign body without damage to nail, subsequent encounter  Pain in left lower leg  Non-pressure chronic ulcer of left calf with necrosis of muscle ([place])      G-Codes - 2015-12-12 1136    Functional Limitation Other PT primary   Other PT Primary Current Status (A2633) At least 60 percent but less than 80 percent impaired, limited or restricted   Other PT Primary Goal Status (H5456) At least 20 percent but less than  40 percent impaired, limited or restricted    G8992 At least 60 percent but less than 80 percent impaired.   Problem List Patient Active Problem List   Diagnosis Date Noted  . Pain of left hand 11/08/2015  . Hand strain, left, initial encounter 11/08/2015  . Cellulitis 10/25/2015  . Cellulitis of right foot 10/24/2015  . Hypokalemia 10/24/2015  . Bradycardia 10/24/2015  . Cellulitis of foot, right 10/24/2015  . Wound infection   . Aortic atherosclerosis ([place]) 09/01/2015  . Sick sinus syndrome ([place]) 08/11/2015  . Non-sustained ventricular tachycardia ([place]) 08/11/2015  . Anticoagulated on Coumadin 08/11/2015  . CKD (chronic kidney disease) stage 3, GFR 30-59 ml/min 08/09/2015  . Elevated troponin 08/08/2015  . Renal insufficiency 08/08/2015  . Severe aortic stenosis 08/05/2015  . Hyperlipidemia 06/10/2015  . Tobacco use disorder 01/28/2014  . Osteoporosis with pathological fracture 08/13/2012  . DVT (deep venous thrombosis), right 05/13/2012  . HTN (hypertension) 05/13/2012  . Long term current use of anticoagulant therapy 05/13/2012  . Atrial fibrillation [facility]) 04/15/2012    Rayetta [name], PT CLT [phone] December 12, 2015, 11:38 AM  [address] [place], [place], [address] Phone: [phone]   Fax:  [phone]  Name: Candace [name] MRN: [id number] Date of Birth: 14-Apr-1922     PHYSICAL THERAPY DISCHARGE SUMMARY  Visits from Start of Care: 10  Current functional level related to goals / functional outcomes: See above   Remaining deficits: See above    Education / Equipment: N/A Plan: Patient agrees to discharge.  Patient goals were partially met. Patient is being discharged due to a change in medical status.  ?????        Pt daughter called. Family has decided to place pt into a nursing facility.    Rayetta [name], [place] CLT [phone]

## 2015-11-18 DIAGNOSIS — M5489 Other dorsalgia: Secondary | ICD-10-CM | POA: Diagnosis not present

## 2015-11-21 ENCOUNTER — Telehealth (HOSPITAL_COMMUNITY): Payer: Self-pay | Admitting: Nurse Practitioner

## 2015-11-21 NOTE — Telephone Encounter (Signed)
11/21/15 cx - daughter said to discahrge her they were going to have to put in nursing home

## 2015-11-22 ENCOUNTER — Ambulatory Visit (HOSPITAL_COMMUNITY): Payer: Medicare Other | Admitting: Physical Therapy

## 2015-11-23 ENCOUNTER — Ambulatory Visit (INDEPENDENT_AMBULATORY_CARE_PROVIDER_SITE_OTHER): Payer: Medicare Other | Admitting: Pharmacist

## 2015-11-23 DIAGNOSIS — I82401 Acute embolism and thrombosis of unspecified deep veins of right lower extremity: Secondary | ICD-10-CM | POA: Diagnosis not present

## 2015-11-23 DIAGNOSIS — I4891 Unspecified atrial fibrillation: Secondary | ICD-10-CM

## 2015-11-23 DIAGNOSIS — Z7901 Long term (current) use of anticoagulants: Secondary | ICD-10-CM | POA: Diagnosis not present

## 2015-11-23 LAB — COAGUCHEK XS/INR WAIVED
INR: 2.6 — ABNORMAL HIGH (ref 0.9–1.1)
Prothrombin Time: 30.8 s

## 2015-11-23 NOTE — Patient Instructions (Signed)
Anticoagulation Dose Instructions as of 11/23/2015      Candace [name] Tue Wed Thu Fri Sat   New Dose 1 mg 2 mg 2 mg 2 mg 2 mg 2 mg 2 mg    Description   Continue current warfarin dose of 1 tablet daily except 1/2 tablet on Sundays     Rehab / Physical therapy options:   [facility] - in patient / in house only [facility] [place] / [place]

## 2015-11-24 ENCOUNTER — Telehealth (HOSPITAL_COMMUNITY): Payer: Self-pay | Admitting: Nurse Practitioner

## 2015-11-24 ENCOUNTER — Ambulatory Visit (HOSPITAL_COMMUNITY): Payer: Self-pay | Admitting: Physical Therapy

## 2015-11-24 ENCOUNTER — Encounter: Payer: Self-pay | Admitting: Pediatrics

## 2015-11-24 ENCOUNTER — Ambulatory Visit (INDEPENDENT_AMBULATORY_CARE_PROVIDER_SITE_OTHER): Payer: Medicare Other | Admitting: Pediatrics

## 2015-11-24 VITALS — BP 156/98 | HR 58 | Temp 98.2°F | Ht <= 58 in | Wt 93.8 lb

## 2015-11-24 DIAGNOSIS — R296 Repeated falls: Secondary | ICD-10-CM

## 2015-11-24 DIAGNOSIS — I83022 Varicose veins of left lower extremity with ulcer of calf: Secondary | ICD-10-CM | POA: Diagnosis not present

## 2015-11-24 DIAGNOSIS — L97222 Non-pressure chronic ulcer of left calf with fat layer exposed: Secondary | ICD-10-CM | POA: Diagnosis not present

## 2015-11-24 MED ORDER — WHEELCHAIR MISC: 1.0000 | Freq: Once | 0 refills | Status: AC

## 2015-11-24 NOTE — Progress Notes (Signed)
  Subjective:   Patient ID: [name], female    DOB: 1922/03/09, 82 y.o.   MRN: [id number] CC: Follow-up (swelling bilateral legs)  HPI: Candace [name] is a 82 y.o. female presenting for Follow-up (swelling bilateral legs)  Was being followed at wound care for L posterior LE wound and R great toe ulcer Daughter is frustrated that pt continues to remove dressings every time they return home For much of day has someone in the house, not always in her room Has had several falls over the past few months Needs wheelchair to help with mobility on days when leg pain increases Continues to have b/l intermittent swelling in legs Daughter interested in ALF options No fevers Appetite normal  Relevant past medical, surgical, family and social history reviewed. Allergies and medications reviewed and updated. History  Smoking Status  . Never Smoker  Smokeless Tobacco  . Current User  . Types: Snuff   ROS: Per HPI   Objective:    BP (!) 156/98   Pulse (!) 58   Temp 98.2 F (36.8 C) (Oral)   Ht 4\' 3"  (1.295 m)   Wt 93 lb 12.8 oz (42.5 kg)   BMI 25.36 kg/m   Wt Readings from Last 3 Encounters:  11/24/15 93 lb 12.8 oz (42.5 kg)  11/08/15 95 lb (43.1 kg)  11/03/15 95 lb 6.4 oz (43.3 kg)    Gen: NAD, alert, cooperative with exam, NCAT EYES: EOMI, no conjunctival injection, or no icterus CV: NRRR, normal S1/S2 Resp: CTABL, no wheezes, normal WOB Ext: 1+ pitting edema b/l LE, warm Neuro: Alert and oriented Skin: L posterior calf with apprx 2-3 cm ulcer, minimal redness around area R great toe with 39mm ulcer below toe, R anterior calf with more superficial 2 cm ulcer, no tenderness skin around ulcers, ulcers themselves with feeling and tenderness  Assessment & Plan:  Wretha was seen today for follow-up multiple med problems.  Diagnoses and all orders for this visit:  Venous stasis ulcer of left calf with fat layer exposed, unspecified whether varicose veins present  (HCC) Needs HH dressing change next few days Referral placed Will refer to get back in to wound clinic given complexity of multiple wounds Una boot placed today b/l for LE wounds, needs changed early next week Should not remove wrappings at home Toe wrapped, needs f/u with wound -     AMB referral to rehabilitation -     Home Health -     Face-to-face encounter (required for Medicare/Medicaid patients) -     Misc. [facility]) MISC; 1 each by Does not apply route once.  Frequent falls mulitple falls at home, PT eval at home -     Home Health -     Face-to-face encounter (required for Medicare/Medicaid patients) -     Misc. Devices [facility]) MISC; 1 each by Does not apply route once.  Daugher to look into ALF options  Follow up plan: Within next 5 days with home health, wound or clinic for dressing change [name], MD [place]

## 2015-11-24 NOTE — Telephone Encounter (Signed)
11/24/15 Chanda at [facility] called to schedule more appts for patient.  I told her that the patient' s daughter called earlier in the week and cx all her appts because they were putting her in a nursing home.  Chanda spoke with the dr. And the dr. Geraldine Solar patient to be followed up here and they are working on having home health come to her but in the interim to continue seeing [place].  Wilfred Curtis will send a new referral.

## 2015-11-28 ENCOUNTER — Telehealth (HOSPITAL_COMMUNITY): Payer: Self-pay | Admitting: Nurse Practitioner

## 2015-11-28 NOTE — Telephone Encounter (Signed)
11/28/15 I spoke to daughter and told her that I had added her to the wait list and would call her ASAP

## 2015-11-29 ENCOUNTER — Other Ambulatory Visit: Payer: Medicare Other

## 2015-11-29 ENCOUNTER — Ambulatory Visit (HOSPITAL_COMMUNITY): Payer: Self-pay | Admitting: Physical Therapy

## 2015-11-29 ENCOUNTER — Ambulatory Visit (INDEPENDENT_AMBULATORY_CARE_PROVIDER_SITE_OTHER): Payer: Medicare Other | Admitting: Pediatrics

## 2015-11-29 VITALS — BP 144/70 | HR 80 | Temp 97.0°F | Ht <= 58 in | Wt 93.8 lb

## 2015-11-29 DIAGNOSIS — I10 Essential (primary) hypertension: Secondary | ICD-10-CM | POA: Diagnosis not present

## 2015-11-29 DIAGNOSIS — L03116 Cellulitis of left lower limb: Secondary | ICD-10-CM

## 2015-11-29 DIAGNOSIS — I4891 Unspecified atrial fibrillation: Secondary | ICD-10-CM | POA: Diagnosis not present

## 2015-11-29 MED ORDER — DOXYCYCLINE HYCLATE 100 MG PO TABS: 100.0000 mg | ORAL_TABLET | Freq: Two times a day (BID) | ORAL | 0 refills | Status: DC

## 2015-11-29 NOTE — Progress Notes (Signed)
  Subjective:   Patient ID: Candace [name], female    DOB: [birth year], 82 y.o.   MRN: [id number] CC: Follow-up  HPI: Candace [name] is a 82 y.o. female presenting for Follow-up  L leg more sore starting this morning Hurts over posterior L lower leg Wrapped in [place] boot 4 days ago R leg also wrapped for anterior shin more recent wound R toe dressed Pt has been able to tolerate dressings, has not removed them Has appt tomorrow with wound clinic No fevers, appetite baseline Feeling well  No further falls  Relevant past medical, surgical, family and social history reviewed. Allergies and medications reviewed and updated. History  Smoking Status  . Never Smoker  Smokeless Tobacco  . Current User  . Types: Snuff   ROS: Per HPI   Objective:    BP (!) 144/70   Pulse 80   Temp 97 F (36.1 C) (Oral)   Ht 4\' 3"  (1.295 m)   Wt 93 lb 12.8 oz (42.5 kg)   BMI 25.36 kg/m   Wt Readings from Last 3 Encounters:  11/29/15 93 lb 12.8 oz (42.5 kg)  11/24/15 93 lb 12.8 oz (42.5 kg)  11/08/15 95 lb (43.1 kg)    Gen: NAD, alert, cooperative with exam, NCAT EYES: EOMI, no conjunctival injection, or no icterus CV: NRRR, normal [id number], systolic murmur, cap refill <3 sec feet b/l Resp: CTABL, no wheezes, normal WOB Ext: 1+ pitting edema below knees b/l Neuro: Alert and oriented Skin: L leg venous stasis ulcer surrounded with 1cm erythema, TTP, no fluctuance, minimal amount of yellow discharge present over wound. R leg wrapped in una boot, R toe dressing intact.  Assessment & Plan:  [name] was seen today for follow-up wounds. Increased pain around L venous stasis ulcer. Will treat with PO antibiotics. On warfarin for a fib, hold for two days, then restart warfarin. Recheck INR in 1 week. L leg re-wrapped in [employer] Has appt tomorrow in wound clinic. Re-iterated with pt and family member very important to make it to that appointment.  Diagnoses and all orders for this visit:  Atrial  fibrillation, unspecified type ([place]) Warfarin as above, RTC 1 week for recheck  Essential hypertension Slightly elevated, will follow  Cellulitis of left lower extremity Follow up with wound clinic tomorrow Start below -     doxycycline (VIBRA-TABS) 100 MG tablet; Take 1 tablet (100 mg total) by mouth 2 (two) times daily.   Follow up plan: Return in about 1 week (around 12/06/2015) for anticoag check. [name], MD [place]

## 2015-11-29 NOTE — Patient Instructions (Signed)
Don't take warfarin tonight or tomorrow night (Tuesday night, Wednesday night) Restart warfarin Thursday night. Come back for INR recheck Monday.

## 2015-11-30 ENCOUNTER — Ambulatory Visit (HOSPITAL_COMMUNITY): Payer: Medicare Other | Admitting: Physical Therapy

## 2015-11-30 DIAGNOSIS — R262 Difficulty in walking, not elsewhere classified: Secondary | ICD-10-CM | POA: Diagnosis not present

## 2015-11-30 DIAGNOSIS — M79662 Pain in left lower leg: Secondary | ICD-10-CM | POA: Diagnosis not present

## 2015-11-30 DIAGNOSIS — L97223 Non-pressure chronic ulcer of left calf with necrosis of muscle: Secondary | ICD-10-CM | POA: Diagnosis not present

## 2015-11-30 DIAGNOSIS — M79661 Pain in right lower leg: Secondary | ICD-10-CM | POA: Diagnosis not present

## 2015-11-30 DIAGNOSIS — S91141D Puncture wound with foreign body of right great toe without damage to nail, subsequent encounter: Secondary | ICD-10-CM | POA: Diagnosis not present

## 2015-11-30 NOTE — Therapy (Signed)
[facility] [place], [place], [address] Phone: [phone]   Fax:  [phone]  Wound Care Therapy Reassessment  Patient Details  Name: Candace [name] MRN: [id number] Date of Birth: 1922-10-04 Referring Provider: Particia Nearing   Encounter Date: 11/30/2015      PT End of Session - 11/30/15 1455    Visit Number 11   Number of Visits 25   Date for PT Re-Evaluation 11/23/15   Authorization Type [employer] medicare: g code and recert completed on visit 11   Authorization - Visit Number 11   Authorization - Number of Visits 25   PT Start Time 1302   PT Stop Time 1350   PT Time Calculation (min) 48 min   Activity Tolerance Patient tolerated treatment well   Behavior During Therapy [facility] for tasks assessed/performed      Past Medical History:  Diagnosis Date  . Aortic stenosis   . Atrial fibrillation ([place])   . DVT (deep venous thrombosis) ([place])   . Essential hypertension   . Glaucoma   . History of skin cancer 2016  . Hypercholesterolemia   . PE (pulmonary embolism)     Past Surgical History:  Procedure Laterality Date  . CATARACT EXTRACTION    . CHOLECYSTECTOMY    . KYPHOPLASTY    . SKIN CANCER EXCISION Left 2016   neck    There were no vitals filed for this visit.         [facility] PT Assessment - 11/30/15 0001      Assessment   Medical Diagnosis Non healing wound   Referring Provider Particia Nearing    Onset Date/Surgical Date 09/23/15   Next MD Visit unknown   Prior Therapy self care, antibiotic      Precautions   Precautions None     Restrictions   Weight Bearing Restrictions No     Balance Screen   Has the patient fallen in the past 6 months Yes   How many times? 3 or 4   Has the patient had a decrease in activity level because of a fear of falling?  Yes   Is the patient reluctant to leave their home because of a fear of falling?  No     Prior Function   Level of Independence Independent      Cognition   Overall Cognitive Status Within Functional Limits for tasks assessed                 Wound Therapy - 11/30/15 1257    Subjective Pt daughter stated that they had stopped therapy due to the fact that they were going to place her mother in a nursing home.  This did not work out and due to the severity of her mother's wounds the physician wanted her to continue to come to skilled physical therapy    Patient and Family Stated Goals Wounds to heal    Date of Onset 09/23/15   Prior Treatments peroxide to Lt wound and antibiotic.    Pain Assessment 0-10   Pain Score 0-No pain  pain increases significantly with debridement; facial=7-8/10   Pain Intervention(s) Emotional support   Evaluation and Treatment Procedures Explained to Patient/Family Yes   Evaluation and Treatment Procedures agreed to   Wound Properties Date First Assessed: 10/24/15 Time First Assessed: 1749 Wound Type: Puncture Location: Toe (Comment  which one) Location Orientation: Right Wound Description (Comments): plantar aspect of great toe    Dressing Type Gauze (  Comment)  silver hydrofiber packed into wound   Dressing Changed Changed   Dressing Status Old drainage   Dressing Change Frequency PRN   Site / Wound Assessment Red;Yellow   % Wound base Red or Granulating 60%   % Wound base Yellow/Fibrinous Exudate 40%   Peri-wound Assessment Intact;Maceration   Wound Length (cm) 0.5 cm  was 1.5   Wound Width (cm) 0.6 cm  was 1.5   Wound Depth (cm) 0.3 cm  was 1.5   Undermining (cm) .6  medially; was underming 1.0 cm    Drainage Amount Moderate   Drainage Description Serous   Treatment Cleansed;Hydrotherapy (Pulse lavage)   Wound Properties Date First Assessed: 10/24/15 Time First Assessed: 1615 Wound Type: Other (Comment) Location: Leg Location Orientation: Left;Posterior;Lower Wound Description (Comments): dry Present on Admission: Yes   Dressing Type Gauze (Comment)  medihoney, kerlix and coban with  netting   Dressing Changed Changed   Dressing Status Old drainage   Site / Wound Assessment Pink;Yellow;Painful;Granulation tissue;Dusky   % Wound base Red or Granulating 35%   % Wound base Yellow/Fibrinous Exudate 65%   % Wound base Black/Eschar 0%   Peri-wound Assessment Erythema (blanchable)   Wound Length (cm) 2.5 cm  was 2.5   Wound Width (cm) 2.7 cm  was 3.0   Margins Unattached edges (unapproximated)   Closure None   Drainage Amount Minimal   Treatment Cleansed;Debridement (Selective)   Wound Properties Date First Assessed: 11/08/15 Time First Assessed: 1130 Wound Type: Laceration Location: Leg Location Orientation: Right;Anterior Wound Description (Comments): Rt anterior shin, hit on recliner lever at home Present on Admission: No   Dressing Type None   Dressing Changed Changed   Dressing Status Intact;Old drainage   Dressing Change Frequency PRN   Site / Wound Assessment Painful;Red;Yellow   % Wound base Red or Granulating 60%   % Wound base Yellow/Fibrinous Exudate 40%   % Wound base Other/Granulation Tissue (Comment) 0%  purplish, skin over may not be viable   Peri-wound Assessment Edema;Hemosiderin   Wound Length (cm) 2.1 cm   Wound Width (cm) 1.6 cm   Wound Depth (cm) 0 cm   Margins Unattached edges (unapproximated)   Drainage Amount Minimal   Drainage Description Serous   Treatment Cleansed;Hydrotherapy (Pulse lavage)   Pulsed lavage therapy - wound location to all wounds    Pulsed Lavage with Suction (psi) 4 psi   Pulsed Lavage with Suction - Normal Saline Used 1000 mL   Pulsed Lavage Tip Tip with splash shield   Selective Debridement - Location to Rt posterior wound .    Selective Debridement - Tools Used Forceps;Scalpel;Scissors   Selective Debridement - Tissue Removed slough    Wound Therapy - Clinical Statement Pt returns to therapy after approximately 2 weeks.  Pt was going to be placed in a nursing home but these plans did not work out.  Pt states that  the MD office  have been changing her dressings.  Pt comes to department with Unna boots on LE B.  Unna boots are to tight and causing segmental swelling.  Therapist had difficulty removing unna boots but once removed noted bruising along boney prominence.  Therapist cleansed and debrided wounds with pulse lavage and manual debridement.  Pt will benefit from skilled physical therapy to increase granulation to 100% to allow improved healing conditions as well as to decrease maceration where noted to prevent future skin break down.     Wound Therapy - Functional Problem List difficult walking  Factors Delaying/Impairing Wound Healing Altered sensation;Infection - systemic/local;Multiple medical problems;Polypharmacy;Vascular compromise   Hydrotherapy Plan Debridement;Dressing change;Patient/family education;Pulsatile lavage with suction   Wound Therapy - Frequency Other (comment)  2x a week for 6 weeks.    Wound Plan Pt to recieve pulse lavage, debridement and dressing change to all wounds on bilateral LE's.   Dressing  Rt great toe: silverhydrofiber packed into wound followed by 2x2 and 2" kling;   Dressing Lt LE  and Rt anterior wound medihoney followed by 4x4; 3"kling, cotton and coban.d , Loann Quill and #6 netting                 PT Education - 11/30/15 1455    Education provided Yes   Education Details Not to scratch legs    Person(s) Educated Patient   Methods Explanation   Comprehension Verbalized understanding          PT Short Term Goals - 11/30/15 1457      PT SHORT TERM GOAL #1   Title Pt Lt LE to have 50% granulation to decrease risk of infection   Time 8  time updated due to pt not coming for rx for the past 2 weeks and hospitalizations    Period Weeks   Status On-going     PT SHORT TERM GOAL #2   Title Pt and familty to be able to verbalize the signs and symptoms of infection and the importance of seeking medical help   Time 1   Period Weeks   Status  Achieved     PT SHORT TERM GOAL #3   Title Pt Rt toe wound to no longer have purulent drainage to demonstrate decreased infection    Time 3   Period Weeks   Status Achieved     PT SHORT TERM GOAL #4   Title Pt pain in both legs to be no greater than a 5/10 to allow pt to ambulate in comfort in her home   Baseline --   Time 3   Period Weeks   Status Achieved           PT Long Term Goals - 11/30/15 1502      PT LONG TERM GOAL #1   Title Lt LE wound to be healed    Time 12  time revised due to pt being in hospital and missing appointments then pt not being seen for 2 weeks due to daughter attempting to find facility for pt to live    Period Weeks   Status Not Met     PT LONG TERM GOAL #2   Title Rt great toe wound to have filled in depth to no greater than .3 and minimal undermining to allow family to be comfortable with dressing wound    Baseline 11/30/2015 depth is now .3 but wound continues to have significant undermining.    Time 12   Period Weeks   Status Partially Met               Plan - 11/30/15 1302    Clinical Impression Statement Pt daughter states that they are not placing her mother in a nursing home.  MD requests skilled care to be continues for multiple wounds.  Pt will benefit from skilled PT for drebridment and dressing to prevent infection.    Rehab Potential Good   PT Frequency 3x / week   PT Duration 6 weeks   PT Treatment/Interventions ADLs/Self Care Home Management;Patient/family education;Other (comment)  debridement and dressing change  PT Next Visit Plan Continue wound care twice a week for the next  6 weeks.    Consulted and Agree with Plan of Care Patient      Patient will benefit from skilled therapeutic intervention in order to improve the following deficits and impairments:  Pain, Difficulty walking, Decreased skin integrity  Visit Diagnosis: Difficulty in walking, not elsewhere classified  Puncture wound of right great toe  with foreign body without damage to nail, subsequent encounter  Non-pressure chronic ulcer of left calf with necrosis of muscle ([place])      G-Codes - 12-23-15 1505    Functional Assessment Tool Used clinical judgement;  granualtion and undermining    Functional Limitation Other PT primary   Other PT Primary Current Status (E0814) At least 60 percent but less than 80 percent impaired, limited or restricted   Other PT Primary Goal Status (G8185) At least 20 percent but less than 40 percent impaired, limited or restricted       Problem List Patient Active Problem List   Diagnosis Date Noted  . Pain of left hand 11/08/2015  . Hand strain, left, initial encounter 11/08/2015  . Cellulitis 10/25/2015  . Cellulitis of right foot 10/24/2015  . Hypokalemia 10/24/2015  . Bradycardia 10/24/2015  . Cellulitis of foot, right 10/24/2015  . Wound infection   . Aortic atherosclerosis ([place]) 09/01/2015  . Sick sinus syndrome ([place]) 08/11/2015  . Non-sustained ventricular tachycardia ([place]) 08/11/2015  . Anticoagulated on Coumadin 08/11/2015  . CKD (chronic kidney disease) stage 3, GFR 30-59 ml/min 08/09/2015  . Elevated troponin 08/08/2015  . Renal insufficiency 08/08/2015  . Severe aortic stenosis 08/05/2015  . Hyperlipidemia 06/10/2015  . Tobacco use disorder 01/28/2014  . Osteoporosis with pathological fracture 08/13/2012  . DVT (deep venous thrombosis), right 05/13/2012  . HTN (hypertension) 05/13/2012  . Long term current use of anticoagulant therapy 05/13/2012  . Atrial fibrillation [facility]) 04/15/2012    Rayetta [name], PT CLT [phone] 23-Dec-2015, 3:07 PM  [facility] [address] [place], [place], [address] Phone: [phone]   Fax:  [phone]  Name: Candace [name] MRN: [id number] Date of Birth: 11/22/1922

## 2015-12-05 ENCOUNTER — Encounter: Payer: Self-pay | Admitting: Family

## 2015-12-05 ENCOUNTER — Ambulatory Visit (INDEPENDENT_AMBULATORY_CARE_PROVIDER_SITE_OTHER): Payer: Medicare Other | Admitting: Family

## 2015-12-05 ENCOUNTER — Other Ambulatory Visit: Payer: Medicare Other

## 2015-12-05 VITALS — BP 158/76 | HR 54 | Temp 97.1°F | Ht <= 58 in | Wt 93.4 lb

## 2015-12-05 DIAGNOSIS — Z7901 Long term (current) use of anticoagulants: Secondary | ICD-10-CM

## 2015-12-05 DIAGNOSIS — I82401 Acute embolism and thrombosis of unspecified deep veins of right lower extremity: Secondary | ICD-10-CM

## 2015-12-05 DIAGNOSIS — I83029 Varicose veins of left lower extremity with ulcer of unspecified site: Secondary | ICD-10-CM | POA: Diagnosis not present

## 2015-12-05 DIAGNOSIS — I4891 Unspecified atrial fibrillation: Secondary | ICD-10-CM

## 2015-12-05 DIAGNOSIS — L97929 Non-pressure chronic ulcer of unspecified part of left lower leg with unspecified severity: Secondary | ICD-10-CM

## 2015-12-05 DIAGNOSIS — R609 Edema, unspecified: Secondary | ICD-10-CM

## 2015-12-05 LAB — COAGUCHEK XS/INR WAIVED
INR: 1.6 — ABNORMAL HIGH (ref 0.9–1.1)
Prothrombin Time: 19.2 s

## 2015-12-05 NOTE — Patient Instructions (Addendum)
Edema Edema is an abnormal buildup of fluids. It is more common in your legs and thighs. Painless swelling of the feet and ankles is more likely as a person ages. It also is common in looser skin, like around your eyes. Follow these instructions at home:  Keep the affected body part above the level of the heart while lying down.  Do not sit still or stand for a long time.  Do not put anything right under your knees when you lie down.  Do not wear tight clothes on your upper legs.  Exercise your legs to help the puffiness (swelling) go down.  Wear elastic bandages or support stockings as told by your doctor.  A low-salt diet may help lessen the puffiness.  Only take medicine as told by your doctor. Contact a doctor if:  Treatment is not working.  You have heart, liver, or kidney disease and notice that your skin looks puffy or shiny.  You have puffiness in your legs that does not get better when you raise your legs.  You have sudden weight gain for no reason. Get help right away if:  You have shortness of breath or chest pain.  You cannot breathe when you lie down.  You have pain, redness, or warmth in the areas that are puffy.  You have heart, liver, or kidney disease and get edema all of a sudden.  You have a fever and your symptoms get worse all of a sudden. This information is not intended to replace advice given to you by your health care provider. Make sure you discuss any questions you have with your health care provider. Document Released: 06/13/2007 Document Revised: 06/02/2015 Document Reviewed: 10/17/2012 Elsevier Interactive Patient Education  2017 [place].  Anticoagulation Dose Instructions as of 12/05/2015      Dorene Grebe Tue Wed Thu Fri Sat   New Dose 2 mg 2 mg 2 mg 2 mg 2 mg 2 mg 2 mg    Description   Continue current warfarin dose of 1 tablet daily. Take 1 1/2 tabs (3 mg) today then continue schedule.

## 2015-12-05 NOTE — Addendum Note (Signed)
Addended by: Earlene Plater on: 12/05/2015 12:41 PM   Modules accepted: Orders

## 2015-12-05 NOTE — Progress Notes (Addendum)
   Subjective:    Patient ID: CLYTEE MAJUMDAR, female    DOB: 1922/07/21, 82 y.o.   MRN: [id number]  HPI Pt presents to the office today with lower edema swelling. PT was seen in the clinic on 11/29/15 for cellulitis of left lower leg and sent to wound care. She is currently on doxycycline. Pt went last week to wound care and had unna boots placed bilaterally. PT states she wanted to take a shower and took the dressing off yesterday. Since removing the dressing she has had edema in bilateral lower legs. She has a wound care appt tomorrow.    Review of Systems  Cardiovascular: Positive for leg swelling.  All other systems reviewed and are negative.      Objective:   Physical Exam  Constitutional: She is oriented to person, place, and time. She appears well-developed and well-nourished.  Cardiovascular: Normal rate, regular rhythm and intact distal pulses.   Murmur heard. Pulmonary/Chest: Effort normal and breath sounds normal.  Musculoskeletal: She exhibits edema (3+ in BLE).   L leg venous stasis ulcer present with minimum erythemas  Neurological: She is alert and oriented to person, place, and time.  Psychiatric: She has a normal mood and affect. Her behavior is normal. Judgment and thought content normal.      BP (!) 158/76   Pulse (!) 54   Temp 97.1 F (36.2 C) (Oral)   Ht 4\' 3"  (1.295 m)   Wt 93 lb 6.4 oz (42.4 kg)   BMI 25.25 kg/m      Assessment & Plan:  1. Peripheral edema  2. Venous ulcer of left leg ([place])  3. Long term current use of anticoagulant therapy - CoaguChek XS/INR Waived  4. Atrial fibrillation, unspecified type ([place]) - CoaguChek XS/INR Waived  5. DVT (deep venous thrombosis), right Anticoagulation Dose Instructions as of 12/05/2015      Dorene Grebe Tue Wed Thu Fri Sat   New Dose 1 mg 2 mg 2 mg 2 mg 2 mg 2 mg 2 mg    Description   Continue current warfarin dose of 1 tablet daily except 1/2 tablet on Sundays     - CoaguChek XS/INR  Waived  Unna boots applied Keep elevated Keep wound care appt Continue doxycycline  RTO prn   Evelina Dun, FNP

## 2015-12-06 ENCOUNTER — Ambulatory Visit (HOSPITAL_COMMUNITY): Payer: Medicare Other | Admitting: Physical Therapy

## 2015-12-06 DIAGNOSIS — I83022 Varicose veins of left lower extremity with ulcer of calf: Secondary | ICD-10-CM | POA: Diagnosis not present

## 2015-12-06 DIAGNOSIS — R262 Difficulty in walking, not elsewhere classified: Secondary | ICD-10-CM | POA: Diagnosis not present

## 2015-12-06 DIAGNOSIS — L97223 Non-pressure chronic ulcer of left calf with necrosis of muscle: Secondary | ICD-10-CM | POA: Diagnosis not present

## 2015-12-06 DIAGNOSIS — L97222 Non-pressure chronic ulcer of left calf with fat layer exposed: Secondary | ICD-10-CM | POA: Diagnosis not present

## 2015-12-06 DIAGNOSIS — M79662 Pain in left lower leg: Secondary | ICD-10-CM | POA: Diagnosis not present

## 2015-12-06 DIAGNOSIS — S91141D Puncture wound with foreign body of right great toe without damage to nail, subsequent encounter: Secondary | ICD-10-CM

## 2015-12-06 DIAGNOSIS — M79661 Pain in right lower leg: Secondary | ICD-10-CM

## 2015-12-06 DIAGNOSIS — R296 Repeated falls: Secondary | ICD-10-CM | POA: Diagnosis not present

## 2015-12-06 NOTE — Therapy (Signed)
[place], [place], [address] Phone: [phone]   Fax:  [phone]  Wound Care Therapy  Patient Details  Name: Candace [name] MRN: [id number] Date of Birth: 09-18-1922 Referring Provider: Particia Nearing   Encounter Date: 12/06/2015      PT End of Session - 12/06/15 1114    Visit Number 12   Number of Visits 25   Date for PT Re-Evaluation 11/23/15   Authorization Type [employer] medicare: g code and recert completed on visit 11   Authorization - Visit Number 12   Authorization - Number of Visits 21   PT Start Time [phone]   PT Stop Time 1032   PT Time Calculation (min) 40 min   Activity Tolerance Patient tolerated treatment well   Behavior During Therapy [facility] for tasks assessed/performed      Past Medical History:  Diagnosis Date  . Aortic stenosis   . Atrial fibrillation ([place])   . DVT (deep venous thrombosis) ([place])   . Essential hypertension   . Glaucoma   . History of skin cancer 2016  . Hypercholesterolemia   . PE (pulmonary embolism)     Past Surgical History:  Procedure Laterality Date  . CATARACT EXTRACTION    . CHOLECYSTECTOMY    . KYPHOPLASTY    . SKIN CANCER EXCISION Left 2016   neck    There were no vitals filed for this visit.                  Wound Therapy - 12/06/15 1105    Subjective Pt went back to MD yesterday due to pain in her legs.  STates they put her on antibiotics    Patient and Family Stated Goals Wounds to heal    Date of Onset 09/23/15   Prior Treatments peroxide to Lt wound and antibiotic.    Pain Assessment Faces   Pain Score --   Faces Pain Scale Hurts a little bit   Pain Location Leg   Pain Orientation Left;Posterior;Right;Anterior   Pain Descriptors / Indicators Burning   Pain Intervention(s) Distraction   Evaluation and Treatment Procedures Explained to Patient/Family Yes   Evaluation and Treatment Procedures agreed to   Wound Properties Date First  Assessed: 10/24/15 Time First Assessed: 7494 Wound Type: Puncture Location: Toe (Comment  which one) Location Orientation: Right Wound Description (Comments): plantar aspect of great toe    Dressing Type Gauze (Comment)  silver hydrofiber packed into wound   Dressing Changed Changed   Dressing Status Old drainage   Dressing Change Frequency PRN   Site / Wound Assessment Red;Yellow   % Wound base Red or Granulating 70%   % Wound base Yellow/Fibrinous Exudate 30%   Peri-wound Assessment Intact;Maceration   Drainage Amount Moderate   Drainage Description Serous   Treatment Hydrotherapy (Pulse lavage);Debridement (Selective)   Wound Properties Date First Assessed: 10/24/15 Time First Assessed: 1615 Wound Type: Other (Comment) Location: Leg Location Orientation: Left;Posterior;Lower Wound Description (Comments): dry Present on Admission: Yes   Dressing Type Gauze (Comment)  medihoney, kerlix and coban with netting   Dressing Changed Changed   Dressing Status Old drainage   Site / Wound Assessment Pink;Yellow;Painful;Granulation tissue;Dusky   % Wound base Red or Granulating 35%   % Wound base Yellow/Fibrinous Exudate 65%   % Wound base Black/Eschar 0%   Peri-wound Assessment Erythema (blanchable)   Margins Unattached edges (unapproximated)   Closure None   Drainage Amount Minimal   Treatment Hydrotherapy (  Pulse lavage);Debridement (Selective)   Wound Properties Date First Assessed: 11/08/15 Time First Assessed: 1130 Wound Type: Laceration Location: Leg Location Orientation: Right;Anterior Wound Description (Comments): Rt anterior shin, hit on recliner lever at home Present on Admission: No   Dressing Type None   Dressing Changed Changed   Dressing Status Intact;Old drainage   Dressing Change Frequency PRN   Site / Wound Assessment Painful;Red;Yellow   % Wound base Red or Granulating 70%   % Wound base Yellow/Fibrinous Exudate 30%   % Wound base Other/Granulation Tissue (Comment) 0%   purplish, skin over may not be viable   Peri-wound Assessment Edema;Hemosiderin   Margins Unattached edges (unapproximated)   Drainage Amount Minimal   Drainage Description Serous   Treatment Hydrotherapy (Pulse lavage);Debridement (Selective)   Pulsed lavage therapy - wound location to all wounds    Pulsed Lavage with Suction (psi) 4 psi   Pulsed Lavage with Suction - Normal Saline Used 1000 mL   Pulsed Lavage Tip Tip with splash shield   Selective Debridement - Location to Rt anterior and Lt posterior wounds .    Selective Debridement - Tools Used Scalpel   Selective Debridement - Tissue Removed slough    Wound Therapy - Clinical Statement Unna boots on bilateral LE's to mid calf only applied at MD's office yesterday.  Noted edema above and patient reporting overall discomfort.  Removed these and cleansed and moisturized LE's thoroughly.  Pulse lavage used to irrigate wounds well and debridement required via sharps.  pt able to tolerate with distraction.  Used medihoney colloid on posterior Lt and anterior Rt wounds and packed silver hydrofiber into Rt great toe.  Used kerlix and coban.  Pt reported overall comfort with bandages at end of session.   Wound Therapy - Functional Problem List difficult walking    Factors Delaying/Impairing Wound Healing Altered sensation;Infection - systemic/local;Multiple medical problems;Polypharmacy;Vascular compromise   Hydrotherapy Plan Debridement;Dressing change;Patient/family education;Pulsatile lavage with suction   Wound Therapy - Frequency Other (comment)  2x a week for 6 weeks.    Wound Plan Pt to recieve pulse lavage, debridement and dressing change to all wounds on bilateral LE's.   Dressing  Rt great toe: silverhydrofiber packed into wound followed by 2x2 and 2" kling;   Dressing Lt LE  and Rt anterior wound medihoney colloid followed by 4x4; kerlix, cotton and coban.d , kerlix, coban and #6 netting                   PT Short Term  Goals - 11/30/15 1457      PT SHORT TERM GOAL #1   Title Pt Lt LE to have 50% granulation to decrease risk of infection   Time 8  time updated due to pt not coming for rx for the past 2 weeks and hospitalizations    Period Weeks   Status On-going     PT SHORT TERM GOAL #2   Title Pt and familty to be able to verbalize the signs and symptoms of infection and the importance of seeking medical help   Time 1   Period Weeks   Status Achieved     PT SHORT TERM GOAL #3   Title Pt Rt toe wound to no longer have purulent drainage to demonstrate decreased infection    Time 3   Period Weeks   Status Achieved     PT SHORT TERM GOAL #4   Title Pt pain in both legs to be no greater than a 5/10 to  allow pt to ambulate in comfort in her home   Baseline --   Time 3   Period Weeks   Status Achieved           PT Long Term Goals - 11/30/15 1502      PT LONG TERM GOAL #1   Title Lt LE wound to be healed    Time 12  time revised due to pt being in hospital and missing appointments then pt not being seen for 2 weeks due to daughter attempting to find facility for pt to live    Period Weeks   Status Not Met     PT LONG TERM GOAL #2   Title Rt great toe wound to have filled in depth to no greater than .3 and minimal undermining to allow family to be comfortable with dressing wound    Baseline 11/30/2015 depth is now .3 but wound continues to have significant undermining.    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 Pain of left hand 11/08/2015  . Hand strain, left, initial encounter 11/08/2015  . Cellulitis 10/25/2015  . Cellulitis of right foot 10/24/2015  . Hypokalemia 10/24/2015  . Bradycardia 10/24/2015  . Cellulitis of foot, right 10/24/2015  . Wound infection   . Aortic atherosclerosis ([place]) 09/01/2015  . Sick sinus syndrome ([place]) 08/11/2015  . Non-sustained ventricular tachycardia ([place]) 08/11/2015  . Anticoagulated on Coumadin 08/11/2015  . CKD (chronic kidney disease) stage 3, GFR 30-59 ml/min 08/09/2015  . Elevated troponin 08/08/2015  . Renal insufficiency 08/08/2015  . Severe aortic stenosis 08/05/2015  . Hyperlipidemia 06/10/2015  . Tobacco use disorder 01/28/2014  . Osteoporosis with pathological fracture 08/13/2012  . DVT (deep venous thrombosis), right 05/13/2012  . HTN (hypertension) 05/13/2012  . Long term current use of anticoagulant therapy 05/13/2012  . Atrial fibrillation ([place]) 04/15/2012    Teena Irani, PTA/CLT [phone]  12/06/2015, 11:17 AM  [facility] [address] [place], [place], [address] Phone: [phone]   Fax:  [phone]  Name: Candace [name] MRN: [id number] Date of Birth: [birth year]

## 2015-12-08 ENCOUNTER — Ambulatory Visit (HOSPITAL_COMMUNITY): Payer: Medicare Other | Admitting: Physical Therapy

## 2015-12-08 DIAGNOSIS — L97223 Non-pressure chronic ulcer of left calf with necrosis of muscle: Secondary | ICD-10-CM | POA: Diagnosis not present

## 2015-12-08 DIAGNOSIS — M79661 Pain in right lower leg: Secondary | ICD-10-CM

## 2015-12-08 DIAGNOSIS — S91141D Puncture wound with foreign body of right great toe without damage to nail, subsequent encounter: Secondary | ICD-10-CM

## 2015-12-08 DIAGNOSIS — M79662 Pain in left lower leg: Secondary | ICD-10-CM

## 2015-12-08 DIAGNOSIS — R262 Difficulty in walking, not elsewhere classified: Secondary | ICD-10-CM

## 2015-12-08 NOTE — Therapy (Signed)
[place], [place], [address] Phone: [phone]   Fax:  [phone]  Wound Care Therapy  Patient Details  Name: Candace [name] MRN: [id number] Date of Birth: 1922-04-28 Referring Provider: Particia Nearing   Encounter Date: 12/08/2015      PT End of Session - 12/08/15 1641    Visit Number 13   Number of Visits 25   Date for PT Re-Evaluation 11/23/15   Authorization Type [employer] medicare: g code and recert completed on visit 11   Authorization - Visit Number 13   Authorization - Number of Visits 21   PT Start Time 1520   PT Stop Time 1600   PT Time Calculation (min) 40 min   Activity Tolerance Patient tolerated treatment well   Behavior During Therapy [facility] for tasks assessed/performed      Past Medical History:  Diagnosis Date  . Aortic stenosis   . Atrial fibrillation ([place])   . DVT (deep venous thrombosis) ([place])   . Essential hypertension   . Glaucoma   . History of skin cancer 2016  . Hypercholesterolemia   . PE (pulmonary embolism)     Past Surgical History:  Procedure Laterality Date  . CATARACT EXTRACTION    . CHOLECYSTECTOMY    . KYPHOPLASTY    . SKIN CANCER EXCISION Left 2016   neck    There were no vitals filed for this visit.                  Wound Therapy - 12/08/15 1632    Subjective daugher with patient, states removed her Lt LE bandage   Patient and Family Stated Goals Wounds to heal    Date of Onset 09/23/15   Prior Treatments peroxide to Lt wound and antibiotic.    Pain Assessment No/denies pain   Evaluation and Treatment Procedures Explained to Patient/Family Yes   Evaluation and Treatment Procedures agreed to   Wound Properties Date First Assessed: 10/24/15 Time First Assessed: 9794 Wound Type: Puncture Location: Toe (Comment  which one) Location Orientation: Right Wound Description (Comments): plantar aspect of great toe    Dressing Type Silver hydrofiber  silver  hydrofiber packed into wound   Dressing Changed Changed   Dressing Status Old drainage   Dressing Change Frequency PRN   Site / Wound Assessment Red;Yellow   % Wound base Red or Granulating 70%   % Wound base Yellow/Fibrinous Exudate 30%   Peri-wound Assessment Intact;Maceration   Drainage Amount Moderate   Drainage Description Serous   Treatment Hydrotherapy (Pulse lavage);Debridement (Selective)   Wound Properties Date First Assessed: 10/24/15 Time First Assessed: 1615 Wound Type: Other (Comment) Location: Leg Location Orientation: Left;Posterior;Lower Wound Description (Comments): dry Present on Admission: Yes   Dressing Type Gauze (Comment)  medihoney with ace bandage over, had removed compression   Dressing Changed Changed   Dressing Status Old drainage   Site / Wound Assessment Pink;Yellow;Painful;Granulation tissue;Dusky   % Wound base Red or Granulating 50%   % Wound base Yellow/Fibrinous Exudate 50%   % Wound base Black/Eschar 0%   Peri-wound Assessment Erythema (blanchable)   Margins Unattached edges (unapproximated)   Closure None   Drainage Amount Minimal   Treatment Hydrotherapy (Pulse lavage);Debridement (Selective)   Wound Properties Date First Assessed: 11/08/15 Time First Assessed: 1130 Wound Type: Laceration Location: Leg Location Orientation: Right;Anterior Wound Description (Comments): Rt anterior shin, hit on recliner lever at home Present on Admission: No   Dressing Type Compression  wrap  medihoney colloid shin   Dressing Changed Changed   Dressing Status Intact;Old drainage   Dressing Change Frequency PRN   Site / Wound Assessment Painful;Red;Yellow   % Wound base Red or Granulating 80%   % Wound base Yellow/Fibrinous Exudate 20%   % Wound base Other/Granulation Tissue (Comment) 0%  purplish, skin over may not be viable   Peri-wound Assessment Intact   Margins Unattached edges (unapproximated)   Drainage Amount Minimal   Drainage Description Serous    Treatment Hydrotherapy (Pulse lavage);Debridement (Selective)   Pulsed lavage therapy - wound location to all wounds    Pulsed Lavage with Suction (psi) 4 psi   Pulsed Lavage with Suction - Normal Saline Used 1000 mL   Pulsed Lavage Tip Tip with splash shield   Selective Debridement - Location to Rt anterior and Lt posterior wounds .    Selective Debridement - Tools Used Scalpel   Selective Debridement - Tissue Removed slough    Wound Therapy - Clinical Statement Pt had removed compression layers off Lt LE but still had medihoney colloid sheet over wound with ACE bandage over.  Daugther reports pateint took it off.  Rt LE intact.  Overall wounds improved with increased granulation and approximation.  Rt great toe continues to drain purulent drainage, however no signs of infection.  continued with pulse lavage to all wounds with dressings and compression.   Wound Therapy - Functional Problem List difficult walking    Factors Delaying/Impairing Wound Healing Altered sensation;Infection - systemic/local;Multiple medical problems;Polypharmacy;Vascular compromise   Hydrotherapy Plan Debridement;Dressing change;Patient/family education;Pulsatile lavage with suction   Wound Therapy - Frequency Other (comment)  2x a week for 6 weeks.    Wound Plan Pt to recieve pulse lavage, debridement and dressing change to all wounds on bilateral LE's.   Dressing  Rt great toe: silverhydrofiber packed into wound followed by 2x2 and 2" kling;   Dressing Lt LE  and Rt anterior wound medihoney colloid followed by 4x4; kerlix, cotton and coban.d , kerlix, coban and #6 netting                   PT Short Term Goals - 11/30/15 1457      PT SHORT TERM GOAL #1   Title Pt Lt LE to have 50% granulation to decrease risk of infection   Time 8  time updated due to pt not coming for rx for the past 2 weeks and hospitalizations    Period Weeks   Status On-going     PT SHORT TERM GOAL #2   Title Pt and familty to  be able to verbalize the signs and symptoms of infection and the importance of seeking medical help   Time 1   Period Weeks   Status Achieved     PT SHORT TERM GOAL #3   Title Pt Rt toe wound to no longer have purulent drainage to demonstrate decreased infection    Time 3   Period Weeks   Status Achieved     PT SHORT TERM GOAL #4   Title Pt pain in both legs to be no greater than a 5/10 to allow pt to ambulate in comfort in her home   Baseline --   Time 3   Period Weeks   Status Achieved           PT Long Term Goals - 11/30/15 1502      PT LONG TERM GOAL #1   Title Lt LE wound to be  healed    Time 12  time revised due to pt being in hospital and missing appointments then pt not being seen for 2 weeks due to daughter attempting to find facility for pt to live    Period Weeks   Status Not Met     PT LONG TERM GOAL #2   Title Rt great toe wound to have filled in depth to no greater than .3 and minimal undermining to allow family to be comfortable with dressing wound    Baseline 11/30/2015 depth is now .3 but wound continues to have significant undermining.    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 Pain of left hand 11/08/2015  . Hand strain, left, initial encounter 11/08/2015  . Cellulitis 10/25/2015  . Cellulitis of right foot 10/24/2015  . Hypokalemia 10/24/2015  . Bradycardia 10/24/2015  . Cellulitis of foot, right 10/24/2015  . Wound infection   . Aortic  atherosclerosis ([place]) 09/01/2015  . Sick sinus syndrome ([place]) 08/11/2015  . Non-sustained ventricular tachycardia ([place]) 08/11/2015  . Anticoagulated on Coumadin 08/11/2015  . CKD (chronic kidney disease) stage 3, GFR 30-59 ml/min 08/09/2015  . Elevated troponin 08/08/2015  . Renal insufficiency 08/08/2015  . Severe aortic stenosis 08/05/2015  . Hyperlipidemia 06/10/2015  . Tobacco use disorder 01/28/2014  . Osteoporosis with pathological fracture 08/13/2012  . DVT (deep venous thrombosis), right 05/13/2012  . HTN (hypertension) 05/13/2012  . Long term current use of anticoagulant therapy 05/13/2012  . Atrial fibrillation ([place]) 04/15/2012    Teena Irani, PTA/CLT [phone]  12/08/2015, 4:42 PM  [facility] [address] [place], [place], [address] Phone: [phone]   Fax:  [phone]  Name: Candace [name] MRN: [id number] Date of Birth: 25-Sep-1922

## 2015-12-09 ENCOUNTER — Other Ambulatory Visit: Payer: Self-pay | Admitting: Nurse Practitioner

## 2015-12-09 DIAGNOSIS — I10 Essential (primary) hypertension: Secondary | ICD-10-CM

## 2015-12-12 ENCOUNTER — Ambulatory Visit (HOSPITAL_COMMUNITY): Payer: Medicare Other | Attending: Physician Assistant | Admitting: Physical Therapy

## 2015-12-12 DIAGNOSIS — S91141D Puncture wound with foreign body of right great toe without damage to nail, subsequent encounter: Secondary | ICD-10-CM | POA: Insufficient documentation

## 2015-12-12 DIAGNOSIS — L97223 Non-pressure chronic ulcer of left calf with necrosis of muscle: Secondary | ICD-10-CM | POA: Diagnosis not present

## 2015-12-12 DIAGNOSIS — M79662 Pain in left lower leg: Secondary | ICD-10-CM | POA: Diagnosis not present

## 2015-12-12 DIAGNOSIS — M79661 Pain in right lower leg: Secondary | ICD-10-CM | POA: Diagnosis not present

## 2015-12-12 DIAGNOSIS — R262 Difficulty in walking, not elsewhere classified: Secondary | ICD-10-CM | POA: Insufficient documentation

## 2015-12-12 NOTE — Therapy (Signed)
[place], [place], [address] Phone: [phone]   Fax:  [phone]  Wound Care Therapy  Patient Details  Name: Candace [name] MRN: [id number] Date of Birth: [birth year] Referring Provider: Particia Nearing   Encounter Date: 12/12/2015      PT End of Session - 12/12/15 1032    Visit Number 14   Number of Visits 25   Date for PT Re-Evaluation 11/23/15   Authorization Type [employer] medicare: g code and recert completed on visit 11   Authorization - Visit Number 14   Authorization - Number of Visits 21   PT Start Time 0908   PT Stop Time 0945   PT Time Calculation (min) 37 min   Activity Tolerance Patient tolerated treatment well   Behavior During Therapy [facility] for tasks assessed/performed      Past Medical History:  Diagnosis Date  . Aortic stenosis   . Atrial fibrillation ([place])   . DVT (deep venous thrombosis) ([place])   . Essential hypertension   . Glaucoma   . History of skin cancer 2016  . Hypercholesterolemia   . PE (pulmonary embolism)     Past Surgical History:  Procedure Laterality Date  . CATARACT EXTRACTION    . CHOLECYSTECTOMY    . KYPHOPLASTY    . SKIN CANCER EXCISION Left 2016   neck    There were no vitals filed for this visit.                  Wound Therapy - 12/12/15 1028    Subjective Pt states that her bandages are too tight.     Patient and Family Stated Goals Wounds to heal    Date of Onset 09/23/15   Prior Treatments peroxide to Lt wound and antibiotic.    Pain Assessment 0-10   Pain Score 3    Pain Location Leg   Pain Orientation Right   Pain Intervention(s) Emotional support   Evaluation and Treatment Procedures Explained to Patient/Family Yes   Evaluation and Treatment Procedures agreed to   Wound Properties Date First Assessed: 10/24/15 Time First Assessed: 4627 Wound Type: Puncture Location: Toe (Comment  which one) Location Orientation: Right Wound Description  (Comments): plantar aspect of great toe    Dressing Type Silver hydrofiber  silver hydrofiber packed into wound   Dressing Changed Changed   Dressing Status Old drainage   Dressing Change Frequency PRN   Site / Wound Assessment Red;Yellow   % Wound base Red or Granulating 85%   % Wound base Yellow/Fibrinous Exudate 15%   Peri-wound Assessment Intact;Maceration   Drainage Amount Moderate   Drainage Description Serous;Purulent;Green   Treatment Debridement (Selective);Hydrotherapy (Pulse lavage)   Wound Properties Date First Assessed: 10/24/15 Time First Assessed: 1615 Wound Type: Other (Comment) Location: Leg Location Orientation: Left;Posterior;Lower Wound Description (Comments): dry Present on Admission: Yes   Dressing Type Gauze (Comment)  medihoney with ace bandage over, had removed compression   Dressing Changed Changed   Dressing Status Old drainage   Site / Wound Assessment Pink;Yellow;Painful;Granulation tissue;Dusky   % Wound base Red or Granulating 60%   % Wound base Yellow/Fibrinous Exudate 40%   % Wound base Black/Eschar 0%   Peri-wound Assessment Erythema (blanchable)   Margins Unattached edges (unapproximated)   Closure None   Drainage Amount Minimal   Treatment Cleansed;Debridement (Selective);Hydrotherapy (Pulse lavage)   Wound Properties Date First Assessed: 11/08/15 Time First Assessed: 1130 Wound Type: Laceration Location: Leg Location Orientation:  Right;Anterior Wound Description (Comments): Rt anterior shin, hit on recliner lever at home Present on Admission: No   Dressing Type Gauze (Comment);Silver hydrofiber  medihoney colloid shin   Dressing Changed Changed   Dressing Status Intact;Old drainage   Dressing Change Frequency PRN   Site / Wound Assessment Painful;Red;Yellow   % Wound base Red or Granulating 80%   % Wound base Yellow/Fibrinous Exudate 20%   % Wound base Other/Granulation Tissue (Comment) 0%  purplish, skin over may not be viable   Peri-wound  Assessment Intact   Margins Unattached edges (unapproximated)   Drainage Amount Minimal   Drainage Description Serous   Treatment Cleansed;Debridement (Selective);Hydrotherapy (Pulse lavage)   Pulsed lavage therapy - wound location to all wounds    Pulsed Lavage with Suction (psi) 4 psi   Pulsed Lavage with Suction - Normal Saline Used 1000 mL   Pulsed Lavage Tip Tip with splash shield   Selective Debridement - Location to Rt anterior and Lt posterior wounds .    Selective Debridement - Tools Used Forceps;Scalpel   Selective Debridement - Tissue Removed slough    Wound Therapy - Clinical Statement Trial of dressing without coban due to pt having so much complaint and swelling being down.  Wounds continue to slowly granulate on LE but plantar wound of great toe appears to be increasing in undermining but this is difficult to tell as it might just be getting cleaner.     Wound Therapy - Functional Problem List difficult walking    Factors Delaying/Impairing Wound Healing Altered sensation;Infection - systemic/local;Multiple medical problems;Polypharmacy;Vascular compromise   Hydrotherapy Plan Debridement;Dressing change;Patient/family education;Pulsatile lavage with suction   Wound Therapy - Frequency Other (comment)  2x a week for 6 weeks.    Wound Plan Pt to recieve pulse lavage, debridement and dressing change to all wounds on bilateral LE's.   Dressing  Rt great toe: silverhydrofiber packed into wound followed by 2x2 and 2" kling;   Dressing Lt LE  and Rt anterior wound medihoney colloid followed by 4x4; kerlix, cotton and coban.d , kerlix, coban and #6 netting                   PT Short Term Goals - 11/30/15 1457      PT SHORT TERM GOAL #1   Title Pt Lt LE to have 50% granulation to decrease risk of infection   Time 8  time updated due to pt not coming for rx for the past 2 weeks and hospitalizations    Period Weeks   Status On-going     PT SHORT TERM GOAL #2   Title  Pt and familty to be able to verbalize the signs and symptoms of infection and the importance of seeking medical help   Time 1   Period Weeks   Status Achieved     PT SHORT TERM GOAL #3   Title Pt Rt toe wound to no longer have purulent drainage to demonstrate decreased infection    Time 3   Period Weeks   Status Achieved     PT SHORT TERM GOAL #4   Title Pt pain in both legs to be no greater than a 5/10 to allow pt to ambulate in comfort in her home   Baseline --   Time 3   Period Weeks   Status Achieved           PT Long Term Goals - 11/30/15 1502      PT LONG TERM GOAL #1  Title Lt LE wound to be healed    Time 12  time revised due to pt being in hospital and missing appointments then pt not being seen for 2 weeks due to daughter attempting to find facility for pt to live    Period Weeks   Status Not Met     PT LONG TERM GOAL #2   Title Rt great toe wound to have filled in depth to no greater than .3 and minimal undermining to allow family to be comfortable with dressing wound    Baseline 11/30/2015 depth is now .3 but wound continues to have significant undermining.    Time 12   Period Weeks   Status Partially Met               Plan - 12/12/15 1033    Clinical Impression Statement see above    Rehab Potential Good   PT Frequency 3x / week   PT Duration 6 weeks   PT Treatment/Interventions ADLs/Self Care Home Management;Patient/family education;Other (comment)  debridement and dressing change   PT Next Visit Plan Continue wound care twice a week for the next  6 weeks.    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 Pain of left hand 11/08/2015  . Hand strain, left, initial encounter 11/08/2015  . Cellulitis 10/25/2015  . Cellulitis of right foot 10/24/2015  . Hypokalemia 10/24/2015  . Bradycardia 10/24/2015  . Cellulitis of foot, right 10/24/2015  . Wound infection   . Aortic atherosclerosis ([place]) 09/01/2015  . Sick sinus syndrome ([place]) 08/11/2015  . Non-sustained ventricular tachycardia ([place]) 08/11/2015  . Anticoagulated on Coumadin 08/11/2015  . CKD (chronic kidney disease) stage 3, GFR 30-59 ml/min 08/09/2015  . Elevated troponin 08/08/2015  . Renal insufficiency 08/08/2015  . Severe aortic stenosis 08/05/2015  . Hyperlipidemia 06/10/2015  . Tobacco use disorder 01/28/2014  . Osteoporosis with pathological fracture 08/13/2012  . DVT (deep venous thrombosis), right 05/13/2012  . HTN (hypertension) 05/13/2012  . Long term current use of anticoagulant therapy 05/13/2012  . Atrial fibrillation [facility]) 04/15/2012   Rayetta [name], PT CLT [phone] 12/12/2015, 10:34 AM  [place] [address] [place], [place], [address] Phone: [phone]   Fax:  [phone]  Name: Candace [name] MRN: [id number] Date of Birth: 1922-12-01

## 2015-12-15 ENCOUNTER — Ambulatory Visit (HOSPITAL_COMMUNITY): Payer: Medicare Other | Admitting: Physical Therapy

## 2015-12-15 DIAGNOSIS — R262 Difficulty in walking, not elsewhere classified: Secondary | ICD-10-CM

## 2015-12-15 DIAGNOSIS — L97223 Non-pressure chronic ulcer of left calf with necrosis of muscle: Secondary | ICD-10-CM | POA: Diagnosis not present

## 2015-12-15 DIAGNOSIS — M79661 Pain in right lower leg: Secondary | ICD-10-CM

## 2015-12-15 DIAGNOSIS — M79662 Pain in left lower leg: Secondary | ICD-10-CM | POA: Diagnosis not present

## 2015-12-15 DIAGNOSIS — S91141D Puncture wound with foreign body of right great toe without damage to nail, subsequent encounter: Secondary | ICD-10-CM

## 2015-12-15 NOTE — Therapy (Signed)
[place], [place], [address] Phone: [phone]   Fax:  [phone]  Wound Care Therapy  Patient Details  Name: Candace [name] MRN: [id number] Date of Birth: [birth year] Referring Provider: Particia Nearing   Encounter Date: 12/15/2015      PT End of Session - 12/15/15 1408    Visit Number 15   Number of Visits 25   Date for PT Re-Evaluation 11/23/15   Authorization Type [employer] medicare: g code and recert completed on visit 11   Authorization - Visit Number 15   Authorization - Number of Visits 21   PT Start Time 5170   PT Stop Time 1345   PT Time Calculation (min) 40 min   Activity Tolerance Patient tolerated treatment well   Behavior During Therapy [facility] for tasks assessed/performed      Past Medical History:  Diagnosis Date  . Aortic stenosis   . Atrial fibrillation ([place])   . DVT (deep venous thrombosis) ([place])   . Essential hypertension   . Glaucoma   . History of skin cancer 2016  . Hypercholesterolemia   . PE (pulmonary embolism)     Past Surgical History:  Procedure Laterality Date  . CATARACT EXTRACTION    . CHOLECYSTECTOMY    . KYPHOPLASTY    . SKIN CANCER EXCISION Left 2016   neck    There were no vitals filed for this visit.                  Wound Therapy - 12/15/15 1359    Subjective Pt comes today with bandages mostly removed, CG reports she has been removing them.    Patient and Family Stated Goals Wounds to heal    Date of Onset 09/23/15   Prior Treatments peroxide to Lt wound and antibiotic.    Pain Assessment Faces   Pain Score 3    Evaluation and Treatment Procedures Explained to Patient/Family Yes   Evaluation and Treatment Procedures agreed to   Wound Properties Date First Assessed: 10/24/15 Time First Assessed: 0174 Wound Type: Puncture Location: Toe (Comment  which one) Location Orientation: Right Wound Description (Comments): plantar aspect of great toe    Dressing Type Silver hydrofiber  silver hydrofiber packed into wound   Dressing Changed Changed   Dressing Status Old drainage   Dressing Change Frequency PRN   Site / Wound Assessment Red;Yellow   % Wound base Red or Granulating 85%   % Wound base Yellow/Fibrinous Exudate 15%   Peri-wound Assessment Intact;Maceration   Drainage Amount Moderate   Drainage Description Serous;Purulent;Green   Treatment Hydrotherapy (Pulse lavage);Debridement (Selective)   Wound Properties Date First Assessed: 10/24/15 Time First Assessed: 1615 Wound Type: Other (Comment) Location: Leg Location Orientation: Left;Posterior;Lower Wound Description (Comments): dry Present on Admission: Yes   Dressing Type Gauze (Comment)  medihoney with ace bandage over, had removed compression   Dressing Changed Changed   Dressing Status Old drainage   Site / Wound Assessment Pink;Yellow;Painful;Granulation tissue;Dusky   % Wound base Red or Granulating 60%   % Wound base Yellow/Fibrinous Exudate 40%   % Wound base Black/Eschar 0%   Peri-wound Assessment Erythema (blanchable)   Margins Unattached edges (unapproximated)   Closure None   Drainage Amount Minimal   Treatment Hydrotherapy (Pulse lavage);Debridement (Selective)   Wound Properties Date First Assessed: 11/08/15 Time First Assessed: 1130 Wound Type: Laceration Location: Leg Location Orientation: Right;Anterior Wound Description (Comments): Rt anterior shin, hit on recliner lever at  home Present on Admission: No   Dressing Type Gauze (Comment);Silver hydrofiber  medihoney colloid shin   Dressing Changed Changed   Dressing Status Intact;Old drainage   Dressing Change Frequency PRN   Site / Wound Assessment Painful;Red;Yellow   % Wound base Red or Granulating 80%   % Wound base Yellow/Fibrinous Exudate 20%   % Wound base Other/Granulation Tissue (Comment) 0%  purplish, skin over may not be viable   Peri-wound Assessment Intact   Margins Unattached edges  (unapproximated)   Drainage Amount Minimal   Drainage Description Serous   Treatment Hydrotherapy (Pulse lavage);Debridement (Selective)   Pulsed lavage therapy - wound location to all wounds    Pulsed Lavage with Suction (psi) 4 psi   Pulsed Lavage with Suction - Normal Saline Used 1000 mL   Pulsed Lavage Tip Tip with splash shield   Selective Debridement - Location to Rt anterior and Lt posterior wounds .    Selective Debridement - Tools Used Forceps;Scalpel   Selective Debridement - Tissue Removed slough    Wound Therapy - Clinical Statement Pt still tampered with dressings removing most of dressing on bilateral LE's.  No increase in swelling noted so proceeded with kerlix and netting only this session.  Changed dressing on Rt anterior LE to medihoney colloid due to adherent slough present and unable to debride due to pt's hypersensitivity/pain.  Lt LE wound with improvement noted with slight approximation of borders.  Rt great toe is larger but with less undermining and appears improving despite purulent drainage.     Wound Therapy - Functional Problem List difficult walking    Factors Delaying/Impairing Wound Healing Altered sensation;Infection - systemic/local;Multiple medical problems;Polypharmacy;Vascular compromise   Hydrotherapy Plan Debridement;Dressing change;Patient/family education;Pulsatile lavage with suction   Wound Therapy - Frequency Other (comment)  2x a week for 6 weeks.    Wound Plan Pt to recieve pulse lavage, debridement and dressing change to all wounds on bilateral LE's.   Dressing  Rt great toe: silverhydrofiber packed into wound followed by 2x2 and 2" kling;   Dressing Rt LE:  medihoney colloid followed by 4x4; kerlix, and #6 netting   Dressing Lt LE: silver hydrofiber to posterior LE, kerlix and #6 netting                   PT Short Term Goals - 11/30/15 1457      PT SHORT TERM GOAL #1   Title Pt Lt LE to have 50% granulation to decrease risk of  infection   Time 8  time updated due to pt not coming for rx for the past 2 weeks and hospitalizations    Period Weeks   Status On-going     PT SHORT TERM GOAL #2   Title Pt and familty to be able to verbalize the signs and symptoms of infection and the importance of seeking medical help   Time 1   Period Weeks   Status Achieved     PT SHORT TERM GOAL #3   Title Pt Rt toe wound to no longer have purulent drainage to demonstrate decreased infection    Time 3   Period Weeks   Status Achieved     PT SHORT TERM GOAL #4   Title Pt pain in both legs to be no greater than a 5/10 to allow pt to ambulate in comfort in her home   Baseline --   Time 3   Period Weeks   Status Achieved  PT Long Term Goals - 11/30/15 1502      PT LONG TERM GOAL #1   Title Lt LE wound to be healed    Time 12  time revised due to pt being in hospital and missing appointments then pt not being seen for 2 weeks due to daughter attempting to find facility for pt to live    Period Weeks   Status Not Met     PT LONG TERM GOAL #2   Title Rt great toe wound to have filled in depth to no greater than .3 and minimal undermining to allow family to be comfortable with dressing wound    Baseline 11/30/2015 depth is now .3 but wound continues to have significant undermining.    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 Pain of left hand 11/08/2015  . Hand strain, left, initial encounter  11/08/2015  . Cellulitis 10/25/2015  . Cellulitis of right foot 10/24/2015  . Hypokalemia 10/24/2015  . Bradycardia 10/24/2015  . Cellulitis of foot, right 10/24/2015  . Wound infection   . Aortic atherosclerosis ([place]) 09/01/2015  . Sick sinus syndrome ([place]) 08/11/2015  . Non-sustained ventricular tachycardia ([place]) 08/11/2015  . Anticoagulated on Coumadin 08/11/2015  . CKD (chronic kidney disease) stage 3, GFR 30-59 ml/min 08/09/2015  . Elevated troponin 08/08/2015  . Renal insufficiency 08/08/2015  . Severe aortic stenosis 08/05/2015  . Hyperlipidemia 06/10/2015  . Tobacco use disorder 01/28/2014  . Osteoporosis with pathological fracture 08/13/2012  . DVT (deep venous thrombosis), right 05/13/2012  . HTN (hypertension) 05/13/2012  . Long term current use of anticoagulant therapy 05/13/2012  . Atrial fibrillation ([place]) 04/15/2012    Teena Irani, PTA/CLT [phone]  12/15/2015, 2:09 PM  [place] [address] [place], [place], [address] Phone: [phone]   Fax:  [phone]  Name: ELLENORA TALTON MRN: [id number] Date of Birth: [birth year]

## 2015-12-18 DIAGNOSIS — M810 Age-related osteoporosis without current pathological fracture: Secondary | ICD-10-CM | POA: Diagnosis not present

## 2015-12-18 DIAGNOSIS — M545 Low back pain: Secondary | ICD-10-CM | POA: Diagnosis not present

## 2015-12-19 ENCOUNTER — Ambulatory Visit (HOSPITAL_COMMUNITY): Payer: Medicare Other | Admitting: Physical Therapy

## 2015-12-19 ENCOUNTER — Telehealth (HOSPITAL_COMMUNITY): Payer: Self-pay | Admitting: Nurse Practitioner

## 2015-12-19 DIAGNOSIS — M545 Low back pain: Secondary | ICD-10-CM | POA: Diagnosis not present

## 2015-12-19 DIAGNOSIS — M549 Dorsalgia, unspecified: Secondary | ICD-10-CM | POA: Diagnosis not present

## 2015-12-19 DIAGNOSIS — G8929 Other chronic pain: Secondary | ICD-10-CM | POA: Diagnosis not present

## 2015-12-19 DIAGNOSIS — S22080A Wedge compression fracture of T11-T12 vertebra, initial encounter for closed fracture: Secondary | ICD-10-CM | POA: Diagnosis not present

## 2015-12-19 NOTE — Telephone Encounter (Signed)
12/19/15  Caller just said that she needed to cx the appt... Didn't give a reason

## 2015-12-22 ENCOUNTER — Ambulatory Visit (HOSPITAL_COMMUNITY): Payer: Medicare Other | Admitting: Physical Therapy

## 2015-12-22 DIAGNOSIS — L97223 Non-pressure chronic ulcer of left calf with necrosis of muscle: Secondary | ICD-10-CM | POA: Diagnosis not present

## 2015-12-22 DIAGNOSIS — M79661 Pain in right lower leg: Secondary | ICD-10-CM

## 2015-12-22 DIAGNOSIS — S91141D Puncture wound with foreign body of right great toe without damage to nail, subsequent encounter: Secondary | ICD-10-CM

## 2015-12-22 DIAGNOSIS — M79662 Pain in left lower leg: Secondary | ICD-10-CM

## 2015-12-22 DIAGNOSIS — M549 Dorsalgia, unspecified: Secondary | ICD-10-CM | POA: Diagnosis not present

## 2015-12-22 DIAGNOSIS — R262 Difficulty in walking, not elsewhere classified: Secondary | ICD-10-CM

## 2015-12-22 NOTE — Therapy (Signed)
[place], [place], [address] Phone: [phone]   Fax:  [phone]  Physical Therapy Treatment  Patient Details  Name: Candace [name] MRN: [id number] Date of Birth: 28-Apr-1922 Referring Provider: Particia Nearing   Encounter Date: 12/22/2015      PT End of Session - 12/22/15 1619    Visit Number 16   Number of Visits 25   Date for PT Re-Evaluation 11/23/15   Authorization Type [employer] medicare: g code and recert completed on visit 11   Authorization - Visit Number 16   Authorization - Number of Visits 21   PT Start Time 5638   PT Stop Time 1520   PT Time Calculation (min) 45 min   Activity Tolerance Patient tolerated treatment well   Behavior During Therapy [facility] for tasks assessed/performed      Past Medical History:  Diagnosis Date  . Aortic stenosis   . Atrial fibrillation ([place])   . DVT (deep venous thrombosis) ([place])   . Essential hypertension   . Glaucoma   . History of skin cancer 2016  . Hypercholesterolemia   . PE (pulmonary embolism)     Past Surgical History:  Procedure Laterality Date  . CATARACT EXTRACTION    . CHOLECYSTECTOMY    . KYPHOPLASTY    . SKIN CANCER EXCISION Left 2016   neck    There were no vitals filed for this visit.                     Wound Therapy - 12/22/15 1610    Subjective Pt cancelled last appointment and has not been to therapy in a week.  States that her back is hurting her more than her leg; her  right leg has pain but the left leg is painfree.     Patient and Family Stated Goals Wounds to heal    Date of Onset 09/23/15   Prior Treatments peroxide to Lt wound and antibiotic.    Pain Assessment Faces   Pain Score 7   with debridement no apparent pain at rest.    Evaluation and Treatment Procedures Explained to Patient/Family Yes   Evaluation and Treatment Procedures agreed to   Wound Properties Date First Assessed: 10/24/15 Time First Assessed:  7564 Wound Type: Puncture Location: Toe (Comment  which one) Location Orientation: Right Wound Description (Comments): plantar aspect of great toe    Dressing Type Silver hydrofiber  silver hydrofiber packed into wound   Dressing Changed Changed   Dressing Status Old drainage   Dressing Change Frequency PRN   Site / Wound Assessment Red;Yellow   % Wound base Red or Granulating 90%   % Wound base Yellow/Fibrinous Exudate 10%   Peri-wound Assessment Intact;Maceration   Wound Length (cm) 0.6 cm  was .5 with significant undermining   Wound Width (cm) 1.2 cm  was .6 with significant undermining    Wound Depth (cm) 0.3 cm   Undermining (cm) .5   undermining anterior, posterior and medially   Drainage Amount Moderate   Drainage Description Serous;Purulent;Green   Treatment Cleansed;Debridement (Selective);Hydrotherapy (Pulse lavage)   Wound Properties Date First Assessed: 10/24/15 Time First Assessed: 1615 Wound Type: Other (Comment) Location: Leg Location Orientation: Left;Posterior;Lower Wound Description (Comments): dry Present on Admission: Yes   Dressing Type Gauze (Comment)  medihoney with    Dressing Changed Changed   Dressing Status Old drainage   Site / Wound Assessment Pink;Yellow;Painful;Granulation tissue;Dusky   % Wound base  Red or Granulating 70%   % Wound base Yellow/Fibrinous Exudate 30%   % Wound base Black/Eschar 0%   Peri-wound Assessment Edema;Erythema (blanchable)   Margins Unattached edges (unapproximated)   Closure None   Drainage Amount Minimal   Treatment Cleansed;Hydrotherapy (Pulse lavage)   Wound Properties Date First Assessed: 11/08/15 Time First Assessed: 1130 Wound Type: Laceration Location: Leg Location Orientation: Right;Anterior Wound Description (Comments): Rt anterior shin, hit on recliner lever at home Present on Admission: No   Dressing Type Compression wrap;Gauze (Comment)  medihoney colloid shin   Dressing Changed Changed   Dressing Status  Intact;Old drainage   Dressing Change Frequency PRN   Site / Wound Assessment Painful;Red;Yellow   % Wound base Red or Granulating 50%   % Wound base Yellow/Fibrinous Exudate 50%   % Wound base Other/Granulation Tissue (Comment) 0%  purplish, skin over may not be viable   Peri-wound Assessment Intact;Edema   Wound Length (cm) 2.1 cm  was 2.1   Wound Width (cm) 2 cm  was 1.6   Margins Unattached edges (unapproximated)   Drainage Amount Minimal   Drainage Description Serous   Treatment Cleansed;Debridement (Selective);Hydrotherapy (Pulse lavage)   Pulsed lavage therapy - wound location to all wounds    Pulsed Lavage with Suction (psi) 4 psi   Pulsed Lavage with Suction - Normal Saline Used 1000 mL   Pulsed Lavage Tip Tip with splash shield   Selective Debridement - Location to Rt anterior and Lt posterior wounds .    Selective Debridement - Tools Used Forceps;Scalpel   Selective Debridement - Tissue Removed slough    Wound Therapy - Clinical Statement Pt has noted increased edema since not placing compression.  Therapist added coban and will assess to see if this is enough to keep the swelling down.     Wound Therapy - Functional Problem List difficult walking    Factors Delaying/Impairing Wound Healing Altered sensation;Infection - systemic/local;Multiple medical problems;Polypharmacy;Vascular compromise   Hydrotherapy Plan Debridement;Dressing change;Patient/family education;Pulsatile lavage with suction   Wound Therapy - Frequency Other (comment)  2x a week for 6 weeks.    Wound Plan Pt to recieve pulse lavage, debridement and dressing change to all wounds on bilateral LE's.   Dressing  Rt great toe: silverhydrofiber packed into wound followed by 2x2 and 2" kling;   Dressing Rt LE: anterior and Lt posterior  medihoney colloid followed by 4x4; kerlix, coban and #6 netting                    PT Short Term Goals - 11/30/15 1457      PT SHORT TERM GOAL #1   Title Pt Lt  LE to have 50% granulation to decrease risk of infection   Time 8  time updated due to pt not coming for rx for the past 2 weeks and hospitalizations    Period Weeks   Status On-going     PT SHORT TERM GOAL #2   Title Pt and familty to be able to verbalize the signs and symptoms of infection and the importance of seeking medical help   Time 1   Period Weeks   Status Achieved     PT SHORT TERM GOAL #3   Title Pt Rt toe wound to no longer have purulent drainage to demonstrate decreased infection    Time 3   Period Weeks   Status Achieved     PT SHORT TERM GOAL #4   Title Pt pain in both legs to  be no greater than a 5/10 to allow pt to ambulate in comfort in her home   Baseline --   Time 3   Period Weeks   Status Achieved           PT Long Term Goals - 11/30/15 1502      PT LONG TERM GOAL #1   Title Lt LE wound to be healed    Time 12  time revised due to pt being in hospital and missing appointments then pt not being seen for 2 weeks due to daughter attempting to find facility for pt to live    Period Weeks   Status Not Met     PT LONG TERM GOAL #2   Title Rt great toe wound to have filled in depth to no greater than .3 and minimal undermining to allow family to be comfortable with dressing wound    Baseline 11/30/2015 depth is now .3 but wound continues to have significant undermining.    Time 12   Period Weeks   Status Partially Met               Plan - 12/22/15 1620    Clinical Impression Statement as above    Rehab Potential Good   PT Frequency 3x / week   PT Duration 6 weeks   PT Treatment/Interventions ADLs/Self Care Home Management;Patient/family education;Other (comment)  debridement and dressing change   PT Next Visit Plan Continue wound care twice a week for the next  6 weeks.    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 Pain of left hand 11/08/2015  . Hand strain, left, initial encounter 11/08/2015  . Cellulitis 10/25/2015  . Cellulitis of right foot 10/24/2015  . Hypokalemia 10/24/2015  . Bradycardia 10/24/2015  . Cellulitis of foot, right 10/24/2015  . Wound infection   . Aortic atherosclerosis ([employer]) 09/01/2015  . Sick sinus syndrome ([place]) 08/11/2015  . Non-sustained ventricular tachycardia ([place]) 08/11/2015  . Anticoagulated on Coumadin 08/11/2015  . CKD (chronic kidney disease) stage 3, GFR 30-59 ml/min 08/09/2015  . Elevated troponin 08/08/2015  . Renal insufficiency 08/08/2015  . Severe aortic stenosis 08/05/2015  . Hyperlipidemia 06/10/2015  . Tobacco use disorder 01/28/2014  . Osteoporosis with pathological fracture 08/13/2012  . DVT (deep venous thrombosis), right 05/13/2012  . HTN (hypertension) 05/13/2012  . Long term current use of anticoagulant therapy 05/13/2012  . Atrial fibrillation [facility]) 04/15/2012    Rayetta [name], PT CLT [phone] 12/22/2015, 4:21 PM  [facility] [address] [place], [place], [address] Phone: [phone]   Fax:  [phone]  Name: Candace [name] MRN: [id number] Date of Birth: January 22, 1922

## 2015-12-23 ENCOUNTER — Other Ambulatory Visit: Payer: Self-pay | Admitting: Nurse Practitioner

## 2015-12-23 DIAGNOSIS — I10 Essential (primary) hypertension: Secondary | ICD-10-CM

## 2015-12-24 ENCOUNTER — Encounter (HOSPITAL_COMMUNITY): Payer: Self-pay | Admitting: Emergency Medicine

## 2015-12-24 ENCOUNTER — Emergency Department (HOSPITAL_COMMUNITY): Payer: Medicare Other

## 2015-12-24 ENCOUNTER — Emergency Department (HOSPITAL_COMMUNITY)
Admission: EM | Admit: 2015-12-24 | Discharge: 2015-12-24 | Disposition: A | Discharge status: Home / Self Care | Payer: Medicare Other | Attending: Emergency Medicine | Admitting: Emergency Medicine

## 2015-12-24 DIAGNOSIS — M546 Pain in thoracic spine: Secondary | ICD-10-CM

## 2015-12-24 DIAGNOSIS — N183 Chronic kidney disease, stage 3 (moderate): Secondary | ICD-10-CM | POA: Insufficient documentation

## 2015-12-24 DIAGNOSIS — I129 Hypertensive chronic kidney disease with stage 1 through stage 4 chronic kidney disease, or unspecified chronic kidney disease: Secondary | ICD-10-CM | POA: Diagnosis not present

## 2015-12-24 DIAGNOSIS — Z85820 Personal history of malignant melanoma of skin: Secondary | ICD-10-CM | POA: Insufficient documentation

## 2015-12-24 DIAGNOSIS — Z79899 Other long term (current) drug therapy: Secondary | ICD-10-CM | POA: Diagnosis not present

## 2015-12-24 DIAGNOSIS — Z7901 Long term (current) use of anticoagulants: Secondary | ICD-10-CM | POA: Diagnosis not present

## 2015-12-24 DIAGNOSIS — F17290 Nicotine dependence, other tobacco product, uncomplicated: Secondary | ICD-10-CM | POA: Diagnosis not present

## 2015-12-24 DIAGNOSIS — S22000A Wedge compression fracture of unspecified thoracic vertebra, initial encounter for closed fracture: Secondary | ICD-10-CM | POA: Diagnosis not present

## 2015-12-24 MED ORDER — HYDROCODONE-ACETAMINOPHEN 5-325 MG PO TABS: 2.0000 | ORAL_TABLET | ORAL | 0 refills | Status: DC | PRN

## 2015-12-24 MED ORDER — ONDANSETRON 4 MG PO TBDP
4.0000 mg | ORAL_TABLET | Freq: Once | ORAL | Status: AC
  Administered 2015-12-24: 4 mg via ORAL
  Filled 2015-12-24: qty 1

## 2015-12-24 MED ORDER — OXYCODONE-ACETAMINOPHEN 5-325 MG PO TABS
1.0000 | ORAL_TABLET | Freq: Once | ORAL | Status: AC
  Administered 2015-12-24: 1 via ORAL
  Filled 2015-12-24: qty 1

## 2015-12-24 MED ORDER — OXYCODONE-ACETAMINOPHEN 5-325 MG PO TABS: 2.0000 | ORAL_TABLET | Freq: Once | ORAL | Status: DC

## 2015-12-24 NOTE — ED Notes (Signed)
Patient given peanut crackers per PA approval. Patient states she needs pain medicine. PA notified.

## 2015-12-24 NOTE — ED Triage Notes (Signed)
Patient c/o mid-low back pain. Denies any injury or urinary symptoms. Per patient taking a pain pill given to her by Dr Tawanna Sat office but unsure of name. Per patient no improvement in pain. Patient also states "I think I need something for my nerves too."

## 2015-12-24 NOTE — ED Notes (Signed)
Patient given water at this time.  

## 2015-12-24 NOTE — ED Provider Notes (Signed)
[facility] DEPT Provider Note   CSN: [id number] Arrival date & time: 12/24/15  [id number]     History   Chief Complaint Chief Complaint  Patient presents with  . Back Pain    HPI Candace [name] is a 82 y.o. female.  The history is provided by the patient. No language interpreter was used.  Back Pain   This is a recurrent problem. Episode onset: chronic increased pain today. The problem occurs constantly. The problem has not changed since onset.The pain is present in the thoracic spine. The pain is moderate. The pain is worse during the day. Pertinent negatives include no weakness. She has tried nothing for the symptoms. The treatment provided moderate relief.   Pt has a histroy of compression fractures.  Pt reports increased pain.   Past Medical History:  Diagnosis Date  . Aortic stenosis   . Atrial fibrillation ([place])   . DVT (deep venous thrombosis) ([place])   . Essential hypertension   . Glaucoma   . History of skin cancer 2016  . Hypercholesterolemia   . PE (pulmonary embolism)     Patient Active Problem List   Diagnosis Date Noted  . Pain of left hand 11/08/2015  . Hand strain, left, initial encounter 11/08/2015  . Cellulitis 10/25/2015  . Cellulitis of right foot 10/24/2015  . Hypokalemia 10/24/2015  . Bradycardia 10/24/2015  . Cellulitis of foot, right 10/24/2015  . Wound infection   . Aortic atherosclerosis ([place]) 09/01/2015  . Sick sinus syndrome ([place]) 08/11/2015  . Non-sustained ventricular tachycardia ([place]) 08/11/2015  . Anticoagulated on Coumadin 08/11/2015  . CKD (chronic kidney disease) stage 3, GFR 30-59 ml/min 08/09/2015  . Elevated troponin 08/08/2015  . Renal insufficiency 08/08/2015  . Severe aortic stenosis 08/05/2015  . Hyperlipidemia 06/10/2015  . Tobacco use disorder 01/28/2014  . Osteoporosis with pathological fracture 08/13/2012  . DVT (deep venous thrombosis), right 05/13/2012  . HTN (hypertension) 05/13/2012  . Long term current use of  anticoagulant therapy 05/13/2012  . Atrial fibrillation ([place]) 04/15/2012    Past Surgical History:  Procedure Laterality Date  . CATARACT EXTRACTION    . CHOLECYSTECTOMY    . KYPHOPLASTY    . SKIN CANCER EXCISION Left 2016   neck    OB History    Gravida Para Term Preterm AB Living             4   SAB TAB Ectopic Multiple Live Births                   Home Medications    Prior to Admission medications   Medication Sig Start Date End Date Taking? Authorizing Provider  acetaminophen (TYLENOL) 500 MG tablet Take 1 tablet (500 mg total) by mouth every 8 (eight) hours as needed for mild pain. 08/14/15  Yes Samuella Cota, MD  amLODipine (NORVASC) 2.5 MG tablet Take 1 tablet (2.5 mg total) by mouth daily. 09/28/15  Yes Minus Breeding, MD  brimonidine (ALPHAGAN P) 0.1 % SOLN Apply 1 drop to eye 2 (two) times daily.    Yes Historical Provider, MD  doxycycline (VIBRA-TABS) 100 MG tablet Take 1 tablet (100 mg total) by mouth 2 (two) times daily. 11/29/15  Yes Eustaquio Maize, MD  furosemide (LASIX) 20 MG tablet TAKE ONE TABLET BY MOUTH ONCE DAILY 12/12/15  Yes [name] Done, FNP  LUMIGAN 0.01 % SOLN INSTILL ONE DROP INTO [facility] EYE AT BEDTIME 10/04/15  Yes [name] Done, FNP  simvastatin (ZOCOR) 40 MG tablet Take  1 tablet (40 mg total) by mouth at bedtime. 06/10/15  Yes [name] Done, FNP  warfarin (COUMADIN) 2 MG tablet TAKE ONE OR TWO TABLETS BY MOUTH ONCE DAILY AS DIRECTED BY ANTICOAGULATION Patient taking differently: TAKE ONE TABLET BY MOUTH ONCE DAILY AS DIRECTED BY ANTICOAGULATION 08/24/15  Yes [name] Done, FNP  denosumab (PROLIA) 60 MG/ML SOLN injection INJECT 60 MG INTO THE SKIN EVERY 6 MONTHS. BRING TO OFFICE FOR ADMINSTRATION 09/12/15   [name] Done, FNP  nitroGLYCERIN (NITROSTAT) 0.4 MG SL tablet Place 1 tablet (0.4 mg total) under the tongue every 5 (five) minutes as needed for chest pain. 08/18/15   Minus Breeding, MD    Family History Family  History  Problem Relation Age of Onset  . Stroke Father   . Heart attack Mother   . Diabetes Brother   . Heart disease Brother   . Early death Sister     Social History Social History  Substance Use Topics  . Smoking status: Never Smoker  . Smokeless tobacco: Current User    Types: Snuff  . Alcohol use No     Allergies   Codeine; Fosamax [alendronate sodium]; Penicillins; Sulfa antibiotics; Tramadol; Boniva [ibandronic acid]; Septra [sulfamethoxazole-trimethoprim]; and Vioxx [rofecoxib]   Review of Systems Review of Systems  Musculoskeletal: Positive for back pain.  Neurological: Negative for weakness.  All other systems reviewed and are negative.    Physical Exam Updated Vital Signs BP 134/87 (BP Location: Right Arm)   Pulse (!) 48   Temp 98.6 F (37 C) (Oral)   Resp 18   Wt 41.7 kg   SpO2 99%   BMI 24.87 kg/m   Physical Exam  Constitutional: She appears well-developed and well-nourished.  HENT:  Head: Normocephalic.  Eyes: EOM are normal. Pupils are equal, round, and reactive to light.  Neck: Normal range of motion.  Cardiovascular: Normal rate.   Pulmonary/Chest: Effort normal.  Abdominal: Soft.  Musculoskeletal: She exhibits tenderness.  Severe scolosis and kyphosis to thoracic spine,  Pain with movement.  From lower extremities.   Neurological: She is alert.  Skin: Skin is warm.  Psychiatric: She has a normal mood and affect.  Nursing note and vitals reviewed.    ED Treatments / Results  Labs (all labs ordered are listed, but only abnormal results are displayed) Labs Reviewed - No data to display  EKG  EKG Interpretation None       Radiology Dg Thoracic Spine 2 View  Result Date: 12/24/2015 CLINICAL DATA:  Back pain.  No known injury. EXAM: THORACIC SPINE 2 VIEWS COMPARISON:  08/13/2015 FINDINGS: Changes of vertebral augmentation from T9-T11. Severe compression fracture at T7 with moderate compression fracture at T6. This is stable  since prior study. Severe stable compression fracture T12. Severe kyphosis. IMPRESSION: Numerous moderate severe compression fractures in the mid and lower thoracic spine with 3 level vertebral augmentation changes. No change since prior study. Electronically Signed   By: Rolm Baptise M.D.   On: 12/24/2015 10:43   Ct Thoracic Spine Wo Contrast  Result Date: 12/24/2015 CLINICAL DATA:  Mid to low back pain.  No known injury. EXAM: CT THORACIC SPINE WITHOUT CONTRAST TECHNIQUE: Multidetector CT images of the thoracic were obtained using the standard protocol without intravenous contrast. COMPARISON:  Old films today.  Chest x-ray 08/08/2015 FINDINGS: Alignment: Severe kyphosis.  No subluxation. Vertebrae: Changes of prior vertebroplasty at T9, T10 and T11. Severe compression fracture at T7. Moderate compression fracture at T6, T8 and T12. These appear chronic when  compared to prior plain films. Paraspinal and other soft tissues: Small bilateral pleural effusions. No abnormal paraspinal soft tissues. Bibasilar atelectasis. Marked cardiomegaly with moderate pericardial effusion. Disc levels: Diffusely narrowed in the mid and lower thoracic spine. Mild anterior degenerative spurring. IMPRESSION: Multiple chronic compression fractures as described above which appear stable when compared to prior plain films. Multi level vertebroplasty changes T9-T11. Marked cardiomegaly, moderate pericardial effusion. Small pleural effusions. Electronically Signed   By: Rolm Baptise M.D.   On: 12/24/2015 12:06    Procedures Procedures (including critical care time)  Medications Ordered in ED Medications  oxyCODONE-acetaminophen (PERCOCET/ROXICET) 5-325 MG per tablet 1 tablet (1 tablet Oral Given 12/24/15 1127)     Initial Impression / Assessment and Plan / ED Course  I have reviewed the triage vital signs and the nursing notes.  Pertinent labs & imaging results that were available during my care of the patient were  reviewed by me and considered in my medical decision making (see chart for details).  Clinical Course    Current Meds  Medication Sig  . acetaminophen (TYLENOL) 500 MG tablet Take 1 tablet (500 mg total) by mouth every 8 (eight) hours as needed for mild pain.  Marland Kitchen amLODipine (NORVASC) 2.5 MG tablet Take 1 tablet (2.5 mg total) by mouth daily.  . brimonidine (ALPHAGAN P) 0.1 % SOLN Apply 1 drop to eye 2 (two) times daily.   Marland Kitchen doxycycline (VIBRA-TABS) 100 MG tablet Take 1 tablet (100 mg total) by mouth 2 (two) times daily.  . furosemide (LASIX) 20 MG tablet TAKE ONE TABLET BY MOUTH ONCE DAILY  . LUMIGAN 0.01 % SOLN INSTILL ONE DROP INTO EACH EYE AT BEDTIME  . simvastatin (ZOCOR) 40 MG tablet Take 1 tablet (40 mg total) by mouth at bedtime.  Marland Kitchen warfarin (COUMADIN) 2 MG tablet TAKE ONE OR TWO TABLETS BY MOUTH ONCE DAILY AS DIRECTED BY ANTICOAGULATION (Patient taking differently: TAKE ONE TABLET BY MOUTH ONCE DAILY AS DIRECTED BY ANTICOAGULATION)    Thoracic spine series shows multiple old compression fractures.  T spine ct shows old compression fractures.  Pt given percocet and reports some pain relief.   Pt's daughter is interested in PT/rehab for pt.  Pt's son is power of attorney.  He reports pt has care at home.  He will have pt follow up with primary care.   Final Clinical Impressions(s) / ED Diagnoses   Final diagnoses:  Acute thoracic back pain, unspecified back pain laterality    New Prescriptions Discharge Medication List as of 12/24/2015  1:10 PM    START taking these medications   Details  HYDROcodone-acetaminophen (NORCO/VICODIN) 5-325 MG tablet Take 2 tablets by mouth every 4 (four) hours as needed., Starting Sat 12/24/2015, [place], PA-C 12/24/15 [place], MD 12/26/15 [id number]

## 2015-12-24 NOTE — ED Notes (Signed)
Pt returned from CT °

## 2015-12-26 ENCOUNTER — Ambulatory Visit (HOSPITAL_COMMUNITY): Payer: Medicare Other | Admitting: Physical Therapy

## 2015-12-26 DIAGNOSIS — M79661 Pain in right lower leg: Secondary | ICD-10-CM

## 2015-12-26 DIAGNOSIS — M79662 Pain in left lower leg: Secondary | ICD-10-CM

## 2015-12-26 DIAGNOSIS — R262 Difficulty in walking, not elsewhere classified: Secondary | ICD-10-CM

## 2015-12-26 DIAGNOSIS — L97223 Non-pressure chronic ulcer of left calf with necrosis of muscle: Secondary | ICD-10-CM | POA: Diagnosis not present

## 2015-12-26 DIAGNOSIS — S91141D Puncture wound with foreign body of right great toe without damage to nail, subsequent encounter: Secondary | ICD-10-CM

## 2015-12-26 NOTE — Therapy (Signed)
[place], [place], [address] Phone: [phone]   Fax:  [phone]  Wound Care Therapy  Patient Details  Name: Candace [name] MRN: [id number] Date of Birth: Sep 05, 1922 Referring Provider: Particia Nearing   Encounter Date: 12/26/2015      PT End of Session - 12/26/15 1118    Visit Number 17   Number of Visits 25   Date for PT Re-Evaluation 11/23/15   Authorization Type [employer] medicare: g code and recert completed on visit 11   Authorization - Visit Number 17   Authorization - Number of Visits 21   PT Start Time [phone]   PT Stop Time 1035   PT Time Calculation (min) 43 min   Activity Tolerance Patient tolerated treatment well   Behavior During Therapy [facility] for tasks assessed/performed      Past Medical History:  Diagnosis Date  . Aortic stenosis   . Atrial fibrillation ([place])   . DVT (deep venous thrombosis) ([place])   . Essential hypertension   . Glaucoma   . History of skin cancer 2016  . Hypercholesterolemia   . PE (pulmonary embolism)     Past Surgical History:  Procedure Laterality Date  . CATARACT EXTRACTION    . CHOLECYSTECTOMY    . KYPHOPLASTY    . SKIN CANCER EXCISION Left 2016   neck    There were no vitals filed for this visit.                  Wound Therapy - 12/26/15 1059    Subjective Pt returns today with dressings intact.  States she went to the ED over the weekend for back pain.  Currently without pain.   Pain Score 0-No pain   Evaluation and Treatment Procedures Explained to Patient/Family Yes   Evaluation and Treatment Procedures agreed to   Wound Properties Date First Assessed: 11/08/15 Time First Assessed: 1130 Wound Type: Laceration Location: Leg Location Orientation: Right;Anterior Wound Description (Comments): Rt anterior shin, hit on recliner lever at home Present on Admission: No   Dressing Type Compression wrap;Gauze (Comment)   Dressing Changed Changed   Dressing Status Intact;Old drainage   Dressing Change Frequency PRN   Site / Wound Assessment Painful;Red;Yellow   % Wound base Red or Granulating 50%   % Wound base Yellow/Fibrinous Exudate 50%   Peri-wound Assessment Intact   Margins Attached edges (approximated)   Closure None   Drainage Amount Minimal   Drainage Description Serous   Treatment Hydrotherapy (Pulse lavage);Debridement (Selective)   Wound Properties Date First Assessed: 10/24/15 Time First Assessed: 1530 Wound Type: Puncture Location: Toe (Comment  which one) Location Orientation: Right Wound Description (Comments): plantar aspect of great toe    Dressing Type Silver hydrofiber   Dressing Changed Changed   Dressing Status Intact   Dressing Change Frequency PRN   Site / Wound Assessment Red;Yellow   % Wound base Red or Granulating 90%   % Wound base Yellow/Fibrinous Exudate 10%   Peri-wound Assessment Intact;Pink   Margins Unattached edges (unapproximated)   Drainage Amount Moderate   Drainage Description Purulent;No odor   Treatment Hydrotherapy (Pulse lavage)   Wound Properties Date First Assessed: 10/24/15 Time First Assessed: 1615 Wound Type: Other (Comment) Location: Leg Location Orientation: Left;Posterior;Lower Wound Description (Comments): dry Present on Admission: Yes   Dressing Type Gauze (Comment)   Dressing Changed Changed   Dressing Status Old drainage   Dressing Change Frequency PRN   Site /  Wound Assessment Pink;Yellow;Painful;Granulation tissue;Dusky   % Wound base Red or Granulating 70%   % Wound base Yellow/Fibrinous Exudate 30%   Peri-wound Assessment Pink;Intact   Margins Unattached edges (unapproximated)   Drainage Amount Minimal   Drainage Description Serosanguineous   Treatment Hydrotherapy (Pulse lavage);Debridement (Selective)   Pulsed lavage therapy - wound location to all wounds    Pulsed Lavage with Suction (psi) 4 psi   Pulsed Lavage with Suction - Normal Saline Used 1000 mL    Pulsed Lavage Tip Tip with splash shield   Selective Debridement - Location to Rt anterior and Lt posterior wounds .    Selective Debridement - Tools Used Forceps;Scalpel   Selective Debridement - Tissue Removed slough    Wound Therapy - Clinical Statement Pt able to keep all layers of bandaging intact since last session. Noted reduction in swelling this session and improved tolerance to gentle debridement.  Rt great toe continues to drain purlulent drainage.  Rt anterior wound with noted improvement and posterior Lt LE wound with slower but steady improvement.  Continued with kerlix and coban for compression.   Wound Therapy - Functional Problem List difficult walking    Factors Delaying/Impairing Wound Healing Altered sensation;Infection - systemic/local;Multiple medical problems;Polypharmacy;Vascular compromise   Hydrotherapy Plan Debridement;Dressing change;Patient/family education;Pulsatile lavage with suction   Wound Therapy - Frequency Other (comment)   Wound Therapy - Current Recommendations Other (comment)   Wound Plan Pt to recieve pulse lavage, debridement and dressing change to all wounds on bilateral LE's.   Dressing  Rt great toe: silverhydrofiber packed into wound followed by 2x2 and 2" kling;   Dressing B LE: anterior and Lt posterior  medihoney colloid followed by 4x4; kerlix, coban and #6 netting                   PT Short Term Goals - 11/30/15 1457      PT SHORT TERM GOAL #1   Title Pt Lt LE to have 50% granulation to decrease risk of infection   Time 8  time updated due to pt not coming for rx for the past 2 weeks and hospitalizations    Period Weeks   Status On-going     PT SHORT TERM GOAL #2   Title Pt and familty to be able to verbalize the signs and symptoms of infection and the importance of seeking medical help   Time 1   Period Weeks   Status Achieved     PT SHORT TERM GOAL #3   Title Pt Rt toe wound to no longer have purulent drainage to  demonstrate decreased infection    Time 3   Period Weeks   Status Achieved     PT SHORT TERM GOAL #4   Title Pt pain in both legs to be no greater than a 5/10 to allow pt to ambulate in comfort in her home   Baseline --   Time 3   Period Weeks   Status Achieved           PT Long Term Goals - 11/30/15 1502      PT LONG TERM GOAL #1   Title Lt LE wound to be healed    Time 12  time revised due to pt being in hospital and missing appointments then pt not being seen for 2 weeks due to daughter attempting to find facility for pt to live    Period Weeks   Status Not Met     PT LONG TERM  GOAL #2   Title Rt great toe wound to have filled in depth to no greater than .3 and minimal undermining to allow family to be comfortable with dressing wound    Baseline 11/30/2015 depth is now .3 but wound continues to have significant undermining.    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 Pain of left hand 11/08/2015  . Hand strain, left, initial encounter 11/08/2015  . Cellulitis 10/25/2015  . Cellulitis of right foot 10/24/2015  . Hypokalemia 10/24/2015  . Bradycardia 10/24/2015  . Cellulitis of foot, right 10/24/2015  . Wound infection   . Aortic atherosclerosis ([place]) 09/01/2015  . Sick sinus syndrome ([place]) 08/11/2015  . Non-sustained ventricular tachycardia ([place]) 08/11/2015  . Anticoagulated on Coumadin 08/11/2015  . CKD (chronic kidney disease) stage 3, GFR 30-59 ml/min  08/09/2015  . Elevated troponin 08/08/2015  . Renal insufficiency 08/08/2015  . Severe aortic stenosis 08/05/2015  . Hyperlipidemia 06/10/2015  . Tobacco use disorder 01/28/2014  . Osteoporosis with pathological fracture 08/13/2012  . DVT (deep venous thrombosis), right 05/13/2012  . HTN (hypertension) 05/13/2012  . Long term current use of anticoagulant therapy 05/13/2012  . Atrial fibrillation ([place]) 04/15/2012    Teena Irani, PTA/CLT [phone]  12/26/2015, 11:20 AM  [facility] [address] [place], [place], [address] Phone: [phone]   Fax:  [phone]  Name: Candace [name] MRN: [id number] Date of Birth: [birth year]

## 2015-12-29 ENCOUNTER — Ambulatory Visit (HOSPITAL_COMMUNITY): Payer: Medicare Other | Admitting: Physical Therapy

## 2015-12-29 DIAGNOSIS — S91141D Puncture wound with foreign body of right great toe without damage to nail, subsequent encounter: Secondary | ICD-10-CM

## 2015-12-29 DIAGNOSIS — R262 Difficulty in walking, not elsewhere classified: Secondary | ICD-10-CM | POA: Diagnosis not present

## 2015-12-29 DIAGNOSIS — M79662 Pain in left lower leg: Secondary | ICD-10-CM

## 2015-12-29 DIAGNOSIS — M79661 Pain in right lower leg: Secondary | ICD-10-CM | POA: Diagnosis not present

## 2015-12-29 DIAGNOSIS — L97223 Non-pressure chronic ulcer of left calf with necrosis of muscle: Secondary | ICD-10-CM | POA: Diagnosis not present

## 2015-12-29 NOTE — Therapy (Signed)
[place], [place], [address] Phone: [phone]   Fax:  [phone]  Physical Therapy Treatment  Patient Details  Name: Candace [name] MRN: [id number] Date of Birth: [birth year] Referring Provider: Particia Nearing   Encounter Date: 12/29/2015      PT End of Session - 12/29/15 1820    Visit Number 18   Number of Visits 25   Date for PT Re-Evaluation 11/23/15   Authorization Type [employer] medicare: g code and recert completed on visit 11   Authorization - Visit Number 18   Authorization - Number of Visits 21   PT Start Time 1440   PT Stop Time 1520   PT Time Calculation (min) 40 min   Activity Tolerance Patient tolerated treatment well   Behavior During Therapy [facility] for tasks assessed/performed      Past Medical History:  Diagnosis Date  . Aortic stenosis   . Atrial fibrillation ([place])   . DVT (deep venous thrombosis) ([place])   . Essential hypertension   . Glaucoma   . History of skin cancer 2016  . Hypercholesterolemia   . PE (pulmonary embolism)     Past Surgical History:  Procedure Laterality Date  . CATARACT EXTRACTION    . CHOLECYSTECTOMY    . KYPHOPLASTY    . SKIN CANCER EXCISION Left 2016   neck    There were no vitals filed for this visit.                     Wound Therapy - 12/29/15 1814    Subjective Dressings intact again this session.  No pain reported.    Pain Score 0-No pain   Evaluation and Treatment Procedures Explained to Patient/Family Yes   Evaluation and Treatment Procedures agreed to   Wound Properties Date First Assessed: 11/08/15 Time First Assessed: 1130 Wound Type: Laceration Location: Leg Location Orientation: Right;Anterior Wound Description (Comments): Rt anterior shin, hit on recliner lever at home Present on Admission: No   Dressing Type Compression wrap;Gauze (Comment)   Dressing Changed Changed   Dressing Status Intact;Old drainage   Dressing Change  Frequency PRN   Site / Wound Assessment Painful;Red;Yellow   % Wound base Red or Granulating 60%   % Wound base Yellow/Fibrinous Exudate 40%   Peri-wound Assessment Intact   Margins Attached edges (approximated)   Closure None   Drainage Amount Minimal   Drainage Description Serous   Treatment Hydrotherapy (Pulse lavage);Cleansed;Debridement (Selective)   Wound Properties Date First Assessed: 10/24/15 Time First Assessed: 1530 Wound Type: Puncture Location: Toe (Comment  which one) Location Orientation: Right Wound Description (Comments): plantar aspect of great toe    Dressing Type Silver hydrofiber   Dressing Changed Changed   Dressing Status Intact   Dressing Change Frequency PRN   Site / Wound Assessment Red;Yellow   % Wound base Red or Granulating 90%   % Wound base Yellow/Fibrinous Exudate 10%   Peri-wound Assessment Intact;Pink   Margins Unattached edges (unapproximated)   Drainage Amount Moderate   Drainage Description Purulent;No odor   Treatment Hydrotherapy (Pulse lavage)   Wound Properties Date First Assessed: 10/24/15 Time First Assessed: 1615 Wound Type: Other (Comment) Location: Leg Location Orientation: Left;Posterior;Lower Wound Description (Comments): dry Present on Admission: Yes   Dressing Type Gauze (Comment)   Dressing Status Old drainage   Dressing Change Frequency PRN   Site / Wound Assessment Pink;Yellow;Painful;Granulation tissue;Dusky   % Wound base Red or Granulating 70%   %  Wound base Yellow/Fibrinous Exudate 30%   Peri-wound Assessment Pink;Intact   Margins Unattached edges (unapproximated)   Drainage Amount Minimal   Drainage Description Serosanguineous   Treatment Hydrotherapy (Pulse lavage);Debridement (Selective)   Pulsed lavage therapy - wound location to all wounds    Pulsed Lavage with Suction (psi) 4 psi   Pulsed Lavage with Suction - Normal Saline Used 1000 mL   Pulsed Lavage Tip Tip with splash shield   Selective Debridement - Location to  Rt anterior and Lt posterior wounds .    Selective Debridement - Tools Used Forceps;Scalpel   Selective Debridement - Tissue Removed slough    Wound Therapy - Clinical Statement continued compliance with bandages. Able to remove more slough from Rt anterior shin.  Toe still with purlulent drainage, however appears to have less depth as before.  Lt posterior wound continues to be most tender.    Wound Therapy - Functional Problem List difficult walking    Factors Delaying/Impairing Wound Healing Altered sensation;Infection - systemic/local;Multiple medical problems;Polypharmacy;Vascular compromise   Hydrotherapy Plan Debridement;Dressing change;Patient/family education;Pulsatile lavage with suction   Wound Therapy - Frequency Other (comment)   Wound Therapy - Current Recommendations Other (comment)   Wound Plan Pt to recieve pulse lavage, debridement and dressing change to all wounds on bilateral LE's.   Dressing  Rt great toe: silverhydrofiber packed into wound followed by 2x2 and 2" kling;   Dressing B LE: anterior and Lt posterior  medihoney colloid followed by 4x4; kerlix, coban and #6 netting                    PT Short Term Goals - 11/30/15 1457      PT SHORT TERM GOAL #1   Title Pt Lt LE to have 50% granulation to decrease risk of infection   Time 8  time updated due to pt not coming for rx for the past 2 weeks and hospitalizations    Period Weeks   Status On-going     PT SHORT TERM GOAL #2   Title Pt and familty to be able to verbalize the signs and symptoms of infection and the importance of seeking medical help   Time 1   Period Weeks   Status Achieved     PT SHORT TERM GOAL #3   Title Pt Rt toe wound to no longer have purulent drainage to demonstrate decreased infection    Time 3   Period Weeks   Status Achieved     PT SHORT TERM GOAL #4   Title Pt pain in both legs to be no greater than a 5/10 to allow pt to ambulate in comfort in her home   Baseline --    Time 3   Period Weeks   Status Achieved           PT Long Term Goals - 11/30/15 1502      PT LONG TERM GOAL #1   Title Lt LE wound to be healed    Time 12  time revised due to pt being in hospital and missing appointments then pt not being seen for 2 weeks due to daughter attempting to find facility for pt to live    Period Weeks   Status Not Met     PT LONG TERM GOAL #2   Title Rt great toe wound to have filled in depth to no greater than .3 and minimal undermining to allow family to be comfortable with dressing wound    Baseline 11/30/2015  depth is now .3 but wound continues to have significant undermining.    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 Pain of left hand 11/08/2015  . Hand strain, left, initial encounter 11/08/2015  . Cellulitis 10/25/2015  . Cellulitis of right foot 10/24/2015  . Hypokalemia 10/24/2015  . Bradycardia 10/24/2015  . Cellulitis of foot, right 10/24/2015  . Wound infection   . Aortic atherosclerosis ([place]) 09/01/2015  . Sick sinus syndrome ([place]) 08/11/2015  . Non-sustained ventricular tachycardia ([place]) 08/11/2015  . Anticoagulated on Coumadin 08/11/2015  . CKD (chronic kidney disease) stage 3, GFR 30-59 ml/min 08/09/2015  . Elevated troponin 08/08/2015  . Renal insufficiency 08/08/2015  . Severe aortic stenosis 08/05/2015  . Hyperlipidemia 06/10/2015  . Tobacco use disorder 01/28/2014  . Osteoporosis with pathological fracture  08/13/2012  . DVT (deep venous thrombosis), right 05/13/2012  . HTN (hypertension) 05/13/2012  . Long term current use of anticoagulant therapy 05/13/2012  . Atrial fibrillation ([place]) 04/15/2012    Teena Irani, PTA/CLT [phone]  12/29/2015, 6:21 PM  [facility] [address] [place], [place], [address] Phone: [phone]   Fax:  [phone]  Name: Candace [name] MRN: [id number] Date of Birth: 09-Oct-1922

## 2016-01-03 ENCOUNTER — Ambulatory Visit (HOSPITAL_COMMUNITY): Payer: Medicare Other

## 2016-01-03 DIAGNOSIS — M79661 Pain in right lower leg: Secondary | ICD-10-CM | POA: Diagnosis not present

## 2016-01-03 DIAGNOSIS — S91141D Puncture wound with foreign body of right great toe without damage to nail, subsequent encounter: Secondary | ICD-10-CM | POA: Diagnosis not present

## 2016-01-03 DIAGNOSIS — M79662 Pain in left lower leg: Secondary | ICD-10-CM

## 2016-01-03 DIAGNOSIS — R262 Difficulty in walking, not elsewhere classified: Secondary | ICD-10-CM

## 2016-01-03 DIAGNOSIS — L97223 Non-pressure chronic ulcer of left calf with necrosis of muscle: Secondary | ICD-10-CM | POA: Diagnosis not present

## 2016-01-03 NOTE — Therapy (Signed)
[place], [place], [address] Phone: [phone]   Fax:  [phone]  Wound Care Therapy  Patient Details  Name: Candace [name] MRN: [id number] Date of Birth: 1922-02-10 Referring Provider: Particia Nearing   Encounter Date: 01/03/2016      PT End of Session - 01/03/16 1857    Visit Number 19   Number of Visits 25   Date for PT Re-Evaluation 12/28/15   Authorization Type [employer] medicare: g code and recert completed on visit 11   Authorization - Visit Number 19   Authorization - Number of Visits 21   PT Start Time 6010   PT Stop Time 1608   PT Time Calculation (min) 50 min   Activity Tolerance Patient tolerated treatment well;No increased pain   Behavior During Therapy WFL for tasks assessed/performed      Past Medical History:  Diagnosis Date  . Aortic stenosis   . Atrial fibrillation ([place])   . DVT (deep venous thrombosis) ([place])   . Essential hypertension   . Glaucoma   . History of skin cancer 2016  . Hypercholesterolemia   . PE (pulmonary embolism)     Past Surgical History:  Procedure Laterality Date  . CATARACT EXTRACTION    . CHOLECYSTECTOMY    . KYPHOPLASTY    . SKIN CANCER EXCISION Left 2016   neck    There were no vitals filed for this visit.       Subjective Assessment - 01/03/16 1847    Subjective Daugther stated Mom continues to remove dressings off Lt LE, c/o being too tight.  Current pain scale 8/10 Lt LE.   Currently in Pain? Yes   Pain Score 8    Pain Location Leg   Pain Orientation Left;Posterior   Pain Descriptors / Indicators Aching;Sore   Pain Type Chronic pain                   Wound Therapy - 01/03/16 1847    Subjective Daugther stated Mom continues to remove dressings off Lt LE, c/o being too tight.  Current pain scale 8/10 Lt LE.   Pain Onset On-going   Patients Stated Pain Goal 0   Pain Intervention(s) Emotional support   Multiple Pain Sites No   Evaluation and Treatment Procedures Explained to Patient/Family Yes   Evaluation and Treatment Procedures agreed to   Wound Properties Date First Assessed: 11/08/15 Time First Assessed: 1130 Wound Type: Laceration Location: Leg Location Orientation: Right;Anterior Wound Description (Comments): Rt anterior shin, hit on recliner lever at home Present on Admission: No   Dressing Type Gauze (Comment)  B LE: anterior and Lt posterior  medihoney colloid followed    Dressing Changed Changed   Dressing Status Intact;Old drainage   Dressing Change Frequency PRN   Site / Wound Assessment Painful;Red;Yellow   % Wound base Red or Granulating 60%   % Wound base Yellow/Fibrinous Exudate 40%   Peri-wound Assessment Intact   Margins Attached edges (approximated)   Closure None   Drainage Amount Minimal   Drainage Description Serous   Treatment Hydrotherapy (Pulse lavage);Cleansed;Debridement (Selective)   Wound Properties Date First Assessed: 10/24/15 Time First Assessed: 1530 Wound Type: Puncture Location: Toe (Comment  which one) Location Orientation: Right Wound Description (Comments): plantar aspect of great toe    Dressing Type Silver hydrofiber;Gauze (Comment)   Dressing Changed Changed   Dressing Status Intact   Dressing Change Frequency PRN   Site / Wound Assessment  Red;Yellow   % Wound base Red or Granulating 90%   % Wound base Yellow/Fibrinous Exudate 10%   Peri-wound Assessment Intact;Pink   Wound Length (cm) 0.6 cm   Wound Width (cm) 1.1 cm   Wound Depth (cm) 0.3 cm   Margins Unattached edges (unapproximated)   Drainage Amount Moderate   Drainage Description Purulent;No odor   Treatment Hydrotherapy (Pulse lavage);Cleansed;Debridement (Selective)   Wound Properties Date First Assessed: 10/24/15 Time First Assessed: 1615 Wound Type: Other (Comment) Location: Leg Location Orientation: Left;Posterior;Lower Wound Description (Comments): dry Present on Admission: Yes   Dressing Type Gauze  (Comment)  B LE: anterior and Lt posterior  medihoney colloid followed    Dressing Changed Changed   Dressing Status Old drainage   Dressing Change Frequency PRN   Site / Wound Assessment Pink;Yellow;Painful;Granulation tissue;Dusky   % Wound base Red or Granulating 30%   % Wound base Yellow/Fibrinous Exudate 30%   % Wound base Black/Eschar 40%   Peri-wound Assessment Pink;Intact   Wound Length (cm) 2.4 cm   Wound Width (cm) 2.7 cm   Wound Depth (cm) --  unknown due to black eschar   Margins Unattached edges (unapproximated)   Drainage Amount Minimal   Drainage Description Serosanguineous   Treatment Hydrotherapy (Pulse lavage);Cleansed;Debridement (Selective)   Pulsed lavage therapy - wound location to all wounds    Pulsed Lavage with Suction (psi) 4 psi   Pulsed Lavage with Suction - Normal Saline Used 1000 mL   Pulsed Lavage Tip Tip with splash shield   Selective Debridement - Location to Rt anterior and Lt posterior wounds .    Selective Debridement - Tools Used Forceps;Scalpel   Selective Debridement - Tissue Removed slough    Wound Therapy - Clinical Statement Pt arrived without dressing on Lt LE.  Pt educated on importance of keeping dressings intact to promote healing and reduce edema.  Continued with PLS wiht selective debridement for removal of slough from wound bed.  Pt able to tolerate tx with no reports of pain reduced to 2-3/10 at EOS.     Wound Therapy - Functional Problem List difficult walking    Factors Delaying/Impairing Wound Healing Altered sensation;Infection - systemic/local;Multiple medical problems;Polypharmacy;Vascular compromise   Hydrotherapy Plan Debridement;Dressing change;Patient/family education;Pulsatile lavage with suction   Wound Therapy - Frequency Other (comment)  2x/week for 6 weeks   Wound Plan Pt to recieve pulse lavage, debridement and dressing change to all wounds on bilateral LE's.   Dressing  Rt great toe: silverhydrofiber packed into wound  followed by 2x2 and 2" kling;   Dressing B LE: anterior and Lt posterior  medihoney colloid followed by 4x4; kerlix, coban and #6 netting                   PT Short Term Goals - 11/30/15 1457      PT SHORT TERM GOAL #1   Title Pt Lt LE to have 50% granulation to decrease risk of infection   Time 8  time updated due to pt not coming for rx for the past 2 weeks and hospitalizations    Period Weeks   Status On-going     PT SHORT TERM GOAL #2   Title Pt and familty to be able to verbalize the signs and symptoms of infection and the importance of seeking medical help   Time 1   Period Weeks   Status Achieved     PT SHORT TERM GOAL #3   Title Pt Rt toe wound to  no longer have purulent drainage to demonstrate decreased infection    Time 3   Period Weeks   Status Achieved     PT SHORT TERM GOAL #4   Title Pt pain in both legs to be no greater than a 5/10 to allow pt to ambulate in comfort in her home   Baseline --   Time 3   Period Weeks   Status Achieved           PT Long Term Goals - 11/30/15 1502      PT LONG TERM GOAL #1   Title Lt LE wound to be healed    Time 12  time revised due to pt being in hospital and missing appointments then pt not being seen for 2 weeks due to daughter attempting to find facility for pt to live    Period Weeks   Status Not Met     PT LONG TERM GOAL #2   Title Rt great toe wound to have filled in depth to no greater than .3 and minimal undermining to allow family to be comfortable with dressing wound    Baseline 11/30/2015 depth is now .3 but wound continues to have significant undermining.    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 Pain of left hand 11/08/2015  . Hand strain, left, initial encounter 11/08/2015  . Cellulitis 10/25/2015  . Cellulitis of right foot 10/24/2015  . Hypokalemia 10/24/2015  . Bradycardia 10/24/2015  . Cellulitis of foot, right 10/24/2015  . Wound infection   . Aortic atherosclerosis ([place]) 09/01/2015  . Sick sinus syndrome ([place]) 08/11/2015  . Non-sustained ventricular tachycardia ([place]) 08/11/2015  . Anticoagulated on Coumadin 08/11/2015  . CKD (chronic kidney disease) stage 3, GFR 30-59 ml/min 08/09/2015  . Elevated troponin 08/08/2015  . Renal insufficiency 08/08/2015  . Severe aortic stenosis 08/05/2015  . Hyperlipidemia 06/10/2015  . Tobacco use disorder 01/28/2014  . Osteoporosis with pathological fracture 08/13/2012  . DVT (deep venous thrombosis), right 05/13/2012  . HTN (hypertension) 05/13/2012  . Long term current use of anticoagulant therapy 05/13/2012  . Atrial fibrillation [facility]) 04/15/2012   Ihor Austin, [place]; [place]  Aldona Lento 01/03/2016, 7:02 PM  [facility] [address] [place], [place], [address] Phone: [phone]   Fax:  [phone]  Name: Candace [name] MRN: [id number] Date of Birth: 06/25/1922

## 2016-01-04 ENCOUNTER — Ambulatory Visit (INDEPENDENT_AMBULATORY_CARE_PROVIDER_SITE_OTHER): Payer: Medicare Other | Admitting: Pharmacist

## 2016-01-04 DIAGNOSIS — I82401 Acute embolism and thrombosis of unspecified deep veins of right lower extremity: Secondary | ICD-10-CM

## 2016-01-04 DIAGNOSIS — Z7901 Long term (current) use of anticoagulants: Secondary | ICD-10-CM

## 2016-01-04 DIAGNOSIS — I4891 Unspecified atrial fibrillation: Secondary | ICD-10-CM | POA: Diagnosis not present

## 2016-01-04 LAB — COAGUCHEK XS/INR WAIVED
INR: 1.9 — ABNORMAL HIGH (ref 0.9–1.1)
Prothrombin Time: 22.4 s

## 2016-01-05 DIAGNOSIS — I83022 Varicose veins of left lower extremity with ulcer of calf: Secondary | ICD-10-CM | POA: Diagnosis not present

## 2016-01-05 DIAGNOSIS — L97222 Non-pressure chronic ulcer of left calf with fat layer exposed: Secondary | ICD-10-CM | POA: Diagnosis not present

## 2016-01-05 DIAGNOSIS — R296 Repeated falls: Secondary | ICD-10-CM | POA: Diagnosis not present

## 2016-01-06 ENCOUNTER — Telehealth: Payer: Self-pay | Admitting: Nurse Practitioner

## 2016-01-06 ENCOUNTER — Ambulatory Visit (HOSPITAL_COMMUNITY): Payer: Medicare Other

## 2016-01-06 DIAGNOSIS — L97223 Non-pressure chronic ulcer of left calf with necrosis of muscle: Secondary | ICD-10-CM

## 2016-01-06 DIAGNOSIS — R262 Difficulty in walking, not elsewhere classified: Secondary | ICD-10-CM | POA: Diagnosis not present

## 2016-01-06 DIAGNOSIS — M79661 Pain in right lower leg: Secondary | ICD-10-CM

## 2016-01-06 DIAGNOSIS — S91141D Puncture wound with foreign body of right great toe without damage to nail, subsequent encounter: Secondary | ICD-10-CM

## 2016-01-06 DIAGNOSIS — M79662 Pain in left lower leg: Secondary | ICD-10-CM

## 2016-01-06 NOTE — Therapy (Signed)
[place], [place], [address] Phone: [phone]   Fax:  [phone]  Wound Care Therapy  Patient Details  Name: Candace [name] MRN: [id number] Date of Birth: 1922-07-20 Referring Provider: Particia Nearing   Encounter Date: 01/06/2016      PT End of Session - 01/06/16 1450    Visit Number 20   Number of Visits 25   Date for PT Re-Evaluation 12/28/15   Authorization Type [employer] medicare: g code and recert completed on visit 11   Authorization - Visit Number 20   Authorization - Number of Visits 21   PT Start Time 1505   PT Stop Time 1443   PT Time Calculation (min) 55 min   Activity Tolerance Patient tolerated treatment well;No increased pain   Behavior During Therapy WFL for tasks assessed/performed      Past Medical History:  Diagnosis Date  . Aortic stenosis   . Atrial fibrillation ([place])   . DVT (deep venous thrombosis) ([place])   . Essential hypertension   . Glaucoma   . History of skin cancer 2016  . Hypercholesterolemia   . PE (pulmonary embolism)     Past Surgical History:  Procedure Laterality Date  . CATARACT EXTRACTION    . CHOLECYSTECTOMY    . KYPHOPLASTY    . SKIN CANCER EXCISION Left 2016   neck    There were no vitals filed for this visit.       Subjective Assessment - 01/06/16 1807    Subjective Pt arrived with BLE dressings intact, stated she could tolerate dressings because they were not too tight.  Current pain scale 8/10 BLE   Currently in Pain? Yes   Pain Score 8    Pain Location Leg   Pain Orientation Left   Pain Descriptors / Indicators Aching;Sore   Pain Type Chronic pain                   Wound Therapy - 01/06/16 1807    Subjective Pt arrived with BLE dressings intact, stated she could tolerate dressings because they were not too tight.  Current pain scale 8/10 BLE   Patient and Family Stated Goals Wounds to heal    Date of Onset 09/23/15   Prior  Treatments peroxide to Lt wound and antibiotic.    Pain Assessment 0-10   Pain Onset On-going   Patients Stated Pain Goal 0   Pain Intervention(s) Emotional support   Multiple Pain Sites No   Evaluation and Treatment Procedures Explained to Patient/Family Yes   Evaluation and Treatment Procedures agreed to   Wound Properties Date First Assessed: 11/08/15 Time First Assessed: 1130 Wound Type: Laceration Location: Leg Location Orientation: Right;Anterior Wound Description (Comments): Rt anterior shin, hit on recliner lever at home Present on Admission: No   Dressing Type Gauze (Comment);Compression wrap   Dressing Changed Changed   Dressing Status Intact;Old drainage   Dressing Change Frequency PRN   Site / Wound Assessment Painful;Red;Yellow   % Wound base Red or Granulating 60%   % Wound base Yellow/Fibrinous Exudate 40%   Peri-wound Assessment Intact   Margins Attached edges (approximated)   Closure None   Drainage Amount Minimal   Drainage Description Serous   Treatment Hydrotherapy (Pulse lavage);Cleansed;Debridement (Selective)   Wound Properties Date First Assessed: 10/24/15 Time First Assessed: 1530 Wound Type: Puncture Location: Toe (Comment  which one) Location Orientation: Right Wound Description (Comments): plantar aspect of great toe  Dressing Type Silver hydrofiber;Gauze (Comment)   Dressing Changed Changed   Dressing Status Intact   Dressing Change Frequency PRN   Site / Wound Assessment Red;Yellow   % Wound base Red or Granulating 90%   % Wound base Yellow/Fibrinous Exudate 10%   Peri-wound Assessment Intact;Pink   Margins Unattached edges (unapproximated)   Drainage Amount Moderate   Drainage Description Purulent;No odor   Treatment Hydrotherapy (Pulse lavage);Cleansed;Debridement (Selective)   Wound Properties Date First Assessed: 10/24/15 Time First Assessed: 1615 Wound Type: Other (Comment) Location: Leg Location Orientation: Left;Posterior;Lower Wound  Description (Comments): dry Present on Admission: Yes   Dressing Type Gauze (Comment);Compression wrap  Silverhydrofiber   Dressing Changed Changed   Dressing Status Old drainage   Dressing Change Frequency PRN   Site / Wound Assessment Pink;Yellow;Painful;Granulation tissue;Dusky   % Wound base Red or Granulating 30%   % Wound base Yellow/Fibrinous Exudate 30%   % Wound base Black/Eschar 40%   Peri-wound Assessment Pink;Intact   Margins Unattached edges (unapproximated)   Drainage Amount Minimal   Drainage Description Serosanguineous   Treatment Hydrotherapy (Pulse lavage);Cleansed;Debridement (Selective)   Pulsed lavage therapy - wound location to all wounds    Pulsed Lavage with Suction (psi) 4 psi   Pulsed Lavage with Suction - Normal Saline Used 1000 mL   Pulsed Lavage Tip Tip with splash shield   Selective Debridement - Location to Rt anterior and Lt posterior wounds .    Selective Debridement - Tools Used Forceps;Scalpel   Selective Debridement - Tissue Removed slough    Wound Therapy - Clinical Statement Pt arrived with dressings on BLE, stated she could tolerate dressings because they were not too tight.  Vast improvements noted with Lt LE decreased edema per tolerance of dressings.  Continued with selective debridement and PLS for remvoal of slough and eschar.  Pt tolerated well to treatment with reports of pain reduced at end of session.  Continued with silverhydrofiber to toe and colloid medihoney to BLE with gauze and compression dressings.     Wound Therapy - Functional Problem List difficult walking    Factors Delaying/Impairing Wound Healing Altered sensation;Infection - systemic/local;Multiple medical problems;Polypharmacy;Vascular compromise   Hydrotherapy Plan Debridement;Dressing change;Patient/family education;Pulsatile lavage with suction   Wound Therapy - Frequency Other (comment)  2x/ wk x 6 weeks   Wound Plan Pt to recieve pulse lavage, debridement and dressing  change to all wounds on bilateral LE's.   Dressing  Rt great toe: silverhydrofiber packed into wound followed by 2x2 and 2" kling;   Dressing B LE: anterior and Lt posterior  medihoney colloid followed by 4x4; kerlix, coban and #6 netting                   PT Short Term Goals - 11/30/15 1457      PT SHORT TERM GOAL #1   Title Pt Lt LE to have 50% granulation to decrease risk of infection   Time 8  time updated due to pt not coming for rx for the past 2 weeks and hospitalizations    Period Weeks   Status On-going     PT SHORT TERM GOAL #2   Title Pt and familty to be able to verbalize the signs and symptoms of infection and the importance of seeking medical help   Time 1   Period Weeks   Status Achieved     PT SHORT TERM GOAL #3   Title Pt Rt toe wound to no longer have purulent drainage to  demonstrate decreased infection    Time 3   Period Weeks   Status Achieved     PT SHORT TERM GOAL #4   Title Pt pain in both legs to be no greater than a 5/10 to allow pt to ambulate in comfort in her home   Baseline --   Time 3   Period Weeks   Status Achieved           PT Long Term Goals - 11/30/15 1502      PT LONG TERM GOAL #1   Title Lt LE wound to be healed    Time 12  time revised due to pt being in hospital and missing appointments then pt not being seen for 2 weeks due to daughter attempting to find facility for pt to live    Period Weeks   Status Not Met     PT LONG TERM GOAL #2   Title Rt great toe wound to have filled in depth to no greater than .3 and minimal undermining to allow family to be comfortable with dressing wound    Baseline 11/30/2015 depth is now .3 but wound continues to have significant undermining.    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 Pain of left hand 11/08/2015  . Hand strain, left, initial encounter 11/08/2015  . Cellulitis 10/25/2015  . Cellulitis of right foot 10/24/2015  . Hypokalemia 10/24/2015  . Bradycardia 10/24/2015  . Cellulitis of foot, right 10/24/2015  . Wound infection   . Aortic atherosclerosis ([place]) 09/01/2015  . Sick sinus syndrome ([place]) 08/11/2015  . Non-sustained ventricular tachycardia ([place]) 08/11/2015  . Anticoagulated on Coumadin 08/11/2015  . CKD (chronic kidney disease) stage 3, GFR 30-59 ml/min 08/09/2015  . Elevated troponin 08/08/2015  . Renal insufficiency 08/08/2015  . Severe aortic stenosis 08/05/2015  . Hyperlipidemia 06/10/2015  . Tobacco use disorder 01/28/2014  . Osteoporosis with pathological fracture 08/13/2012  . DVT (deep venous thrombosis), right 05/13/2012  . HTN (hypertension) 05/13/2012  . Long term current use of anticoagulant therapy 05/13/2012  . Atrial fibrillation [facility]) 04/15/2012   Ihor Austin, [place]; [place]  Aldona Lento 01/06/2016, 6:23 PM  [facility] [address] [place], [place], [address] Phone: [phone]   Fax:  [phone]  Name: Candace [name] MRN: [id number] Date of Birth: [birth year]

## 2016-01-06 NOTE — Telephone Encounter (Signed)
Has to be seen  for pain meds 

## 2016-01-06 NOTE — Telephone Encounter (Signed)
[email or web address]

## 2016-01-10 ENCOUNTER — Ambulatory Visit (HOSPITAL_COMMUNITY): Payer: Medicare Other | Attending: Physician Assistant | Admitting: Physical Therapy

## 2016-01-10 ENCOUNTER — Encounter: Payer: Self-pay | Admitting: Nurse Practitioner

## 2016-01-10 ENCOUNTER — Ambulatory Visit (INDEPENDENT_AMBULATORY_CARE_PROVIDER_SITE_OTHER): Payer: Medicare Other | Admitting: Nurse Practitioner

## 2016-01-10 VITALS — BP 140/76 | HR 78 | Temp 96.8°F | Ht <= 58 in | Wt 88.0 lb

## 2016-01-10 DIAGNOSIS — R262 Difficulty in walking, not elsewhere classified: Secondary | ICD-10-CM | POA: Diagnosis not present

## 2016-01-10 DIAGNOSIS — G8929 Other chronic pain: Secondary | ICD-10-CM | POA: Diagnosis not present

## 2016-01-10 DIAGNOSIS — M545 Low back pain: Secondary | ICD-10-CM

## 2016-01-10 DIAGNOSIS — M79661 Pain in right lower leg: Secondary | ICD-10-CM | POA: Diagnosis not present

## 2016-01-10 DIAGNOSIS — S91141D Puncture wound with foreign body of right great toe without damage to nail, subsequent encounter: Secondary | ICD-10-CM | POA: Insufficient documentation

## 2016-01-10 DIAGNOSIS — M79662 Pain in left lower leg: Secondary | ICD-10-CM

## 2016-01-10 DIAGNOSIS — L97223 Non-pressure chronic ulcer of left calf with necrosis of muscle: Secondary | ICD-10-CM | POA: Insufficient documentation

## 2016-01-10 MED ORDER — HYDROCODONE-ACETAMINOPHEN 5-325 MG PO TABS: 1.0000 | ORAL_TABLET | ORAL | 0 refills | Status: AC | PRN

## 2016-01-10 NOTE — Therapy (Addendum)
[place], [place], [address] Phone: [phone]   Fax:  [phone]  Wound Care Therapy/ Reassessment  Patient Details  Name: Candace [name] MRN: [id number] Date of Birth: 1922-10-05 Referring Provider: Particia Nearing   Encounter Date: 01/10/2016      PT End of Session - 01/10/16 1239    Visit Number 21   Number of Visits 25   Date for PT Re-Evaluation 12/28/15   Authorization Type [employer] medicare: g code and recert completed on visit 11   Authorization - Visit Number 21   Authorization - Number of Visits 31   PT Start Time 6503   PT Stop Time 1200   PT Time Calculation (min) 35 min   Activity Tolerance Patient tolerated treatment well;No increased pain   Behavior During Therapy WFL for tasks assessed/performed      Past Medical History:  Diagnosis Date  . Aortic stenosis   . Atrial fibrillation ([place])   . DVT (deep venous thrombosis) ([place])   . Essential hypertension   . Glaucoma   . History of skin cancer 2016  . Hypercholesterolemia   . PE (pulmonary embolism)     Past Surgical History:  Procedure Laterality Date  . CATARACT EXTRACTION    . CHOLECYSTECTOMY    . KYPHOPLASTY    . SKIN CANCER EXCISION Left 2016   neck    There were no vitals filed for this visit.                  Wound Therapy - 01/10/16 1221    Subjective Pt with Rt LE dressings removed and Lt LE still intact.   Patient and Family Stated Goals Wounds to heal    Date of Onset 09/23/15   Prior Treatments peroxide to Lt wound and antibiotic.    Pain Assessment No/denies pain   Evaluation and Treatment Procedures Explained to Patient/Family Yes   Evaluation and Treatment Procedures agreed to   Wound Properties Date First Assessed: 11/08/15 Time First Assessed: 1130 Wound Type: Laceration Location: Leg Location Orientation: Right;Anterior Wound Description (Comments): Rt anterior shin, hit on recliner lever at home  Present on Admission: No   Dressing Type Gauze (Comment);Compression wrap   Dressing Changed Changed   Dressing Status Intact;Old drainage   Dressing Change Frequency PRN   Site / Wound Assessment Painful;Red;Yellow   % Wound base Red or Granulating 60%   % Wound base Yellow/Fibrinous Exudate 40%   Peri-wound Assessment Intact   Wound Length (cm) 1.8 cm  was 2.1cm on 12/14   Wound Width (cm) 1.5 cm  was 2 cm on 12/14   Margins Attached edges (approximated)   Closure None   Drainage Amount Minimal   Drainage Description Serous   Treatment Hydrotherapy (Pulse lavage);Debridement (Selective)   Wound Properties Date First Assessed: 10/24/15 Time First Assessed: 1530 Wound Type: Puncture Location: Toe (Comment  which one) Location Orientation: Right Wound Description (Comments): plantar aspect of great toe    Dressing Type Silver hydrofiber;Gauze (Comment)   Dressing Status Intact   Dressing Change Frequency PRN   Site / Wound Assessment Red;Yellow   % Wound base Red or Granulating 90%   % Wound base Yellow/Fibrinous Exudate 10%   Peri-wound Assessment Intact;Pink   Wound Length (cm) 0.8 cm  was 0.6cm on 12/14   Wound Width (cm) 1 cm  was 1.2 cm on 12/14   Wound Depth (cm) 0.2 cm  was 0.3 cm on 12/14  Margins Unattached edges (unapproximated)   Drainage Amount Minimal   Drainage Description Purulent;No odor   Treatment Hydrotherapy (Pulse lavage);Debridement (Selective)   Wound Properties Date First Assessed: 10/24/15 Time First Assessed: 1615 Wound Type: Other (Comment) Location: Leg Location Orientation: Left;Posterior;Lower Wound Description (Comments): dry Present on Admission: Yes   Dressing Type Gauze (Comment);Compression wrap  Silverhydrofiber   Dressing Status Old drainage   Dressing Change Frequency PRN   Site / Wound Assessment Pink;Yellow;Painful;Granulation tissue;Dusky   % Wound base Red or Granulating 30%   % Wound base Yellow/Fibrinous Exudate 30%   % Wound  base Black/Eschar 40%   Peri-wound Assessment Pink;Intact   Wound Length (cm) 2.4 cm  was 2.4 on 12/14   Wound Width (cm) 2.1 cm  was 2.7 cm on 12/14   Margins Unattached edges (unapproximated)   Drainage Amount Minimal   Drainage Description Serosanguineous   Treatment Hydrotherapy (Pulse lavage);Debridement (Selective)   Wound Properties Date First Assessed: 01/10/16 Time First Assessed: 1140 Wound Type: Other (Comment) , unknown cause  Location: Ankle Location Orientation: Right;Medial   Dressing Type None   Dressing Changed New   Dressing Status Intact   Dressing Change Frequency PRN   Site / Wound Assessment Clean;Red;Yellow   % Wound base Red or Granulating 0%   % Wound base Yellow/Fibrinous Exudate 100%   Peri-wound Assessment Erythema (blanchable)   Wound Length (cm) 0.6 cm   Wound Width (cm) 0.6 cm   Wound Depth (cm) 0 cm   Drainage Amount Minimal   Drainage Description Serous   Treatment Hydrotherapy (Pulse lavage);Debridement (Selective)   Pulsed lavage therapy - wound location to all wounds    Pulsed Lavage with Suction (psi) 4 psi   Pulsed Lavage with Suction - Normal Saline Used 1000 mL   Pulsed Lavage Tip Tip with splash shield   Selective Debridement - Location to Rt anterior and Lt posterior wounds .    Selective Debridement - Tools Used Forceps;Scalpel   Selective Debridement - Tissue Removed slough    Wound Therapy - Clinical Statement Pt walked back to wound room this session with HHA (Pt is usually in wheelchair).  Pt with noted bandage removal on Rt LE and new small wound (as noted above) on Rt medial ankle.  Pt is unsure how she got this wound, however does not appear to be an abraision or scrape.  Cleansed all wounds and dressed as below.  Pt tolerated session well.  Wounds remeasured with noted reduction in all sites.  Pt will continue to require skilled wound care until all wounds are healed.    Wound Therapy - Functional Problem List difficult walking     Factors Delaying/Impairing Wound Healing Altered sensation;Infection - systemic/local;Multiple medical problems;Polypharmacy;Vascular compromise   Hydrotherapy Plan Debridement;Dressing change;Patient/family education;Pulsatile lavage with suction   Wound Therapy - Frequency Other (comment)  2x/ wk x 6 weeks   Wound Plan Pt to recieve pulse lavage, debridement and dressing change to all wounds on bilateral LE's.   Dressing  Rt great toe: silverhydrofiber packed into wound followed by 2x2 and 2" kling;   Dressing B LE: anterior and Lt posterior  medihoney colloid followed by 4x4; kerlix, coban and #6 netting   Dressing Rt medial ankle:  silver hydrofiber                   PT Short Term Goals - 11/30/15 1457      PT SHORT TERM GOAL #1   Title Pt Lt LE to  have 50% granulation to decrease risk of infection   Time 8  time updated due to pt not coming for rx for the past 2 weeks and hospitalizations    Period Weeks   Status On-going     PT SHORT TERM GOAL #2   Title Pt and familty to be able to verbalize the signs and symptoms of infection and the importance of seeking medical help   Time 1   Period Weeks   Status Achieved     PT SHORT TERM GOAL #3   Title Pt Rt toe wound to no longer have purulent drainage to demonstrate decreased infection    Time 3   Period Weeks   Status Achieved     PT SHORT TERM GOAL #4   Title Pt pain in both legs to be no greater than a 5/10 to allow pt to ambulate in comfort in her home   Baseline --   Time 3   Period Weeks   Status Achieved           PT Long Term Goals - 11/30/15 1502      PT LONG TERM GOAL #1   Title Lt LE wound to be healed    Time 12  time revised due to pt being in hospital and missing appointments then pt not being seen for 2 weeks due to daughter attempting to find facility for pt to live    Period Weeks   Status Not Met     PT LONG TERM GOAL #2   Title Rt great toe wound to have filled in depth to no greater  than .3 and minimal undermining to allow family to be comfortable with dressing wound    Baseline 11/30/2015 depth is now .3 but wound continues to have significant undermining.    Time 12   Period Weeks   Status Partially Met      Current:[id number] CK Goal:  [phone] CJ       Patient will benefit from skilled therapeutic intervention in order to improve the following deficits and impairments:     Visit Diagnosis: Difficulty in walking, not elsewhere classified  Puncture wound of right great toe with foreign body without damage to nail, subsequent encounter  Non-pressure chronic ulcer of left calf with necrosis of muscle (H[facility])  Pain in right lower leg  Pain in left lower leg     Problem List Patient Active Problem List   Diagnosis Date Noted  . Pain of left hand 11/08/2015  . Hand strain, left, initial encounter 11/08/2015  . Cellulitis 10/25/2015  . Cellulitis of right foot 10/24/2015  . Hypokalemia 10/24/2015  . Bradycardia 10/24/2015  . Cellulitis of foot, right 10/24/2015  . Wound infection   . Aortic atherosclerosis ([place]) 09/01/2015  . Sick sinus syndrome ([place]) 08/11/2015  . Non-sustained ventricular tachycardia ([place]) 08/11/2015  . Anticoagulated on Coumadin 08/11/2015  . CKD (chronic kidney disease) stage 3, GFR 30-59 ml/min 08/09/2015  . Elevated troponin 08/08/2015  . Renal insufficiency 08/08/2015  . Severe aortic stenosis 08/05/2015  . Hyperlipidemia 06/10/2015  . Tobacco use disorder 01/28/2014  . Osteoporosis with pathological fracture 08/13/2012  . DVT (deep venous thrombosis), right 05/13/2012  . HTN (hypertension) 05/13/2012  . Long term current use of anticoagulant therapy 05/13/2012  . Atrial fibrillation ([place]) 04/15/2012    Teena Irani, PTA/CLT [place], PT CLT [phone] 01/10/2016, 12:41 PM  [place], [place], [address]  Phone: [phone]   Fax:   [phone]  Name: HALEE GLYNN MRN: [id number] Date of Birth: [birth year]

## 2016-01-10 NOTE — Patient Instructions (Signed)
Fall Prevention in the Home Falls can cause injuries and can affect people from all age groups. There are many simple things that you can do to make your home safe and to help prevent falls. What can I do on the outside of my home?  Regularly repair the edges of walkways and driveways and fix any cracks.  Remove high doorway thresholds.  Trim any shrubbery on the main path into your home.  Use bright outdoor lighting.  Clear walkways of debris and clutter, including tools and rocks.  Regularly check that handrails are securely fastened and in good repair. Both sides of any steps should have handrails.  Install guardrails along the edges of any raised decks or porches.  Have leaves, snow, and ice cleared regularly.  Use sand or salt on walkways during winter months.  In the garage, clean up any spills right away, including grease or oil spills. What can I do in the bathroom?  Use night lights.  Install grab bars by the toilet and in the tub and shower. Do not use towel bars as grab bars.  Use non-skid mats or decals on the floor of the tub or shower.  If you need to sit down while you are in the shower, use a plastic, non-slip stool.  Keep the floor dry. Immediately clean up any water that spills on the floor.  Remove soap buildup in the tub or shower on a regular basis.  Attach bath mats securely with double-sided non-slip rug tape.  Remove throw rugs and other tripping hazards from the floor. What can I do in the bedroom?  Use night lights.  Make sure that a bedside light is easy to reach.  Do not use oversized bedding that drapes onto the floor.  Have a firm chair that has side arms to use for getting dressed.  Remove throw rugs and other tripping hazards from the floor. What can I do in the kitchen?  Clean up any spills right away.  Avoid walking on wet floors.  Place frequently used items in easy-to-reach places.  If you need to reach for something above  you, use a sturdy step stool that has a grab bar.  Keep electrical cables out of the way.  Do not use floor polish or wax that makes floors slippery. If you have to use wax, make sure that it is non-skid floor wax.  Remove throw rugs and other tripping hazards from the floor. What can I do in the stairways?  Do not leave any items on the stairs.  Make sure that there are handrails on both sides of the stairs. Fix handrails that are broken or loose. Make sure that handrails are as long as the stairways.  Check any carpeting to make sure that it is firmly attached to the stairs. Fix any carpet that is loose or worn.  Avoid having throw rugs at the top or bottom of stairways, or secure the rugs with carpet tape to prevent them from moving.  Make sure that you have a light switch at the top of the stairs and the bottom of the stairs. If you do not have them, have them installed. What are some other fall prevention tips?  Wear closed-toe shoes that fit well and support your feet. Wear shoes that have rubber soles or low heels.  When you use a stepladder, make sure that it is completely opened and that the sides are firmly locked. Have someone hold the ladder while you are using   it. Do not climb a closed stepladder.  Add color or contrast paint or tape to grab bars and handrails in your home. Place contrasting color strips on the first and last steps.  Use mobility aids as needed, such as canes, walkers, scooters, and crutches.  Turn on lights if it is dark. Replace any light bulbs that burn out.  Set up furniture so that there are clear paths. Keep the furniture in the same spot.  Fix any uneven floor surfaces.  Choose a carpet design that does not hide the edge of steps of a stairway.  Be aware of any and all pets.  Review your medicines with your healthcare provider. Some medicines can cause dizziness or changes in blood pressure, which increase your risk of falling. Talk with  your health care provider about other ways that you can decrease your risk of falls. This may include working with a physical therapist or trainer to improve your strength, balance, and endurance. This information is not intended to replace advice given to you by your health care provider. Make sure you discuss any questions you have with your health care provider. Document Released: 12/15/2001 Document Revised: 05/24/2015 Document Reviewed: 01/29/2014 Elsevier Interactive Patient Education  2017 [employer]  

## 2016-01-10 NOTE — Progress Notes (Signed)
   Subjective:    Patient ID: Candace [name], female    DOB: October 13, 1922, 83 y.o.   MRN: [id number]  HPI  Patient brought in by daughter with patient c/o back pain- has been hurting for awhile- She has been seeing Dr. Cay Schillings and she was suppose to have cement injected in back. Had labs drawn and they have lost her blood work. She I waiting to hear from them to see what they want her to do. Patient rates her pain 9/10 currently. Has ot had any pain meds in awhile.   Review of Systems  Constitutional: Negative.   HENT: Negative.   Respiratory: Negative.   Cardiovascular: Negative.   Genitourinary: Negative.   Neurological: Negative.   Psychiatric/Behavioral: Negative.   All other systems reviewed and are negative.      Objective:   Physical Exam  Constitutional: She is oriented to person, place, and time. She appears well-developed and well-nourished.  Musculoskeletal:  Gait is slow and steady- back is hunched over (-) SLR bil Motor strength and sensation distally intact  Neurological: She is alert and oriented to person, place, and time.  Skin: Skin is warm.  Psychiatric: She has a normal mood and affect. Her behavior is normal. Judgment and thought content normal.   BP (!) 148/76   Pulse 78   Temp (!) 96.8 F (36 C) (Oral)   Ht 4\' 3"  (1.295 m)   Wt 88 lb (39.9 kg)   BMI 23.79 kg/m         Assessment & Plan:   1. Chronic bilateral low back pain without sciatica    Meds ordered this encounter  Medications  . HYDROcodone-acetaminophen (NORCO/VICODIN) 5-325 MG tablet    Sig: Take 1 tablet by mouth every 4 (four) hours as needed.    Dispense:  40 tablet    Refill:  0    Order Specific Question:   Supervising Provider    Answer:   VINCENT, CAROL L [[id number]]   Moist heat Rest  fall precautions RTO prn  [name] Done, FNP

## 2016-01-10 NOTE — Telephone Encounter (Signed)
Patient has been seen since phone call 

## 2016-01-11 ENCOUNTER — Telehealth: Payer: Self-pay

## 2016-01-11 NOTE — Telephone Encounter (Signed)
Ok to hold warfarin for 5 days.  Recommend checking INR either day prior to morning of procedure.

## 2016-01-13 ENCOUNTER — Ambulatory Visit (HOSPITAL_COMMUNITY): Payer: Medicare Other

## 2016-01-13 DIAGNOSIS — M79661 Pain in right lower leg: Secondary | ICD-10-CM | POA: Diagnosis not present

## 2016-01-13 DIAGNOSIS — S91141D Puncture wound with foreign body of right great toe without damage to nail, subsequent encounter: Secondary | ICD-10-CM

## 2016-01-13 DIAGNOSIS — L97223 Non-pressure chronic ulcer of left calf with necrosis of muscle: Secondary | ICD-10-CM

## 2016-01-13 DIAGNOSIS — R262 Difficulty in walking, not elsewhere classified: Secondary | ICD-10-CM | POA: Diagnosis not present

## 2016-01-13 DIAGNOSIS — M79662 Pain in left lower leg: Secondary | ICD-10-CM | POA: Diagnosis not present

## 2016-01-13 NOTE — Therapy (Signed)
[place], [place], [address] Phone: [phone]   Fax:  [phone]  Physical Therapy Treatment  Patient Details  Name: Candace [name] MRN: [id number] Date of Birth: 1923-01-02 Referring Provider: Particia Nearing   Encounter Date: 01/13/2016      PT End of Session - 01/13/16 1416    Visit Number 22   Number of Visits 25   Authorization Type [employer] medicare: g code and recert completed on visit 11   Authorization - Visit Number 22   Authorization - Number of Visits 31   PT Start Time 4163   PT Stop Time 1350   PT Time Calculation (min) 47 min   Activity Tolerance Patient tolerated treatment well;No increased pain   Behavior During Therapy WFL for tasks assessed/performed      Past Medical History:  Diagnosis Date  . Aortic stenosis   . Atrial fibrillation ([place])   . DVT (deep venous thrombosis) ([place])   . Essential hypertension   . Glaucoma   . History of skin cancer 2016  . Hypercholesterolemia   . PE (pulmonary embolism)     Past Surgical History:  Procedure Laterality Date  . CATARACT EXTRACTION    . CHOLECYSTECTOMY    . KYPHOPLASTY    . SKIN CANCER EXCISION Left 2016   neck    There were no vitals filed for this visit.      Subjective Assessment - 01/13/16 1406    Subjective Pt arrived with BLE dressings intact, reports she was able to tolerate dressings becuase they were not too tight.  Current pain scale 6-7/10 to Lt LE                       Wound Therapy - 01/13/16 1407    Subjective Pt arrived with BLE dressings intact, reports she was able to tolerate dressings becuase they were not too tight.  Current pain scale 6-7/10 to Lt LE   Patient and Family Stated Goals Wounds to heal    Date of Onset 09/23/15   Prior Treatments peroxide to Lt wound and antibiotic.    Pain Assessment 0-10   Pain Score 6    Pain Type Chronic pain   Pain Location Leg   Pain Orientation Left    Pain Descriptors / Indicators Aching;Burning;Sore  Burning with medihoney application   Pain Onset On-going   Patients Stated Pain Goal 0   Pain Intervention(s) Emotional support   Multiple Pain Sites No   Evaluation and Treatment Procedures Explained to Patient/Family Yes   Evaluation and Treatment Procedures agreed to   Wound Properties Date First Assessed: 01/10/16 Time First Assessed: 1140 Wound Type: Other (Comment) , unknown cause  Location: Ankle Location Orientation: Right;Medial   Dressing Type Silver hydrofiber;Gauze (Comment);Compression wrap  silverhydrofiber, gauze, foam and coban   Dressing Changed Changed   Dressing Status Intact   Dressing Change Frequency PRN   Site / Wound Assessment Clean;Red;Yellow   % Wound base Red or Granulating 0%   % Wound base Yellow/Fibrinous Exudate 100%   Drainage Amount Minimal   Drainage Description Serous   Treatment Hydrotherapy (Pulse lavage);Cleansed;Debridement (Selective)   Wound Properties Date First Assessed: 11/08/15 Time First Assessed: 1130 Wound Type: Laceration Location: Leg Location Orientation: Right;Anterior Wound Description (Comments): Rt anterior shin, hit on recliner lever at home Present on Admission: No   Dressing Type Gauze (Comment);Compression wrap  medihoney, gauze, foam and coban   Dressing  Changed Changed   Dressing Status Intact;Old drainage   Dressing Change Frequency PRN   Site / Wound Assessment Painful;Red;Yellow   % Wound base Red or Granulating 60%   % Wound base Yellow/Fibrinous Exudate 40%   Peri-wound Assessment Intact   Margins Attached edges (approximated)   Closure None   Drainage Amount Minimal   Drainage Description Serous   Treatment Hydrotherapy (Pulse lavage);Cleansed;Debridement (Selective)   Wound Properties Date First Assessed: 10/24/15 Time First Assessed: 1530 Wound Type: Puncture Location: Toe (Comment  which one) Location Orientation: Right Wound Description (Comments): plantar  aspect of great toe    Dressing Type Silver hydrofiber;Gauze (Comment)   Dressing Status Intact   Dressing Change Frequency PRN   Site / Wound Assessment Red;Yellow   % Wound base Red or Granulating 90%   % Wound base Yellow/Fibrinous Exudate 10%   Margins Unattached edges (unapproximated)   Drainage Amount Minimal   Drainage Description Purulent;No odor   Treatment Hydrotherapy (Pulse lavage);Cleansed;Debridement (Selective)   Wound Properties Date First Assessed: 10/24/15 Time First Assessed: 1615 Wound Type: Other (Comment) Location: Leg Location Orientation: Left;Posterior;Lower Wound Description (Comments): dry Present on Admission: Yes   Dressing Type Gauze (Comment);Compression wrap  medihoney, gauze, foam and coban   Dressing Changed Changed   Dressing Status Old drainage   Dressing Change Frequency PRN   Site / Wound Assessment Pink;Yellow;Painful;Granulation tissue;Dusky   % Wound base Red or Granulating 40%   % Wound base Yellow/Fibrinous Exudate 60%   % Wound base Black/Eschar 0%   Margins Unattached edges (unapproximated)   Closure None   Drainage Amount Minimal   Drainage Description Serosanguineous   Treatment Hydrotherapy (Pulse lavage);Cleansed;Debridement (Selective)   Pulsed lavage therapy - wound location to all wounds    Pulsed Lavage with Suction (psi) 4 psi   Pulsed Lavage with Suction - Normal Saline Used 1000 mL   Pulsed Lavage Tip Tip with splash shield   Selective Debridement - Location to Rt ankle and anterior shin and Lt posterior wounds .    Selective Debridement - Tools Used Forceps;Scalpel   Selective Debridement - Tissue Removed slough    Wound Therapy - Clinical Statement Pt arrived with dressings intact with noted vast improvements with edema Lt LE.  PLS complete to all wounds for cleaning and selective debridement for removal of slough to promote healing per pt. tolerance.  Continued wiht silverhydrofiber and medihoney dressings as appropriate with  compression garments.  pt reports pain reduced at EOS.     Wound Therapy - Functional Problem List difficult walking    Factors Delaying/Impairing Wound Healing Altered sensation;Infection - systemic/local;Multiple medical problems;Polypharmacy;Vascular compromise   Hydrotherapy Plan Debridement;Dressing change;Patient/family education;Pulsatile lavage with suction   Wound Therapy - Frequency Other (comment)  2x/week for 6 weeks   Wound Plan Pt to recieve pulse lavage, debridement and dressing change to all wounds on bilateral LE's.   Dressing  Rt great toe: silverhydrofiber packed into wound followed by 2x2 and 2" kling;   Dressing B LE: anterior and Lt posterior  medihoney colloid followed by 4x4; kerlix, coban and #6 netting   Dressing Rt medial shin: silverhydrofiber, gauze, foam, coban and netting                    PT Short Term Goals - 11/30/15 1457      PT SHORT TERM GOAL #1   Title Pt Lt LE to have 50% granulation to decrease risk of infection   Time 8  time  updated due to pt not coming for rx for the past 2 weeks and hospitalizations    Period Weeks   Status On-going     PT SHORT TERM GOAL #2   Title Pt and familty to be able to verbalize the signs and symptoms of infection and the importance of seeking medical help   Time 1   Period Weeks   Status Achieved     PT SHORT TERM GOAL #3   Title Pt Rt toe wound to no longer have purulent drainage to demonstrate decreased infection    Time 3   Period Weeks   Status Achieved     PT SHORT TERM GOAL #4   Title Pt pain in both legs to be no greater than a 5/10 to allow pt to ambulate in comfort in her home   Baseline --   Time 3   Period Weeks   Status Achieved           PT Long Term Goals - 11/30/15 1502      PT LONG TERM GOAL #1   Title Lt LE wound to be healed    Time 12  time revised due to pt being in hospital and missing appointments then pt not being seen for 2 weeks due to daughter attempting to  find facility for pt to live    Period Weeks   Status Not Met     PT LONG TERM GOAL #2   Title Rt great toe wound to have filled in depth to no greater than .3 and minimal undermining to allow family to be comfortable with dressing wound    Baseline 11/30/2015 depth is now .3 but wound continues to have significant undermining.    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 Pain of left hand 11/08/2015  . Hand strain, left, initial encounter 11/08/2015  . Cellulitis 10/25/2015  . Cellulitis of right foot 10/24/2015  . Hypokalemia 10/24/2015  . Bradycardia 10/24/2015  . Cellulitis of foot, right 10/24/2015  . Wound infection   . Aortic atherosclerosis ([place]) 09/01/2015  . Sick sinus syndrome ([place]) 08/11/2015  . Non-sustained ventricular tachycardia ([place]) 08/11/2015  . Anticoagulated on Coumadin 08/11/2015  . CKD (chronic kidney disease) stage 3, GFR 30-59 ml/min 08/09/2015  . Elevated troponin 08/08/2015  . Renal insufficiency 08/08/2015  . Severe aortic stenosis 08/05/2015  . Hyperlipidemia 06/10/2015  . Tobacco use disorder 01/28/2014  . Osteoporosis with pathological fracture 08/13/2012  . DVT (deep venous thrombosis), right 05/13/2012  . HTN (hypertension) 05/13/2012  . Long term current use of anticoagulant therapy 05/13/2012  . Atrial fibrillation [facility]) 04/15/2012   Ihor Austin, [place]; [place]  Aldona Lento 01/13/2016, 2:22 PM  [facility] [address] [place], [place], [address] Phone: [phone]   Fax:  [phone]  Name: Candace [name] MRN: [id number] Date of Birth: [birth year]

## 2016-01-16 ENCOUNTER — Telehealth (HOSPITAL_COMMUNITY): Payer: Self-pay

## 2016-01-16 NOTE — Telephone Encounter (Signed)
Spoke to [place] and informed her that pt had no new fx per Dr. Estanislado Pandy. We would not need to schedule a procedure. Angela Nevin stated that she would let pt know and agreed with plan. She asked that I call Daivonna from [facility] and let them know also. I told her that I would. AW

## 2016-01-17 ENCOUNTER — Ambulatory Visit (HOSPITAL_COMMUNITY): Payer: Medicare Other | Admitting: Physical Therapy

## 2016-01-17 DIAGNOSIS — M79662 Pain in left lower leg: Secondary | ICD-10-CM | POA: Diagnosis not present

## 2016-01-17 DIAGNOSIS — L97223 Non-pressure chronic ulcer of left calf with necrosis of muscle: Secondary | ICD-10-CM

## 2016-01-17 DIAGNOSIS — R262 Difficulty in walking, not elsewhere classified: Secondary | ICD-10-CM | POA: Diagnosis not present

## 2016-01-17 DIAGNOSIS — S91141D Puncture wound with foreign body of right great toe without damage to nail, subsequent encounter: Secondary | ICD-10-CM | POA: Diagnosis not present

## 2016-01-17 DIAGNOSIS — M79661 Pain in right lower leg: Secondary | ICD-10-CM

## 2016-01-17 NOTE — Therapy (Signed)
[place], [place], [address] Phone: [phone]   Fax:  [phone]  Wound Care Therapy  Patient Details  Name: Candace [name] MRN: [id number] Date of Birth: 12-27-1922 Referring Provider: Particia Nearing   Encounter Date: 01/17/2016      PT End of Session - 01/17/16 1559    Visit Number 23   Number of Visits 25   Authorization Type [employer] medicare: g code and recert completed on visit 11   Authorization - Visit Number 23   Authorization - Number of Visits 31   PT Start Time 1400   PT Stop Time 1430   PT Time Calculation (min) 30 min   Activity Tolerance Patient tolerated treatment well;No increased pain   Behavior During Therapy WFL for tasks assessed/performed      Past Medical History:  Diagnosis Date  . Aortic stenosis   . Atrial fibrillation ([place])   . DVT (deep venous thrombosis) ([place])   . Essential hypertension   . Glaucoma   . History of skin cancer 2016  . Hypercholesterolemia   . PE (pulmonary embolism)     Past Surgical History:  Procedure Laterality Date  . CATARACT EXTRACTION    . CHOLECYSTECTOMY    . KYPHOPLASTY    . SKIN CANCER EXCISION Left 2016   neck    There were no vitals filed for this visit.                  Wound Therapy - 01/17/16 1552    Subjective Both dressings with coban removed and bunched up.  Dressing completely off of Rt LE with wound completely dried out.    Patient and Family Stated Goals Wounds to heal    Date of Onset 09/23/15   Prior Treatments peroxide to Lt wound and antibiotic.    Pain Assessment No/denies pain   Evaluation and Treatment Procedures Explained to Patient/Family Yes   Evaluation and Treatment Procedures agreed to   Wound Properties Date First Assessed: 01/10/16 Time First Assessed: 1140 Wound Type: Other (Comment) , unknown cause  Location: Ankle Location Orientation: Right;Medial   Dressing Type None  silverhydrofiber, gauze,  foam and coban   Dressing Changed Changed   Dressing Status Intact   Dressing Change Frequency PRN   Site / Wound Assessment Clean;Red;Yellow   % Wound base Red or Granulating 100%   % Wound base Yellow/Fibrinous Exudate 0%   Drainage Amount None   Drainage Description Serous   Treatment Cleansed   Wound Properties Date First Assessed: 11/08/15 Time First Assessed: 1130 Wound Type: Laceration Location: Leg Location Orientation: Right;Anterior Wound Description (Comments): Rt anterior shin, hit on recliner lever at home Present on Admission: No   Dressing Type Gauze (Comment)  medihoney, gauze, foam and coban   Dressing Changed Changed   Dressing Status Intact;Old drainage   Dressing Change Frequency PRN   Site / Wound Assessment Painful;Red;Yellow   % Wound base Red or Granulating 60%   % Wound base Yellow/Fibrinous Exudate 40%   Peri-wound Assessment Intact   Margins Attached edges (approximated)   Closure None   Drainage Amount Minimal   Drainage Description Serous   Treatment Hydrotherapy (Pulse lavage);Debridement (Selective)   Wound Properties Date First Assessed: 10/24/15 Time First Assessed: 8032 Wound Type: Puncture Location: Toe (Comment  which one) Location Orientation: Right Wound Description (Comments): plantar aspect of great toe    Dressing Type Silver hydrofiber;Gauze (Comment)   Dressing Changed Changed  Dressing Status Intact   Dressing Change Frequency PRN   Site / Wound Assessment Red;Yellow   % Wound base Red or Granulating 90%   % Wound base Yellow/Fibrinous Exudate 10%   Margins Unattached edges (unapproximated)   Drainage Amount Minimal   Drainage Description Purulent;No odor   Treatment Hydrotherapy (Pulse lavage);Debridement (Selective)   Wound Properties Date First Assessed: 10/24/15 Time First Assessed: 1615 Wound Type: Other (Comment) Location: Leg Location Orientation: Left;Posterior;Lower Wound Description (Comments): dry Present on Admission: Yes    Dressing Type Gauze (Comment);Compression wrap  medihoney, gauze, foam and coban   Dressing Changed Changed   Dressing Status Old drainage   Dressing Change Frequency PRN   Site / Wound Assessment Pink;Yellow;Painful;Granulation tissue;Dusky   % Wound base Red or Granulating 40%   % Wound base Yellow/Fibrinous Exudate 60%   % Wound base Black/Eschar 0%   Margins Unattached edges (unapproximated)   Closure None   Drainage Amount Minimal   Drainage Description Serosanguineous   Treatment Hydrotherapy (Pulse lavage);Debridement (Selective)   Pulsed lavage therapy - wound location to all wounds    Pulsed Lavage with Suction (psi) 4 psi   Pulsed Lavage with Suction - Normal Saline Used 1000 mL   Pulsed Lavage Tip Tip with splash shield   Selective Debridement - Location Rt anterior shin, posterior Lt LE   Selective Debridement - Tools Used Forceps;Scalpel   Selective Debridement - Tissue Removed slough    Wound Therapy - Clinical Statement Dressings mostly removed from Rt LE and partially removed from LT LE this session.  Rt anterior shin wound completely scabbed over but with 80% granulation beneath when debrided.  Cleansed area well wtih pulse lavage prior to redressing.  Pt tolerated well.  Rt anterior ankle wound is now 100% granulated without drainage.   Wound Therapy - Functional Problem List difficult walking    Factors Delaying/Impairing Wound Healing Altered sensation;Infection - systemic/local;Multiple medical problems;Polypharmacy;Vascular compromise   Hydrotherapy Plan Debridement;Dressing change;Patient/family education;Pulsatile lavage with suction   Wound Therapy - Frequency Other (comment)  2x/week for 6 weeks   Wound Plan Pt to recieve pulse lavage, debridement and dressing change to all wounds on bilateral LE's.   Dressing  Rt great toe: silverhydrofiber packed into wound followed by 2x2 and 2" kling;   Dressing B LE: anterior and Lt posterior  medihoney colloid followed  by 4x4; kerlix, coban and #6 netting                   PT Short Term Goals - 11/30/15 1457      PT SHORT TERM GOAL #1   Title Pt Lt LE to have 50% granulation to decrease risk of infection   Time 8  time updated due to pt not coming for rx for the past 2 weeks and hospitalizations    Period Weeks   Status On-going     PT SHORT TERM GOAL #2   Title Pt and familty to be able to verbalize the signs and symptoms of infection and the importance of seeking medical help   Time 1   Period Weeks   Status Achieved     PT SHORT TERM GOAL #3   Title Pt Rt toe wound to no longer have purulent drainage to demonstrate decreased infection    Time 3   Period Weeks   Status Achieved     PT SHORT TERM GOAL #4   Title Pt pain in both legs to be no greater than a 5/10  to allow pt to ambulate in comfort in her home   Baseline --   Time 3   Period Weeks   Status Achieved           PT Long Term Goals - 11/30/15 1502      PT LONG TERM GOAL #1   Title Lt LE wound to be healed    Time 12  time revised due to pt being in hospital and missing appointments then pt not being seen for 2 weeks due to daughter attempting to find facility for pt to live    Period Weeks   Status Not Met     PT LONG TERM GOAL #2   Title Rt great toe wound to have filled in depth to no greater than .3 and minimal undermining to allow family to be comfortable with dressing wound    Baseline 11/30/2015 depth is now .3 but wound continues to have significant undermining.    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 Pain of left hand 11/08/2015  . Hand strain, left, initial encounter 11/08/2015  . Cellulitis 10/25/2015  . Cellulitis of right foot 10/24/2015  . Hypokalemia 10/24/2015  . Bradycardia 10/24/2015  . Cellulitis of foot, right 10/24/2015  . Wound infection   . Aortic atherosclerosis ([place]) 09/01/2015  . Sick sinus syndrome ([place]) 08/11/2015  . Non-sustained ventricular tachycardia ([place]) 08/11/2015  . Anticoagulated on Coumadin 08/11/2015  . CKD (chronic kidney disease) stage 3, GFR 30-59 ml/min 08/09/2015  . Elevated troponin 08/08/2015  . Renal insufficiency 08/08/2015  . Severe aortic stenosis 08/05/2015  . Hyperlipidemia 06/10/2015  . Tobacco use disorder 01/28/2014  . Osteoporosis with pathological fracture 08/13/2012  . DVT (deep venous thrombosis), right 05/13/2012  . HTN (hypertension) 05/13/2012  . Long term current use of anticoagulant therapy 05/13/2012  . Atrial fibrillation ([place]) 04/15/2012    Teena Irani, PTA/CLT [phone]  01/17/2016, 4:00 PM  [facility] [address] [place], [place], [address] Phone: [phone]   Fax:  [phone]  Name: GEORGIA DELSIGNORE MRN: [id number] Date of Birth: [birth year]

## 2016-01-20 ENCOUNTER — Ambulatory Visit (HOSPITAL_COMMUNITY): Payer: Medicare Other

## 2016-01-20 DIAGNOSIS — M79662 Pain in left lower leg: Secondary | ICD-10-CM | POA: Diagnosis not present

## 2016-01-20 DIAGNOSIS — S91141D Puncture wound with foreign body of right great toe without damage to nail, subsequent encounter: Secondary | ICD-10-CM | POA: Diagnosis not present

## 2016-01-20 DIAGNOSIS — R262 Difficulty in walking, not elsewhere classified: Secondary | ICD-10-CM | POA: Diagnosis not present

## 2016-01-20 DIAGNOSIS — L97223 Non-pressure chronic ulcer of left calf with necrosis of muscle: Secondary | ICD-10-CM

## 2016-01-20 DIAGNOSIS — M79661 Pain in right lower leg: Secondary | ICD-10-CM | POA: Diagnosis not present

## 2016-01-20 NOTE — Therapy (Signed)
[facility] [place], [place], [address] Phone: [phone]   Fax:  [phone]  Wound Care Therapy  Patient Details  Name: Candace [name] MRN: [id number] Date of Birth: 1922/11/18 Referring Provider: Particia Nearing   Encounter Date: 01/20/2016      PT End of Session - 01/20/16 1408    Visit Number 24   Number of Visits 25   Date for PT Re-Evaluation 12/28/15   Authorization Type U[employer] medicare: g code and recert completed on visit 11   Authorization - Visit Number 24   Authorization - Number of Visits 31   PT Start Time 1300   PT Stop Time 5364   PT Time Calculation (min) 43 min   Activity Tolerance Patient tolerated treatment well;No increased pain   Behavior During Therapy WFL for tasks assessed/performed      Past Medical History:  Diagnosis Date  . Aortic stenosis   . Atrial fibrillation ([facility])   . DVT (deep venous thrombosis) ([place])   . Essential hypertension   . Glaucoma   . History of skin cancer 2016  . Hypercholesterolemia   . PE (pulmonary embolism)     Past Surgical History:  Procedure Laterality Date  . CATARACT EXTRACTION    . CHOLECYSTECTOMY    . KYPHOPLASTY    . SKIN CANCER EXCISION Left 2016   neck    There were no vitals filed for this visit.       Subjective Assessment - 01/20/16 1348    Subjective Pt arrived with dressings intact, no reports of pain currently.   Currently in Pain? No/denies                   Wound Therapy - 01/20/16 1349    Subjective Pt arrived with dressings intact, no reports of pain currently.   Patient and Family Stated Goals Wounds to heal    Date of Onset 09/23/15   Prior Treatments peroxide to Lt wound and antibiotic.    Pain Assessment No/denies pain   Evaluation and Treatment Procedures Explained to Patient/Family Yes   Evaluation and Treatment Procedures agreed to   Wound Properties Date First Assessed: 01/10/16 Time First Assessed: 1140  Wound Type: Other (Comment) , unknown cause  Location: Ankle Location Orientation: Right;Medial   Dressing Type None;Gauze (Comment)   Dressing Changed Changed   Dressing Status Intact   Dressing Change Frequency PRN   Site / Wound Assessment Clean;Dry   % Wound base Red or Granulating 100%   % Wound base Yellow/Fibrinous Exudate 0%   Drainage Amount None   Treatment Cleansed   Wound Properties Date First Assessed: 11/08/15 Time First Assessed: 1130 Wound Type: Laceration Location: Leg Location Orientation: Right;Anterior Wound Description (Comments): Rt anterior shin, hit on recliner lever at home Present on Admission: No   Dressing Type Gauze (Comment)  medihoney, gauze and kerlix with netting   Dressing Changed Changed   Dressing Status Clean;Dry;Intact   Dressing Change Frequency PRN   Site / Wound Assessment Painful;Red;Yellow   % Wound base Red or Granulating 75%   % Wound base Yellow/Fibrinous Exudate 25%   % Wound base Other/Granulation Tissue (Comment) 0%   Peri-wound Assessment Intact   Wound Length (cm) 1.8 cm  was 1.8   Wound Width (cm) 1.2 cm  was 1.2   Wound Depth (cm) 0 cm  was 0   Margins Attached edges (approximated)   Closure None   Drainage Amount Scant  Drainage Description Serous   Treatment Hydrotherapy (Pulse lavage);Cleansed;Debridement (Selective)   Wound Properties Date First Assessed: 10/24/15 Time First Assessed: 1530 Wound Type: Puncture Location: Toe (Comment  which one) Location Orientation: Right Wound Description (Comments): plantar aspect of great toe    Dressing Type Silver hydrofiber;Gauze (Comment)  silver hydrofiber, 4x4, gauze and netting   Dressing Changed Changed   Dressing Status Intact   Dressing Change Frequency PRN   Site / Wound Assessment Red;Yellow   % Wound base Red or Granulating 95%   % Wound base Yellow/Fibrinous Exudate 5%   Peri-wound Assessment Intact;Pink   Wound Length (cm) 0.6 cm  was .8   Wound Width (cm) 0.6 cm   was 1.0   Wound Depth (cm) 0.2 cm  was .2   Undermining (cm) .2 at 1200  was 0.5   Margins Unattached edges (unapproximated)   Drainage Amount Minimal   Drainage Description Purulent;No odor   Treatment Hydrotherapy (Pulse lavage);Cleansed;Debridement (Selective)   Wound Properties Date First Assessed: 10/24/15 Time First Assessed: 1615 Wound Type: Other (Comment) Location: Leg Location Orientation: Left;Posterior;Lower Wound Description (Comments): dry Present on Admission: Yes   Dressing Type Gauze (Comment)  Medihoney, 4x4, kerlix and netting   Dressing Changed Changed   Dressing Status Old drainage   Dressing Change Frequency PRN   Site / Wound Assessment Pink;Yellow;Painful;Granulation tissue;Dusky   % Wound base Red or Granulating 50%   % Wound base Yellow/Fibrinous Exudate 50%   Peri-wound Assessment Pink;Intact   Wound Length (cm) 2.4 cm  was 2.4   Wound Width (cm) 2.1 cm  was 2.1   Wound Depth (cm) 0.2 cm   Margins Unattached edges (unapproximated)   Closure None   Drainage Amount Minimal   Drainage Description Serosanguineous   Treatment Hydrotherapy (Pulse lavage);Cleansed;Debridement (Selective)   Pulsed lavage therapy - wound location to all wounds    Pulsed Lavage with Suction (psi) 4 psi   Pulsed Lavage with Suction - Normal Saline Used 1000 mL   Pulsed Lavage Tip Tip with splash shield   Selective Debridement - Location Rt anterior shin, posterior Lt LE, Rt great toe   Selective Debridement - Tools Used Forceps;Scalpel   Selective Debridement - Tissue Removed slough    Wound Therapy - Clinical Statement Pt arrived with kerlix and netting intact, no reports of pain at entrance.  Overall wounds are improving with decrease in overall size and increased granulation tissue following debridement.  Pt reports pain on Lt posterior calf during PLS though able to tolerate and no reports of pain following.  No edema present today, no coban applied.  Reinforced importance of  leaving dressings intact until next session, pt stated understanding.     Wound Therapy - Functional Problem List difficult walking    Factors Delaying/Impairing Wound Healing Altered sensation;Infection - systemic/local;Multiple medical problems;Polypharmacy;Vascular compromise   Hydrotherapy Plan Debridement;Dressing change;Patient/family education;Pulsatile lavage with suction   Wound Therapy - Frequency --  2x/week for 6 weeks   Wound Plan Pt to recieve pulse lavage, debridement and dressing change to all wounds on bilateral LE's.   Dressing  Rt great toe: silverhydrofiber packed into wound followed by 2x2 and 2" kling;   Dressing Rt anterior and Lt posterior  medihoney colloid followed by 4x4; kerlix and #6 netting                   PT Short Term Goals - 11/30/15 1457      PT SHORT TERM GOAL #1  Title Pt Lt LE to have 50% granulation to decrease risk of infection   Time 8  time updated due to pt not coming for rx for the past 2 weeks and hospitalizations    Period Weeks   Status On-going     PT SHORT TERM GOAL #2   Title Pt and familty to be able to verbalize the signs and symptoms of infection and the importance of seeking medical help   Time 1   Period Weeks   Status Achieved     PT SHORT TERM GOAL #3   Title Pt Rt toe wound to no longer have purulent drainage to demonstrate decreased infection    Time 3   Period Weeks   Status Achieved     PT SHORT TERM GOAL #4   Title Pt pain in both legs to be no greater than a 5/10 to allow pt to ambulate in comfort in her home   Baseline --   Time 3   Period Weeks   Status Achieved           PT Long Term Goals - 11/30/15 1502      PT LONG TERM GOAL #1   Title Lt LE wound to be healed    Time 12  time revised due to pt being in hospital and missing appointments then pt not being seen for 2 weeks due to daughter attempting to find facility for pt to live    Period Weeks   Status Not Met     PT LONG TERM GOAL  #2   Title Rt great toe wound to have filled in depth to no greater than .3 and minimal undermining to allow family to be comfortable with dressing wound    Baseline 11/30/2015 depth is now .3 but wound continues to have significant undermining.    Time 12   Period Weeks   Status Partially Met             Patient will benefit from skilled therapeutic intervention in order to improve the following deficits and impairments:     Visit Diagnosis: Difficulty in walking, not elsewhere classified  Puncture wound of right great toe with foreign body without damage to nail, subsequent encounter  Non-pressure chronic ulcer of left calf with necrosis of muscle ([place])     Problem List Patient Active Problem List   Diagnosis Date Noted  . Pain of left hand 11/08/2015  . Hand strain, left, initial encounter 11/08/2015  . Cellulitis 10/25/2015  . Cellulitis of right foot 10/24/2015  . Hypokalemia 10/24/2015  . Bradycardia 10/24/2015  . Cellulitis of foot, right 10/24/2015  . Wound infection   . Aortic atherosclerosis ([place]) 09/01/2015  . Sick sinus syndrome ([place]) 08/11/2015  . Non-sustained ventricular tachycardia ([place]) 08/11/2015  . Anticoagulated on Coumadin 08/11/2015  . CKD (chronic kidney disease) stage 3, GFR 30-59 ml/min 08/09/2015  . Elevated troponin 08/08/2015  . Renal insufficiency 08/08/2015  . Severe aortic stenosis 08/05/2015  . Hyperlipidemia 06/10/2015  . Tobacco use disorder 01/28/2014  . Osteoporosis with pathological fracture 08/13/2012  . DVT (deep venous thrombosis), right 05/13/2012  . HTN (hypertension) 05/13/2012  . Long term current use of anticoagulant therapy 05/13/2012  . Atrial fibrillation [facility]) 04/15/2012   Ihor Austin, [place]; [place] Aldona Lento 01/20/2016, 2:10 PM  [facility] [address] [place], [place], [address] Phone: [phone]   Fax:  [phone]  Name: NAQUITA NAPPIER MRN: [id number]  Date of Birth: 1922/12/19

## 2016-01-23 ENCOUNTER — Ambulatory Visit (HOSPITAL_COMMUNITY): Payer: Medicare Other | Admitting: Physical Therapy

## 2016-01-23 DIAGNOSIS — L97223 Non-pressure chronic ulcer of left calf with necrosis of muscle: Secondary | ICD-10-CM

## 2016-01-23 DIAGNOSIS — M79661 Pain in right lower leg: Secondary | ICD-10-CM | POA: Diagnosis not present

## 2016-01-23 DIAGNOSIS — S91141D Puncture wound with foreign body of right great toe without damage to nail, subsequent encounter: Secondary | ICD-10-CM | POA: Diagnosis not present

## 2016-01-23 DIAGNOSIS — R262 Difficulty in walking, not elsewhere classified: Secondary | ICD-10-CM

## 2016-01-23 DIAGNOSIS — M79662 Pain in left lower leg: Secondary | ICD-10-CM

## 2016-01-23 NOTE — Therapy (Signed)
[place], [place], [address] Phone: [phone]   Fax:  [phone]  Wound Care Therapy  Patient Details  Name: Candace [name] MRN: [id number] Date of Birth: 18-Jan-1922 Referring Provider: Particia Nearing   Encounter Date: 01/23/2016      PT End of Session - 01/23/16 1403    Visit Number 25   Number of Visits 30   Date for PT Re-Evaluation 02/22/16   Authorization Type [employer] medicare: g code and recert completed on visit 01/10/1016   Authorization - Visit Number 24   Authorization - Number of Visits 31   PT Start Time 1304   PT Stop Time 1345   PT Time Calculation (min) 41 min   Activity Tolerance Patient tolerated treatment well;No increased pain   Behavior During Therapy WFL for tasks assessed/performed      Past Medical History:  Diagnosis Date  . Aortic stenosis   . Atrial fibrillation ([place])   . DVT (deep venous thrombosis) ([place])   . Essential hypertension   . Glaucoma   . History of skin cancer 2016  . Hypercholesterolemia   . PE (pulmonary embolism)     Past Surgical History:  Procedure Laterality Date  . CATARACT EXTRACTION    . CHOLECYSTECTOMY    . KYPHOPLASTY    . SKIN CANCER EXCISION Left 2016   neck    There were no vitals filed for this visit.                  Wound Therapy - 01/23/16 1352    Subjective Pt arrived with dressings intact, no reports of pain currently.   Patient and Family Stated Goals Wounds to heal    Date of Onset 09/23/15   Prior Treatments peroxide to Lt wound and antibiotic.    Pain Assessment 0-10   Pain Score 4    Pain Type Chronic pain   Pain Location Leg   Pain Orientation Left   Pain Descriptors / Indicators Aching   Pain Onset On-going   Pain Intervention(s) Emotional support   Multiple Pain Sites No   Evaluation and Treatment Procedures Explained to Patient/Family Yes   Evaluation and Treatment Procedures agreed to   Wound Properties  Date First Assessed: 01/10/16 Time First Assessed: 1140 Wound Type: Other (Comment) , unknown cause  Location: Ankle Location Orientation: Right;Medial Final Assessment Date: 01/23/16 Final Assessment Time: 9937   Wound Properties Date First Assessed: 11/08/15 Time First Assessed: 1130 Wound Type: Laceration Location: Leg Location Orientation: Right;Anterior Wound Description (Comments): Rt anterior shin, hit on recliner lever at home Present on Admission: No   Dressing Type Gauze (Comment)  medihoney, gauze and kerlix with netting   Dressing Changed Changed   Dressing Status Clean;Dry;Intact   Dressing Change Frequency PRN   Site / Wound Assessment Painful;Red;Yellow   % Wound base Red or Granulating 85%   % Wound base Yellow/Fibrinous Exudate 15%   % Wound base Other/Granulation Tissue (Comment) 0%   Peri-wound Assessment Intact   Wound Length (cm) 1.5 cm  was 1.8   Wound Width (cm) 1 cm  was 1.2   Wound Depth (cm) 0 cm   Margins Attached edges (approximated)   Closure None   Drainage Amount Scant   Drainage Description Serous   Treatment Cleansed;Debridement (Selective)   Wound Properties Date First Assessed: 10/24/15 Time First Assessed: 1530 Wound Type: Puncture Location: Toe (Comment  which one) Location Orientation: Right Wound Description (Comments): plantar  aspect of great toe    Dressing Type Silver hydrofiber;Gauze (Comment)  silver hydrofiber, 4x4, gauze and netting   Dressing Changed Changed   Dressing Status Intact   Dressing Change Frequency PRN   Site / Wound Assessment Red;Yellow   % Wound base Red or Granulating 95%   % Wound base Yellow/Fibrinous Exudate 5%   Peri-wound Assessment Intact;Pink   Wound Length (cm) 0.5 cm  was .6   Wound Width (cm) --  was .6   Wound Depth (cm) 0.2 cm   Margins Unattached edges (unapproximated)   Drainage Amount Minimal   Drainage Description Purulent;No odor   Treatment Cleansed;Debridement (Selective)   Wound Properties Date  First Assessed: 10/24/15 Time First Assessed: 1615 Wound Type: Other (Comment) Location: Leg Location Orientation: Left;Posterior;Lower Wound Description (Comments): dry Present on Admission: Yes   Dressing Type Gauze (Comment)  Medihoney, 4x4, kerlix and netting   Dressing Changed Changed   Dressing Status Old drainage   Dressing Change Frequency PRN   Site / Wound Assessment Pink;Yellow;Painful;Granulation tissue;Dusky   % Wound base Red or Granulating 70%  was 50%    % Wound base Yellow/Fibrinous Exudate 30%   Peri-wound Assessment Pink;Intact   Wound Length (cm) 2.4 cm  was 2.4   Wound Width (cm) 2 cm  was 2.0   Wound Depth (cm) 0.2 cm   Margins Unattached edges (unapproximated)   Closure None   Drainage Amount Minimal   Drainage Description Serosanguineous   Treatment Cleansed;Debridement (Selective)   Pulsed lavage therapy - wound location --   Pulsed Lavage with Suction (psi) --   Pulsed Lavage with Suction - Normal Saline Used --   Pulsed Lavage Tip --   Selective Debridement - Location Rt anterior shin, posterior Lt LE, Rt great toe   Selective Debridement - Tools Used Forceps   Selective Debridement - Tissue Removed slough    Wound Therapy - Clinical Statement Pt Rt dressing intact but Lt dressing in disarray.  Pt states that she has not been "messing" with her dressings.  Wounds appear clean enough to complete pulse lavage PRN .  Lt LE dressed with medihoney and profore lite in hopes that the dressing will stay on until pt returns.  Pt  medial wound on Rt ankle currently healed.  Will continue to see pt until wounds are no longer draining and able to be no greater than 1x1 cm.  Pt advanced age and dementia( pt removes dressing), are causing wounds to heal more slowly than anticipated.    Wound Therapy - Functional Problem List difficult walking    Factors Delaying/Impairing Wound Healing Altered sensation;Infection - systemic/local;Multiple medical  problems;Polypharmacy;Vascular compromise   Hydrotherapy Plan Debridement;Dressing change;Patient/family education;Pulsatile lavage with suction   Wound Therapy - Frequency --  2x/week for a total of 15 weeks)   Wound Plan Pt to recieve pulse lavage, debridement and dressing change to all wounds on bilateral LE's.   Dressing  Rt great toe: silverhydrofiber packed into wound followed by 2x2 and 2" kling;   Dressing Rt anterior and Lt posterior  medihoney colloid followed by 4x4; kerlix and #6 netting to Rt LE with profore lite placed on loosely to right LE in hopes that pt will keep her dressing on.                    PT Short Term Goals - 11/30/15 1457      PT SHORT TERM GOAL #1   Title Pt Lt LE to  have 50% granulation to decrease risk of infection   Time 8  time updated due to pt not coming for rx for the past 2 weeks and hospitalizations    Period Weeks   Status On-going     PT SHORT TERM GOAL #2   Title Pt and familty to be able to verbalize the signs and symptoms of infection and the importance of seeking medical help   Time 1   Period Weeks   Status Achieved     PT SHORT TERM GOAL #3   Title Pt Rt toe wound to no longer have purulent drainage to demonstrate decreased infection    Time 3   Period Weeks   Status Achieved     PT SHORT TERM GOAL #4   Title Pt pain in both legs to be no greater than a 5/10 to allow pt to ambulate in comfort in her home   Baseline --   Time 3   Period Weeks   Status Achieved           PT Long Term Goals - 11/30/15 1502      PT LONG TERM GOAL #1   Title Lt LE wound to be healed    Time 12  time revised due to pt being in hospital and missing appointments then pt not being seen for 2 weeks due to daughter attempting to find facility for pt to live    Period Weeks   Status Not Met     PT LONG TERM GOAL #2   Title Rt great toe wound to have filled in depth to no greater than .3 and minimal undermining to allow family to be  comfortable with dressing wound    Baseline 11/30/2015 depth is now .3 but wound continues to have significant undermining.    Time 12   Period Weeks   Status Partially Met               Plan - 01/23/16 1405    Clinical Impression Statement as above    Rehab Potential Good   PT Frequency 3x / week   PT Duration 6 weeks   PT Treatment/Interventions ADLs/Self Care Home Management;Patient/family education;Other (comment)  debridement and dressing change   PT Next Visit Plan Continue wound care twice a week for the next  6 weeks.    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 Pain of left hand 11/08/2015  . Hand strain, left, initial encounter 11/08/2015  . Cellulitis 10/25/2015  . Cellulitis of right foot 10/24/2015  . Hypokalemia 10/24/2015  . Bradycardia 10/24/2015  . Cellulitis of foot, right 10/24/2015  . Wound infection   . Aortic atherosclerosis ([place]) 09/01/2015  . Sick sinus syndrome ([place]) 08/11/2015  . Non-sustained ventricular tachycardia ([place]) 08/11/2015  . Anticoagulated on Coumadin 08/11/2015  . CKD (chronic kidney disease) stage 3, GFR 30-59 ml/min 08/09/2015  . Elevated troponin 08/08/2015  . Renal insufficiency 08/08/2015  .  Severe aortic stenosis 08/05/2015  . Hyperlipidemia 06/10/2015  . Tobacco use disorder 01/28/2014  . Osteoporosis with pathological fracture 08/13/2012  . DVT (deep venous thrombosis), right  05/13/2012  . HTN (hypertension) 05/13/2012  . Long term current use of anticoagulant therapy 05/13/2012  . Atrial fibrillation [facility]) 04/15/2012    Rayetta [name], PT CLT [id number] 01/23/2016, 2:06 PM  [employer] [address] [place], [place], [address] Phone: [phone]   Fax:  [phone]  Name: Candace [name] MRN: [id number] Date of Birth: 1922/04/03

## 2016-01-24 DIAGNOSIS — M549 Dorsalgia, unspecified: Secondary | ICD-10-CM | POA: Diagnosis not present

## 2016-01-24 DIAGNOSIS — S22080D Wedge compression fracture of T11-T12 vertebra, subsequent encounter for fracture with routine healing: Secondary | ICD-10-CM | POA: Diagnosis not present

## 2016-01-24 DIAGNOSIS — M545 Low back pain: Secondary | ICD-10-CM | POA: Diagnosis not present

## 2016-01-24 DIAGNOSIS — G8929 Other chronic pain: Secondary | ICD-10-CM | POA: Diagnosis not present

## 2016-01-26 ENCOUNTER — Ambulatory Visit (HOSPITAL_COMMUNITY): Payer: Self-pay | Admitting: Physical Therapy

## 2016-01-27 DIAGNOSIS — S22080A Wedge compression fracture of T11-T12 vertebra, initial encounter for closed fracture: Secondary | ICD-10-CM | POA: Diagnosis not present

## 2016-01-31 ENCOUNTER — Ambulatory Visit (HOSPITAL_COMMUNITY): Payer: Medicare Other | Admitting: Physical Therapy

## 2016-01-31 DIAGNOSIS — M79662 Pain in left lower leg: Secondary | ICD-10-CM

## 2016-01-31 DIAGNOSIS — S91141D Puncture wound with foreign body of right great toe without damage to nail, subsequent encounter: Secondary | ICD-10-CM | POA: Diagnosis not present

## 2016-01-31 DIAGNOSIS — L97223 Non-pressure chronic ulcer of left calf with necrosis of muscle: Secondary | ICD-10-CM | POA: Diagnosis not present

## 2016-01-31 DIAGNOSIS — H40033 Anatomical narrow angle, bilateral: Secondary | ICD-10-CM | POA: Diagnosis not present

## 2016-01-31 DIAGNOSIS — M79661 Pain in right lower leg: Secondary | ICD-10-CM

## 2016-01-31 DIAGNOSIS — R262 Difficulty in walking, not elsewhere classified: Secondary | ICD-10-CM

## 2016-01-31 DIAGNOSIS — H2513 Age-related nuclear cataract, bilateral: Secondary | ICD-10-CM | POA: Diagnosis not present

## 2016-01-31 NOTE — Therapy (Signed)
[place], [place], [address] Phone: [phone]   Fax:  [phone]  Wound Care Therapy  Patient Details  Name: Candace [name] MRN: [id number] Date of Birth: 08-Feb-1922 Referring Provider: Particia Nearing   Encounter Date: 01/31/2016      PT End of Session - 01/31/16 1402    Visit Number 26   Number of Visits 30   Date for PT Re-Evaluation 02/22/16   Authorization Type U[employer] medicare: g code and recert completed on visit 01/10/1016   Authorization - Visit Number 26   Authorization - Number of Visits 30   PT Start Time 1300   PT Stop Time 1345   PT Time Calculation (min) 45 min   Activity Tolerance Patient tolerated treatment well;No increased pain   Behavior During Therapy WFL for tasks assessed/performed      Past Medical History:  Diagnosis Date  . Aortic stenosis   . Atrial fibrillation ([place])   . DVT (deep venous thrombosis) ([place])   . Essential hypertension   . Glaucoma   . History of skin cancer 2016  . Hypercholesterolemia   . PE (pulmonary embolism)     Past Surgical History:  Procedure Laterality Date  . CATARACT EXTRACTION    . CHOLECYSTECTOMY    . KYPHOPLASTY    . SKIN CANCER EXCISION Left 2016   neck    There were no vitals filed for this visit.                  Wound Therapy - 01/31/16 1351    Subjective Pt states that her dressings were taken off by the MD and new dressings placed on the Lt leg.  Pt has no dressings on the right LE.   Patient and Family Stated Goals Wounds to heal    Date of Onset 09/23/15   Prior Treatments peroxide to Lt wound and antibiotic.    Pain Assessment 0-10   Pain Score 3    Pain Type Chronic pain   Pain Location Leg   Pain Orientation Left   Pain Descriptors / Indicators Aching   Pain Onset On-going   Pain Intervention(s) Emotional support   Multiple Pain Sites No   Evaluation and Treatment Procedures Explained to Patient/Family Yes    Evaluation and Treatment Procedures agreed to   Wound Properties Date First Assessed: 11/08/15 Time First Assessed: 1130 Wound Type: Laceration Location: Leg Location Orientation: Right;Anterior Wound Description (Comments): Rt anterior shin, hit on recliner lever at home Present on Admission: No   Dressing Type --  Pt comes in with no dressing on RT LE; used silver alginate.   Dressing Changed New   Dressing Status None   Dressing Change Frequency PRN   Site / Wound Assessment Painful;Red;Yellow   % Wound base Red or Granulating 85%   % Wound base Yellow/Fibrinous Exudate 15%   % Wound base Other/Granulation Tissue (Comment) 0%   Peri-wound Assessment Erythema (blanchable)  1.0 cm halo therefore dressing changed to silver alginate   Wound Length (cm) 1.5 cm  was 1.5   Wound Width (cm) 0.8 cm  was 1.0   Margins Attached edges (approximated)   Closure None   Drainage Amount Scant   Drainage Description Serous   Treatment Cleansed;Debridement (Selective)   Wound Properties Date First Assessed: 10/24/15 Time First Assessed: 1530 Wound Type: Puncture Location: Toe (Comment  which one) Location Orientation: Right Wound Description (Comments): plantar aspect of great toe  Dressing Type Silver hydrofiber;Gauze (Comment)  silver hydrofiber, 4x4, gauze and netting   Dressing Changed Changed   Dressing Status Intact   Dressing Change Frequency PRN   Site / Wound Assessment Red   % Wound base Red or Granulating 95%   % Wound base Yellow/Fibrinous Exudate 5%   Peri-wound Assessment Intact;Pink   Wound Length (cm) 0.4 cm  was .5   Wound Width (cm) 0.5 cm  was .6   Wound Depth (cm) 0.2 cm   Margins Unattached edges (unapproximated)   Drainage Amount Minimal   Drainage Description Purulent;No odor   Treatment Cleansed;Debridement (Selective)   Wound Properties Date First Assessed: 10/24/15 Time First Assessed: 1615 Wound Type: Other (Comment) Location: Leg Location Orientation:  Left;Posterior;Lower Wound Description (Comments): dry Present on Admission: Yes   Dressing Type Gauze (Comment)  Medihoney, 4x4, kerlix and netting   Dressing Changed Changed   Dressing Status Old drainage   Dressing Change Frequency PRN   Site / Wound Assessment Pink;Yellow;Painful;Granulation tissue;Dusky   % Wound base Red or Granulating 80%  was 50%    % Wound base Yellow/Fibrinous Exudate 20%   Peri-wound Assessment Pink;Intact   Wound Length (cm) 2.1 cm  was 2.4   Wound Width (cm) 2 cm   Wound Depth (cm) 0.15 cm  was .2   Margins Unattached edges (unapproximated)   Closure None   Drainage Amount Minimal   Drainage Description Serosanguineous   Treatment Cleansed;Debridement (Selective)   Selective Debridement - Location Rt anterior shin, posterior Lt LE, Rt great toe   Selective Debridement - Tools Used Forceps;Scissors   Selective Debridement - Tissue Removed slough    Wound Therapy - Clinical Statement PT wounds continue to slowly fill in. Pt comes into department with no dressing on her Rt LE.  Anterior wound now has a cm red halo surrounding it therefore changed dressing from honey to silver alginate.     Wound Therapy - Functional Problem List difficult walking    Factors Delaying/Impairing Wound Healing Altered sensation;Infection - systemic/local;Multiple medical problems;Polypharmacy;Vascular compromise   Hydrotherapy Plan Debridement;Dressing change;Patient/family education;Pulsatile lavage with suction   Wound Therapy - Frequency --  2x/week for a total of 15 weeks)   Wound Therapy - Follow Up Recommendations --  Assess red halo around Rt shin wound.  Possible antibiotic   Wound Plan Pt to recieve pulse lavage prn , debridement and dressing change to all wounds on bilateral LE's.   Dressing  Rt great toe: silverhydrofiber packed into wound followed by 2x2 and 2" kling;   Dressing Rt anterior and Lt posterior  medihoney colloid followed by 4x4; kerlix and #6 netting  to Rt LE with profore lite placed on loosely to right LE in hopes that pt will keep her dressing on.                    PT Short Term Goals - 01/31/16 1403      PT SHORT TERM GOAL #1   Title Pt Lt LE to have 50% granulation to decrease risk of infection   Time 8  time updated due to pt not coming for rx for the past 2 weeks and hospitalizations    Period Weeks   Status Achieved     PT SHORT TERM GOAL #2   Title Pt and familty to be able to verbalize the signs and symptoms of infection and the importance of seeking medical help   Time 1   Period Weeks  Status Achieved     PT SHORT TERM GOAL #3   Title Pt Rt toe wound to no longer have purulent drainage to demonstrate decreased infection    Time 3   Period Weeks   Status Achieved     PT SHORT TERM GOAL #4   Title Pt pain in both legs to be no greater than a 5/10 to allow pt to ambulate in comfort in her home   Time 3   Period Weeks   Status Achieved           PT Long Term Goals - 01/31/16 1403      PT LONG TERM GOAL #1   Title Lt LE wound to be healed    Time 12  time revised due to pt being in hospital and missing appointments then pt not being seen for 2 weeks due to daughter attempting to find facility for pt to live    Period Weeks   Status Not Met     PT LONG TERM GOAL #2   Title Rt great toe wound to have filled in depth to no greater than .3 and minimal undermining to allow family to be comfortable with dressing wound    Baseline 11/30/2015 depth is now .3 but wound continues to have significant undermining.    Time 12   Period Weeks   Status Partially Met               Plan - 01/31/16 1402    Clinical Impression Statement see above    Rehab Potential Good   PT Frequency 3x / week   PT Duration 6 weeks   PT Treatment/Interventions ADLs/Self Care Home Management;Patient/family education;Other (comment)  debridement and dressing change   PT Next Visit Plan Continue wound care twice a  week for the next  6 weeks.    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 Pain of left hand 11/08/2015  . Hand strain, left, initial encounter 11/08/2015  . Cellulitis 10/25/2015  . Cellulitis of right foot 10/24/2015  . Hypokalemia 10/24/2015  . Bradycardia 10/24/2015  . Cellulitis of foot, right 10/24/2015  . Wound infection   . Aortic atherosclerosis ([place]) 09/01/2015  . Sick sinus syndrome ([place]) 08/11/2015  . Non-sustained ventricular tachycardia ([place]) 08/11/2015  . Anticoagulated on Coumadin 08/11/2015  . CKD (chronic kidney disease) stage 3, GFR 30-59 ml/min 08/09/2015  . Elevated troponin 08/08/2015  . Renal insufficiency 08/08/2015  . Severe aortic stenosis 08/05/2015  . Hyperlipidemia 06/10/2015  . Tobacco use disorder 01/28/2014  . Osteoporosis with pathological fracture 08/13/2012  . DVT (deep venous thrombosis), right 05/13/2012  . HTN (hypertension) 05/13/2012  . Long term current use of anticoagulant therapy 05/13/2012  . Atrial fibrillation [facility]) 04/15/2012    Rayetta [name], PT CLT [phone] 01/31/2016, 2:04 PM  [facility] [address] [place], [place], [address] Phone: [phone]   Fax:  [phone]  Name:  Candace [name] MRN: [id number] Date of Birth: 1922-03-25

## 2016-02-01 ENCOUNTER — Encounter: Payer: Self-pay | Admitting: Family Medicine

## 2016-02-01 ENCOUNTER — Ambulatory Visit (INDEPENDENT_AMBULATORY_CARE_PROVIDER_SITE_OTHER): Payer: Medicare Other | Admitting: Family Medicine

## 2016-02-01 VITALS — BP 148/71 | HR 75 | Temp 96.6°F | Ht <= 58 in | Wt 87.2 lb

## 2016-02-01 DIAGNOSIS — F411 Generalized anxiety disorder: Secondary | ICD-10-CM | POA: Diagnosis not present

## 2016-02-01 DIAGNOSIS — Z7901 Long term (current) use of anticoagulants: Secondary | ICD-10-CM

## 2016-02-01 DIAGNOSIS — I4891 Unspecified atrial fibrillation: Secondary | ICD-10-CM | POA: Diagnosis not present

## 2016-02-01 DIAGNOSIS — I82401 Acute embolism and thrombosis of unspecified deep veins of right lower extremity: Secondary | ICD-10-CM

## 2016-02-01 LAB — COAGUCHEK XS/INR WAIVED
INR: 1.3 — ABNORMAL HIGH (ref 0.9–1.1)
Prothrombin Time: 15.8 s

## 2016-02-01 MED ORDER — ALPRAZOLAM 0.25 MG PO TABS: 0.2500 mg | ORAL_TABLET | ORAL | 0 refills | Status: AC | PRN

## 2016-02-01 NOTE — Patient Instructions (Signed)
Anticoagulation Dose Instructions as of 02/01/2016      Candace [name] Tue Wed Thu Fri Sat   New Dose 2 mg 3 mg 2 mg 2 mg 3 mg 2 mg 2 mg    Description   Take 2 tablets today and tomorrow.  Then increase warfarin dose to 1 and 1/2 on Monday and Thursdays.  Take 1 tablet

## 2016-02-01 NOTE — Progress Notes (Signed)
Subjective:    Patient ID: Candace [name], female    DOB: 04/11/1922, 83 y.o.   MRN: [id number]  HPI 83 year old female who complains of nervousness and anxiety. She has not had a history of anxiety and she is not really sure why she does now but she just feels nervous and shaky. Her back pain is controlled with hydrocodone. She is on Coumadin because she has atrial fibrillation.  Patient Active Problem List   Diagnosis Date Noted  . Pain of left hand 11/08/2015  . Hand strain, left, initial encounter 11/08/2015  . Cellulitis 10/25/2015  . Cellulitis of right foot 10/24/2015  . Hypokalemia 10/24/2015  . Bradycardia 10/24/2015  . Cellulitis of foot, right 10/24/2015  . Wound infection   . Aortic atherosclerosis ([place]) 09/01/2015  . Sick sinus syndrome ([place]) 08/11/2015  . Non-sustained ventricular tachycardia ([place]) 08/11/2015  . Anticoagulated on Coumadin 08/11/2015  . CKD (chronic kidney disease) stage 3, GFR 30-59 ml/min 08/09/2015  . Elevated troponin 08/08/2015  . Renal insufficiency 08/08/2015  . Severe aortic stenosis 08/05/2015  . Hyperlipidemia 06/10/2015  . Tobacco use disorder 01/28/2014  . Osteoporosis with pathological fracture 08/13/2012  . DVT (deep venous thrombosis), right 05/13/2012  . HTN (hypertension) 05/13/2012  . Long term current use of anticoagulant therapy 05/13/2012  . Atrial fibrillation ([place]) 04/15/2012   Outpatient Encounter Prescriptions as of 02/01/2016  Medication Sig  . acetaminophen (TYLENOL) 500 MG tablet Take 1 tablet (500 mg total) by mouth every 8 (eight) hours as needed for mild pain.  Marland Kitchen amLODipine (NORVASC) 2.5 MG tablet Take 1 tablet (2.5 mg total) by mouth daily.  . brimonidine (ALPHAGAN P) 0.1 % SOLN Apply 1 drop to eye 2 (two) times daily.   Marland Kitchen denosumab (PROLIA) 60 MG/ML SOLN injection INJECT 60 MG INTO THE SKIN EVERY 6 MONTHS. BRING TO OFFICE FOR ADMINSTRATION  . furosemide (LASIX) 20 MG tablet TAKE ONE TABLET BY MOUTH ONCE DAILY  .  HYDROcodone-acetaminophen (NORCO/VICODIN) 5-325 MG tablet Take 1 tablet by mouth every 4 (four) hours as needed.  Marland Kitchen LUMIGAN 0.01 % SOLN INSTILL ONE DROP INTO EACH EYE AT BEDTIME  . nitroGLYCERIN (NITROSTAT) 0.4 MG SL tablet Place 1 tablet (0.4 mg total) under the tongue every 5 (five) minutes as needed for chest pain.  . simvastatin (ZOCOR) 40 MG tablet Take 1 tablet (40 mg total) by mouth at bedtime.  Marland Kitchen warfarin (COUMADIN) 2 MG tablet TAKE ONE OR TWO TABLETS BY MOUTH ONCE DAILY AS DIRECTED BY ANTICOAGULATION (Patient taking differently: TAKE ONE TABLET BY MOUTH ONCE DAILY AS DIRECTED BY ANTICOAGULATION)   No facility-administered encounter medications on file as of 02/01/2016.       Review of Systems  HENT: Negative.   Gastrointestinal: Negative.   Psychiatric/Behavioral: Positive for sleep disturbance. The patient is nervous/anxious.        Objective:   Physical Exam  Constitutional: She is oriented to person, place, and time. She appears well-developed and well-nourished.  Cardiovascular: Normal rate and normal heart sounds.   Pulmonary/Chest: Effort normal.  Neurological: She is alert and oriented to person, place, and time.  Psychiatric: She has a normal mood and affect. Her behavior is normal.  She is alert and oriented 3. As she extends her hands they are shaky. Thought content seems normal as does judgment.   BP (!) 166/75   Pulse (!) 46   Temp (!) 96.6 F (35.9 C) (Oral)   Ht 4\' 3"  (1.295 m)   Wt 87 lb  3.2 oz (39.6 kg)   BMI 23.57 kg/m        Assessment & Plan:  1. DVT (deep venous thrombosis), right We'll see clinical pharmacologist for check - CoaguChek XS/INR Waived  2. Long term current use of anticoagulant therapy  - CoaguChek XS/INR Waived  3. Atrial fibrillation, unspecified type ([place]) Rate is controlled. - CoaguChek XS/INR Waived  4. Anxiety state We'll try her on low-dose alprazolam to take once a day as needed  Wardell Honour MD

## 2016-02-02 ENCOUNTER — Encounter: Payer: Self-pay | Admitting: Pharmacist

## 2016-02-03 ENCOUNTER — Ambulatory Visit (HOSPITAL_COMMUNITY): Payer: Medicare Other | Admitting: Physical Therapy

## 2016-02-03 ENCOUNTER — Telehealth: Payer: Self-pay | Admitting: Nurse Practitioner

## 2016-02-03 DIAGNOSIS — M79662 Pain in left lower leg: Secondary | ICD-10-CM | POA: Diagnosis not present

## 2016-02-03 DIAGNOSIS — L97223 Non-pressure chronic ulcer of left calf with necrosis of muscle: Secondary | ICD-10-CM | POA: Diagnosis not present

## 2016-02-03 DIAGNOSIS — M79661 Pain in right lower leg: Secondary | ICD-10-CM

## 2016-02-03 DIAGNOSIS — R262 Difficulty in walking, not elsewhere classified: Secondary | ICD-10-CM | POA: Diagnosis not present

## 2016-02-03 DIAGNOSIS — S91141D Puncture wound with foreign body of right great toe without damage to nail, subsequent encounter: Secondary | ICD-10-CM | POA: Diagnosis not present

## 2016-02-03 NOTE — Therapy (Signed)
[place], [place], [address] Phone: [phone]   Fax:  [phone]  Wound Care Therapy  Patient Details  Name: Candace [name] MRN: [id number] Date of Birth: 1922/11/18 Referring Provider: Particia Nearing   Encounter Date: 02/03/2016      PT End of Session - 02/03/16 1211    Visit Number 27   Number of Visits 30   Date for PT Re-Evaluation 02/22/16   Authorization Type [employer] medicare: g code and recert completed on visit 01/10/1016   Authorization - Visit Number 3   Authorization - Number of Visits 30   PT Start Time 1120   PT Stop Time 1200   PT Time Calculation (min) 40 min   Activity Tolerance Patient tolerated treatment well;No increased pain   Behavior During Therapy WFL for tasks assessed/performed      Past Medical History:  Diagnosis Date  . Aortic stenosis   . Atrial fibrillation ([place])   . DVT (deep venous thrombosis) ([place])   . Essential hypertension   . Glaucoma   . History of skin cancer 2016  . Hypercholesterolemia   . PE (pulmonary embolism)     Past Surgical History:  Procedure Laterality Date  . CATARACT EXTRACTION    . CHOLECYSTECTOMY    . KYPHOPLASTY    . SKIN CANCER EXCISION Left 2016   neck    There were no vitals filed for this visit.                  Wound Therapy - 02/03/16 1204    Subjective Pt states that she is having some pain in her right foot and the back of her left calf    Patient and Family Stated Goals Wounds to heal    Date of Onset 09/23/15   Prior Treatments peroxide to Lt wound and antibiotic.    Pain Assessment 0-10   Pain Score 3    Pain Type Chronic pain   Pain Location Leg   Pain Orientation Left   Pain Descriptors / Indicators Aching   Pain Onset On-going   Pain Intervention(s) Emotional support   Multiple Pain Sites Yes   Pain Score 3   Pain Type Chronic pain   Pain Location Toe (Comment which one)   Pain Orientation Right   Pain  Descriptors / Indicators Shooting   Pain Onset With Activity   Patient's Stated Pain Goal 1   Pain Intervention(s) Emotional support   Evaluation and Treatment Procedures Explained to Patient/Family Yes   Evaluation and Treatment Procedures agreed to   Wound Properties Date First Assessed: 11/08/15 Time First Assessed: 1130 Wound Type: Laceration Location: Leg Location Orientation: Right;Anterior Wound Description (Comments): Rt anterior shin, hit on recliner lever at home Present on Admission: No   Dressing Type Compression wrap;Gauze (Comment);Silver hydrofiber  Pt comes in with no dressing on RT LE; used silver alginate.   Dressing Changed Changed   Dressing Status Old drainage   Dressing Change Frequency PRN   Site / Wound Assessment Red;Yellow   % Wound base Red or Granulating 85%   % Wound base Yellow/Fibrinous Exudate 15%   % Wound base Other/Granulation Tissue (Comment) 0%   Peri-wound Assessment Erythema (blanchable)  1.0 cm halo therefore dressing changed to silver alginate   Margins Attached edges (approximated)   Closure None   Drainage Amount Scant   Drainage Description Serous   Treatment Cleansed;Debridement (Selective)   Wound Properties Date First Assessed: 10/24/15  Time First Assessed: 1530 Wound Type: Puncture Location: Toe (Comment  which one) Location Orientation: Right Wound Description (Comments): plantar aspect of great toe    Dressing Type Silver hydrofiber;Gauze (Comment)  silver hydrofiber, 4x4, gauze and netting   Dressing Changed Changed   Dressing Status Intact   Dressing Change Frequency PRN   Site / Wound Assessment Red   % Wound base Red or Granulating 95%   % Wound base Yellow/Fibrinous Exudate 5%   Peri-wound Assessment Intact;Pink   Margins Unattached edges (unapproximated)   Drainage Amount Minimal   Drainage Description Purulent;No odor   Treatment Cleansed;Debridement (Selective)   Wound Properties Date First Assessed: 10/24/15 Time First  Assessed: 1615 Wound Type: Other (Comment) Location: Leg Location Orientation: Left;Posterior;Lower Wound Description (Comments): dry Present on Admission: Yes   Dressing Type Compression wrap;Gauze (Comment)  Medihoney, 4x4, kerlix and netting   Dressing Changed Changed   Dressing Status Old drainage   Dressing Change Frequency PRN   Site / Wound Assessment Pink;Yellow;Painful;Granulation tissue;Dusky   % Wound base Red or Granulating 80%  was 50%    % Wound base Yellow/Fibrinous Exudate 20%   Peri-wound Assessment Intact;Maceration   Margins Unattached edges (unapproximated)   Closure None   Drainage Amount Minimal   Drainage Description Serosanguineous   Treatment Cleansed;Debridement (Selective)   Selective Debridement - Location Rt anterior shin, posterior Lt LE, Rt great toe   Selective Debridement - Tools Used Forceps;Scissors   Selective Debridement - Tissue Removed slough    Wound Therapy - Clinical Statement Posterior wound on Lt has slight maceration therefore therapist placed lightly moistened silver alginate on wound instead of honey colloid.     Wound Therapy - Functional Problem List difficult walking    Factors Delaying/Impairing Wound Healing Altered sensation;Infection - systemic/local;Multiple medical problems;Polypharmacy;Vascular compromise   Hydrotherapy Plan Debridement;Dressing change;Patient/family education;Pulsatile lavage with suction   Wound Therapy - Frequency --  2x/week for a total of 15 weeks)   Wound Therapy - Follow Up Recommendations --  Assess red halo around Rt shin wound.  Possible antibiotic   Wound Plan Pt to recieve pulse lavage prn , debridement and dressing change to all wounds on bilateral LE's.   Dressing  all wounds: silverhydrofiber packed into wound followed by 2x2 and 2" kling;   Dressing --                   PT Short Term Goals - 01/31/16 1403      PT SHORT TERM GOAL #1   Title Pt Lt LE to have 50% granulation to  decrease risk of infection   Time 8  time updated due to pt not coming for rx for the past 2 weeks and hospitalizations    Period Weeks   Status Achieved     PT SHORT TERM GOAL #2   Title Pt and familty to be able to verbalize the signs and symptoms of infection and the importance of seeking medical help   Time 1   Period Weeks   Status Achieved     PT SHORT TERM GOAL #3   Title Pt Rt toe wound to no longer have purulent drainage to demonstrate decreased infection    Time 3   Period Weeks   Status Achieved     PT SHORT TERM GOAL #4   Title Pt pain in both legs to be no greater than a 5/10 to allow pt to ambulate in comfort in her home   Time 3  Period Weeks   Status Achieved           PT Long Term Goals - 01/31/16 1403      PT LONG TERM GOAL #1   Title Lt LE wound to be healed    Time 12  time revised due to pt being in hospital and missing appointments then pt not being seen for 2 weeks due to daughter attempting to find facility for pt to live    Period Weeks   Status Not Met     PT LONG TERM GOAL #2   Title Rt great toe wound to have filled in depth to no greater than .3 and minimal undermining to allow family to be comfortable with dressing wound    Baseline 11/30/2015 depth is now .3 but wound continues to have significant undermining.    Time 12   Period Weeks   Status Partially Met               Plan - 02/03/16 1212    Clinical Impression Statement Wounds appear stable will cut down to once a week following next week.     Rehab Potential Good   PT Frequency 3x / week   PT Duration 6 weeks   PT Treatment/Interventions ADLs/Self Care Home Management;Patient/family education;Other (comment)  debridement and dressing change   PT Next Visit Plan Continue wound care twice a week for the next  6 weeks.    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 Pain of left hand 11/08/2015  . Hand strain, left, initial encounter 11/08/2015  . Cellulitis 10/25/2015  . Cellulitis of right foot 10/24/2015  . Hypokalemia 10/24/2015  . Bradycardia 10/24/2015  . Cellulitis of foot, right 10/24/2015  . Wound infection   . Aortic atherosclerosis ([place]) 09/01/2015  . Sick sinus syndrome ([place]) 08/11/2015  . Non-sustained ventricular tachycardia ([place]) 08/11/2015  . Anticoagulated on Coumadin 08/11/2015  . CKD (chronic kidney disease) stage 3, GFR 30-59 ml/min 08/09/2015  . Elevated troponin 08/08/2015  . Renal insufficiency 08/08/2015  . Severe aortic stenosis 08/05/2015  . Hyperlipidemia 06/10/2015  . Tobacco use disorder 01/28/2014  . Osteoporosis with pathological fracture 08/13/2012  . DVT (deep venous thrombosis), right 05/13/2012  . HTN (hypertension) 05/13/2012  . Long term current use of anticoagulant therapy 05/13/2012  . Atrial fibrillation [facility]) 04/15/2012    Rayetta [name], PT CLT [phone] 02/03/2016, 12:13 PM  [facility] [address] [place], [place], [address] Phone: [phone]   Fax:  [phone]  Name: Candace [name] MRN: [id number] Date of Birth: 1922/08/30

## 2016-02-05 DIAGNOSIS — L97222 Non-pressure chronic ulcer of left calf with fat layer exposed: Secondary | ICD-10-CM | POA: Diagnosis not present

## 2016-02-05 DIAGNOSIS — R296 Repeated falls: Secondary | ICD-10-CM | POA: Diagnosis not present

## 2016-02-05 DIAGNOSIS — I83022 Varicose veins of left lower extremity with ulcer of calf: Secondary | ICD-10-CM | POA: Diagnosis not present

## 2016-02-07 ENCOUNTER — Ambulatory Visit (HOSPITAL_COMMUNITY): Payer: Medicare Other | Admitting: Physical Therapy

## 2016-02-07 DIAGNOSIS — L97223 Non-pressure chronic ulcer of left calf with necrosis of muscle: Secondary | ICD-10-CM

## 2016-02-07 DIAGNOSIS — M79661 Pain in right lower leg: Secondary | ICD-10-CM

## 2016-02-07 DIAGNOSIS — M79662 Pain in left lower leg: Secondary | ICD-10-CM | POA: Diagnosis not present

## 2016-02-07 DIAGNOSIS — S91141D Puncture wound with foreign body of right great toe without damage to nail, subsequent encounter: Secondary | ICD-10-CM

## 2016-02-07 DIAGNOSIS — R262 Difficulty in walking, not elsewhere classified: Secondary | ICD-10-CM | POA: Diagnosis not present

## 2016-02-07 NOTE — Therapy (Signed)
[facility] [place], [place], [address] Phone: [phone]   Fax:  [phone]  Wound Care Therapy  Patient Details  Name: Candace [name] MRN: [id number] Date of Birth: 01-Oct-1922 Referring Provider: Particia Nearing   Encounter Date: 02/07/2016      PT End of Session - 02/07/16 1349    Visit Number 28   Number of Visits 30   Date for PT Re-Evaluation 02/22/16   Authorization Type UHC medicare: g code and recert completed on visit 01/10/1016   Authorization - Visit Number 28   Authorization - Number of Visits 30   PT Start Time 2703   PT Stop Time 1340   PT Time Calculation (min) 35 min   Activity Tolerance Patient tolerated treatment well;No increased pain   Behavior During Therapy WFL for tasks assessed/performed      Past Medical History:  Diagnosis Date  . Aortic stenosis   . Atrial fibrillation ([place])   . DVT (deep venous thrombosis) ([place])   . Essential hypertension   . Glaucoma   . History of skin cancer 2016  . Hypercholesterolemia   . PE (pulmonary embolism)     Past Surgical History:  Procedure Laterality Date  . CATARACT EXTRACTION    . CHOLECYSTECTOMY    . KYPHOPLASTY    . SKIN CANCER EXCISION Left 2016   neck    There were no vitals filed for this visit.                  Wound Therapy - 02/07/16 1345    Subjective dressings intact, no pain reported   Patient and Family Stated Goals Wounds to heal    Date of Onset 09/23/15   Prior Treatments peroxide to Lt wound and antibiotic.    Pain Assessment No/denies pain   Evaluation and Treatment Procedures Explained to Patient/Family Yes   Evaluation and Treatment Procedures agreed to   Wound Properties Date First Assessed: 11/08/15 Time First Assessed: 1130 Wound Type: Laceration Location: Leg Location Orientation: Right;Anterior Wound Description (Comments): Rt anterior shin, hit on recliner lever at home Present on Admission: No   Dressing Type Compression wrap;Gauze (Comment);Silver hydrofiber  Pt comes in with no dressing on RT LE; used silver alginate.   Dressing Changed Changed   Dressing Status Old drainage   Dressing Change Frequency PRN   Site / Wound Assessment Red;Yellow   % Wound base Red or Granulating 95%   % Wound base Yellow/Fibrinous Exudate 5%   % Wound base Other/Granulation Tissue (Comment) 0%   Peri-wound Assessment Erythema (blanchable)  1.0 cm halo therefore dressing changed to silver alginate   Margins Attached edges (approximated)   Closure None   Drainage Amount Scant   Drainage Description Serous   Treatment Cleansed;Debridement (Selective)   Wound Properties Date First Assessed: 10/24/15 Time First Assessed: 1530 Wound Type: Puncture Location: Toe (Comment  which one) Location Orientation: Right Wound Description (Comments): plantar aspect of great toe    Dressing Type Silver hydrofiber;Gauze (Comment)  silver hydrofiber, 4x4, gauze and netting   Dressing Changed Changed   Dressing Status Intact   Dressing Change Frequency PRN   Site / Wound Assessment Red   % Wound base Red or Granulating 95%   % Wound base Yellow/Fibrinous Exudate 5%   Peri-wound Assessment Intact;Pink   Margins Unattached edges (unapproximated)   Drainage Amount Minimal   Drainage Description No odor;Serosanguineous   Treatment Cleansed;Debridement (Selective)   Wound Properties Date  First Assessed: 10/24/15 Time First Assessed: 1615 Wound Type: Other (Comment) Location: Leg Location Orientation: Left;Posterior;Lower Wound Description (Comments): dry Present on Admission: Yes   Dressing Type Compression wrap;Gauze (Comment)  Medihoney, 4x4, kerlix and netting   Dressing Changed Changed   Dressing Status Old drainage   Dressing Change Frequency PRN   Site / Wound Assessment Pink;Yellow;Painful;Granulation tissue;Dusky   % Wound base Red or Granulating 95%  was 50%    % Wound base Yellow/Fibrinous Exudate 5%    Peri-wound Assessment Intact;Maceration   Margins Unattached edges (unapproximated)   Closure None   Drainage Amount Minimal   Drainage Description Serosanguineous   Treatment Cleansed;Debridement (Selective)   Selective Debridement - Location Rt anterior shin, posterior Lt LE, Rt great toe   Selective Debridement - Tools Used Forceps;Scissors   Selective Debridement - Tissue Removed slough    Wound Therapy - Clinical Statement Vast improvement in wounds with increased granulation.  calcium alginate stuck to wound beds bilaterally.  Changed dressings to xeroform due to high percent granulated and wound beds needing moisture.  Continued with silver hydro on Rt great toe.  Increased approximation of borders and reduced overall size/depth of all wounds.   Wound Therapy - Functional Problem List difficult walking    Factors Delaying/Impairing Wound Healing Altered sensation;Infection - systemic/local;Multiple medical problems;Polypharmacy;Vascular compromise   Hydrotherapy Plan Debridement;Dressing change;Patient/family education;Pulsatile lavage with suction   Wound Therapy - Frequency --  2x/week for a total of 15 weeks)   Wound Therapy - Follow Up Recommendations --  Assess red halo around Rt shin wound.  Possible antibiotic   Wound Plan Pt to receive pulse lavage prn , debridement and dressing change to all wounds on bilateral LE's.   Dressing  Rt great toe:  silver hydrofiber   Dressing Rt shin: xeroform, kerlix, coban, #5 netting   Dressing Lt posterior LE:  xeroform, kerlix, coban and #5 netting                   PT Short Term Goals - 01/31/16 1403      PT SHORT TERM GOAL #1   Title Pt Lt LE to have 50% granulation to decrease risk of infection   Time 8  time updated due to pt not coming for rx for the past 2 weeks and hospitalizations    Period Weeks   Status Achieved     PT SHORT TERM GOAL #2   Title Pt and familty to be able to verbalize the signs and symptoms of  infection and the importance of seeking medical help   Time 1   Period Weeks   Status Achieved     PT SHORT TERM GOAL #3   Title Pt Rt toe wound to no longer have purulent drainage to demonstrate decreased infection    Time 3   Period Weeks   Status Achieved     PT SHORT TERM GOAL #4   Title Pt pain in both legs to be no greater than a 5/10 to allow pt to ambulate in comfort in her home   Time 3   Period Weeks   Status Achieved           PT Long Term Goals - 01/31/16 1403      PT LONG TERM GOAL #1   Title Lt LE wound to be healed    Time 12  time revised due to pt being in hospital and missing appointments then pt not being seen for 2 weeks due  to daughter attempting to find facility for pt to live    Period Weeks   Status Not Met     PT LONG TERM GOAL #2   Title Rt great toe wound to have filled in depth to no greater than .3 and minimal undermining to allow family to be comfortable with dressing wound    Baseline 11/30/2015 depth is now .3 but wound continues to have significant undermining.    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 Pain of left hand 11/08/2015  . Hand strain, left, initial encounter 11/08/2015  . Cellulitis 10/25/2015  . Cellulitis of right foot 10/24/2015  . Hypokalemia 10/24/2015  . Bradycardia 10/24/2015  . Cellulitis of foot, right 10/24/2015  . Wound infection   . Aortic atherosclerosis ([place]) 09/01/2015  . Sick sinus syndrome ([place])  08/11/2015  . Non-sustained ventricular tachycardia ([place]) 08/11/2015  . Anticoagulated on Coumadin 08/11/2015  . CKD (chronic kidney disease) stage 3, GFR 30-59 ml/min 08/09/2015  . Elevated troponin 08/08/2015  . Renal insufficiency 08/08/2015  . Severe aortic stenosis 08/05/2015  . Hyperlipidemia 06/10/2015  . Tobacco use disorder 01/28/2014  . Osteoporosis with pathological fracture 08/13/2012  . DVT (deep venous thrombosis), right 05/13/2012  . HTN (hypertension) 05/13/2012  . Long term current use of anticoagulant therapy 05/13/2012  . Atrial fibrillation ([place]) 04/15/2012    Teena Irani, PTA/CLT [phone]  02/07/2016, 1:50 PM  [facility] [address] [place], [place], [address] Phone: [phone]   Fax:  [phone]  Name: Candace [name] MRN: [id number] Date of Birth: 1922-03-17

## 2016-02-07 NOTE — Telephone Encounter (Signed)
Closing encounter. Labs are being handled by [facility], [place].

## 2016-02-08 ENCOUNTER — Ambulatory Visit (INDEPENDENT_AMBULATORY_CARE_PROVIDER_SITE_OTHER): Payer: Medicare Other | Admitting: Pharmacist

## 2016-02-08 DIAGNOSIS — I82401 Acute embolism and thrombosis of unspecified deep veins of right lower extremity: Secondary | ICD-10-CM | POA: Diagnosis not present

## 2016-02-08 DIAGNOSIS — I4891 Unspecified atrial fibrillation: Secondary | ICD-10-CM | POA: Diagnosis not present

## 2016-02-08 DIAGNOSIS — Z7901 Long term (current) use of anticoagulants: Secondary | ICD-10-CM

## 2016-02-08 LAB — COAGUCHEK XS/INR WAIVED
INR: 1.6 — AB (ref 0.9–1.1)
Prothrombin Time: 19.7 s

## 2016-02-08 NOTE — Patient Instructions (Signed)
Anticoagulation Dose Instructions as of 02/08/2016      Candace [name] Tue Wed Thu Fri Sat   New Dose 2 mg 3 mg 2 mg 3 mg 2 mg 3 mg 2 mg    Description   Take 2 tablets today (02/08/2016) and tomorrow (02/09/2016).  Then increase warfarin dose to 1 and 1/2 tablets on Mondays, Wednesdays and Fridays.  Take 1 tablet all other days.    INR was 1.6 today (goal is 2.0 to 3.0)

## 2016-02-09 DIAGNOSIS — H353134 Nonexudative age-related macular degeneration, bilateral, advanced atrophic with subfoveal involvement: Secondary | ICD-10-CM | POA: Diagnosis not present

## 2016-02-10 ENCOUNTER — Ambulatory Visit (HOSPITAL_COMMUNITY): Payer: Medicare Other | Attending: Physician Assistant | Admitting: Physical Therapy

## 2016-02-10 ENCOUNTER — Encounter: Payer: Self-pay | Admitting: Family Medicine

## 2016-02-10 ENCOUNTER — Ambulatory Visit (INDEPENDENT_AMBULATORY_CARE_PROVIDER_SITE_OTHER): Payer: Medicare Other

## 2016-02-10 ENCOUNTER — Other Ambulatory Visit: Payer: Self-pay | Admitting: Family Medicine

## 2016-02-10 ENCOUNTER — Ambulatory Visit (INDEPENDENT_AMBULATORY_CARE_PROVIDER_SITE_OTHER): Payer: Medicare Other | Admitting: Family Medicine

## 2016-02-10 VITALS — BP 144/69 | HR 57 | Temp 97.0°F

## 2016-02-10 DIAGNOSIS — R05 Cough: Secondary | ICD-10-CM

## 2016-02-10 DIAGNOSIS — M79661 Pain in right lower leg: Secondary | ICD-10-CM | POA: Diagnosis not present

## 2016-02-10 DIAGNOSIS — R001 Bradycardia, unspecified: Secondary | ICD-10-CM | POA: Diagnosis not present

## 2016-02-10 DIAGNOSIS — L97223 Non-pressure chronic ulcer of left calf with necrosis of muscle: Secondary | ICD-10-CM | POA: Diagnosis not present

## 2016-02-10 DIAGNOSIS — I959 Hypotension, unspecified: Secondary | ICD-10-CM | POA: Diagnosis not present

## 2016-02-10 DIAGNOSIS — R059 Cough, unspecified: Secondary | ICD-10-CM

## 2016-02-10 DIAGNOSIS — R061 Stridor: Secondary | ICD-10-CM | POA: Diagnosis not present

## 2016-02-10 DIAGNOSIS — R404 Transient alteration of awareness: Secondary | ICD-10-CM | POA: Diagnosis not present

## 2016-02-10 DIAGNOSIS — S91141D Puncture wound with foreign body of right great toe without damage to nail, subsequent encounter: Secondary | ICD-10-CM | POA: Diagnosis not present

## 2016-02-10 MED ORDER — DOXYCYCLINE HYCLATE 100 MG PO TABS: 100.0000 mg | ORAL_TABLET | Freq: Two times a day (BID) | ORAL | 0 refills | Status: AC

## 2016-02-10 NOTE — Therapy (Addendum)
[facility] [place], [place], [address] Phone: [phone]   Fax:  [phone]  Wound Care Therapy  Patient Details  Name: Candace [name] MRN: [id number] Date of Birth: July 01, 1922 Referring Provider: Particia Nearing   Encounter Date: 02/10/2016      PT End of Session - 02/10/16 1219    Visit Number 29   Number of Visits 30   Date for PT Re-Evaluation 02/22/16   Authorization Type U[employer] medicare: g code and recert completed on visit 01/10/1016   Authorization - Visit Number 75   Authorization - Number of Visits 30   PT Start Time 1115   PT Stop Time 1200   PT Time Calculation (min) 45 min   Activity Tolerance Patient tolerated treatment well;No increased pain   Behavior During Therapy WFL for tasks assessed/performed      Past Medical History:  Diagnosis Date  . Aortic stenosis   . Atrial fibrillation ([place])   . DVT (deep venous thrombosis) ([place])   . Essential hypertension   . Glaucoma   . History of skin cancer 2016  . Hypercholesterolemia   . PE (pulmonary embolism)     Past Surgical History:  Procedure Laterality Date  . CATARACT EXTRACTION    . CHOLECYSTECTOMY    . KYPHOPLASTY    . SKIN CANCER EXCISION Left 2016   neck    There were no vitals filed for this visit.                  Wound Therapy - 02/10/16 1213    Subjective Pt very congested.  Vocalized concern to sitter that I felt she should be taken to the MD>   Patient and Family Stated Goals Wounds to heal    Date of Onset 09/23/15   Prior Treatments peroxide to Lt wound and antibiotic.    Pain Assessment No/denies pain   Evaluation and Treatment Procedures Explained to Patient/Family Yes   Evaluation and Treatment Procedures agreed to   Wound Properties Date First Assessed: 11/08/15 Time First Assessed: 1130 Wound Type: Laceration Location: Leg Location Orientation: Right;Anterior Wound Description (Comments): Rt anterior shin, hit on  recliner lever at home Present on Admission: No   Dressing Type Compression wrap;Gauze (Comment);Impregnated gauze (bismuth)  Pt comes in with no dressing on RT LE; used silver alginate.   Dressing Changed Changed   Dressing Status Old drainage   Dressing Change Frequency PRN   Site / Wound Assessment Red   % Wound base Red or Granulating 100%   % Wound base Yellow/Fibrinous Exudate 0%   % Wound base Other/Granulation Tissue (Comment) 0%   Peri-wound Assessment Intact  1.0 cm halo therefore dressing changed to silver alginate   Margins Attached edges (approximated)   Closure None   Drainage Amount Scant   Drainage Description Serous   Treatment Cleansed   Wound Properties Date First Assessed: 10/24/15 Time First Assessed: 1530 Wound Type: Puncture Location: Toe (Comment  which one) Location Orientation: Right Wound Description (Comments): plantar aspect of great toe    Dressing Type Gauze (Comment);Silver hydrofiber  silver hydrofiber, 4x4, gauze and netting   Dressing Changed Changed   Dressing Status Intact   Dressing Change Frequency PRN   Site / Wound Assessment Red   % Wound base Red or Granulating 95%   % Wound base Yellow/Fibrinous Exudate 5%   Peri-wound Assessment Intact;Pink   Margins Unattached edges (unapproximated)   Drainage Amount Scant   Drainage  Description No odor;Serosanguineous   Treatment Cleansed;Debridement (Selective)   Wound Properties Date First Assessed: 10/24/15 Time First Assessed: 1615 Wound Type: Other (Comment) Location: Leg Location Orientation: Left;Posterior;Lower Wound Description (Comments): dry Present on Admission: Yes   Dressing Type Compression wrap;Gauze (Comment);Impregnated gauze (bismuth)  Medihoney, 4x4, kerlix and netting   Dressing Status Old drainage   Dressing Change Frequency PRN   Site / Wound Assessment Pink;Yellow;Painful;Granulation tissue;Dusky   % Wound base Red or Granulating 95%  was 50%    % Wound base Yellow/Fibrinous  Exudate 5%   Peri-wound Assessment Intact   Margins Unattached edges (unapproximated)   Closure None   Drainage Amount Minimal   Drainage Description Serosanguineous   Treatment Cleansed;Debridement (Selective)   Selective Debridement - Location Lt posterior and RT toe wound only    Selective Debridement - Tools Used Forceps;Scissors   Selective Debridement - Tissue Removed slough    Wound Therapy - Clinical Statement Wounds continue to improve.  Wounds on Rt LE maybe healed by next week.  Wound on the posterior aspect of pt Lt LE continues to slowly improve.  Pt may benefit from homehealth nursing to change dressing after next week.    Wound Therapy - Functional Problem List difficult walking    Factors Delaying/Impairing Wound Healing Altered sensation;Infection - systemic/local;Multiple medical problems;Polypharmacy;Vascular compromise   Hydrotherapy Plan Debridement;Dressing change;Patient/family education   Wound Therapy - Frequency --  2x/week for a total of 15 weeks)   Wound Therapy - Follow Up Recommendations --  Assess red halo around Rt shin wound.  Possible antibiotic   Wound Plan Pt Rt anterior wound no longer in need of debridement.  Measure wounds next visit with possible discharge to nursing for dressing change.   Dressing  Rt great toe:  silver hydrofiber   Dressing Rt shin: xeroform, kerlix, coban, #5 netting                   PT Short Term Goals - 01/31/16 1403      PT SHORT TERM GOAL #1   Title Pt Lt LE to have 50% granulation to decrease risk of infection   Time 8  time updated due to pt not coming for rx for the past 2 weeks and hospitalizations    Period Weeks   Status Achieved     PT SHORT TERM GOAL #2   Title Pt and familty to be able to verbalize the signs and symptoms of infection and the importance of seeking medical help   Time 1   Period Weeks   Status Achieved     PT SHORT TERM GOAL #3   Title Pt Rt toe wound to no longer have purulent  drainage to demonstrate decreased infection    Time 3   Period Weeks   Status Achieved     PT SHORT TERM GOAL #4   Title Pt pain in [name] legs to be no greater than a 5/10 to allow pt to ambulate in comfort in her home   Time 3   Period Weeks   Status Achieved           PT Long Term Goals - 01/31/16 1403      PT LONG TERM GOAL #1   Title Lt LE wound to be healed    Time 12  time revised due to pt being in hospital and missing appointments then pt not being seen for 2 weeks due to daughter attempting to find facility for pt to live  Period Weeks   Status Not Met     PT LONG TERM GOAL #2   Title Rt great toe wound to have filled in depth to no greater than .3 and minimal undermining to allow family to be comfortable with dressing wound    Baseline 11/30/2015 depth is now .3 but wound continues to have significant undermining.    Time 12   Period Weeks   Status Partially Met               Plan - 02/10/16 1220    Clinical Impression Statement see above   Rehab Potential Good   PT Frequency 3x / week   PT Duration 6 weeks   PT Treatment/Interventions ADLs/Self Care Home Management;Patient/family education;Other (comment)  debridement and dressing change   PT Next Visit Plan Continue wound care twice a week for the next  6 weeks.    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 Pain of left hand 11/08/2015  . Hand strain, left, initial encounter 11/08/2015  . Cellulitis 10/25/2015  . Cellulitis of  right foot 10/24/2015  . Hypokalemia 10/24/2015  . Bradycardia 10/24/2015  . Cellulitis of foot, right 10/24/2015  . Wound infection   . Aortic atherosclerosis ([place]) 09/01/2015  . Sick sinus syndrome ([place]) 08/11/2015  . Non-sustained ventricular tachycardia ([place]) 08/11/2015  . Anticoagulated on Coumadin 08/11/2015  . CKD (chronic kidney disease) stage 3, GFR 30-59 ml/min 08/09/2015  . Elevated troponin 08/08/2015  . Renal insufficiency 08/08/2015  . Severe aortic stenosis 08/05/2015  . Hyperlipidemia 06/10/2015  . Tobacco use disorder 01/28/2014  . Osteoporosis with pathological fracture 08/13/2012  . DVT (deep venous thrombosis), right 05/13/2012  . HTN (hypertension) 05/13/2012  . Long term current use of anticoagulant therapy 05/13/2012  . Atrial fibrillation [facility]) 04/15/2012    Rayetta [name], PT CLT [phone] 02/10/2016, 12:21 PM  [facility] [address] [place], [place], [address] Phone: [phone]   Fax:  [phone]  Name: Candace [name] MRN: [id number] Date of Birth: [birth year]   PHYSICAL THERAPY DISCHARGE SUMMARY  Visits from Start of Care: 29  Current functional level related to goals / functional outcomes: PT went to ER following this treatment with SOB and expired.   Remaining deficits:     Plan: Patient agrees to discharge.  Patient goals were partially met. Patient is being discharged due to                                                     ?????PT death.  Rayetta [name], [place] CLT [phone]

## 2016-02-10 NOTE — Progress Notes (Signed)
Subjective:    Patient ID: Candace [name], female    DOB: Aug 25, 1922, 83 y.o.   MRN: [id number]  HPI 83 year old female with a 2-3 day history of cough that is sometimes productive sometimes dry she is complaining of weakness she's had some diarrhea. She was exposed to a granddaughter who was diagnosed with flu 3 days ago. She denies any fever  Patient Active Problem List   Diagnosis Date Noted  . Pain of left hand 11/08/2015  . Hand strain, left, initial encounter 11/08/2015  . Cellulitis 10/25/2015  . Cellulitis of right foot 10/24/2015  . Hypokalemia 10/24/2015  . Bradycardia 10/24/2015  . Cellulitis of foot, right 10/24/2015  . Wound infection   . Aortic atherosclerosis ([place]) 09/01/2015  . Sick sinus syndrome ([place]) 08/11/2015  . Non-sustained ventricular tachycardia ([place]) 08/11/2015  . Anticoagulated on Coumadin 08/11/2015  . CKD (chronic kidney disease) stage 3, GFR 30-59 ml/min 08/09/2015  . Elevated troponin 08/08/2015  . Renal insufficiency 08/08/2015  . Severe aortic stenosis 08/05/2015  . Hyperlipidemia 06/10/2015  . Tobacco use disorder 01/28/2014  . Osteoporosis with pathological fracture 08/13/2012  . DVT (deep venous thrombosis), right 05/13/2012  . HTN (hypertension) 05/13/2012  . Long term current use of anticoagulant therapy 05/13/2012  . Atrial fibrillation ([place]) 04/15/2012   Outpatient Encounter Prescriptions as of 02/10/2016  Medication Sig  . acetaminophen (TYLENOL) 500 MG tablet Take 1 tablet (500 mg total) by mouth every 8 (eight) hours as needed for mild pain.  [facility] Kitchen ALPRAZolam (XANAX) 0.25 MG tablet Take 1 tablet (0.25 mg total) by mouth as needed for anxiety.  Marland Kitchen amLODipine (NORVASC) 2.5 MG tablet Take 1 tablet (2.5 mg total) by mouth daily.  . brimonidine (ALPHAGAN P) 0.1 % SOLN Apply 1 drop to eye 2 (two) times daily.   Mar[facility] Kitchen denosumab (PROLIA) 60 MG/ML SOLN injection INJECT 60 MG INTO THE SKIN EVERY 6 MONTHS. BRING TO OFFICE FOR ADMINSTRATION  . furosemide  (LASIX) 20 MG tablet TAKE ONE TABLET BY MOUTH ONCE DAILY  . HYDROcodone-acetaminophen (NORCO/VICODIN) 5-325 MG tablet Take 1 tablet by mouth every 4 (four) hours as needed.  Mar[facility] Kitchen LUMIGAN 0.01 % SOLN INSTILL ONE DROP INTO EACH EYE AT BEDTIME  . nitroGLYCERIN (NITROSTAT) 0.4 MG SL tablet Place 1 tablet (0.4 mg total) under the tongue every 5 (five) minutes as needed for chest pain.  . simvastatin (ZOCOR) 40 MG tablet Take 1 tablet (40 mg total) by mouth at bedtime.  Marland Kitchen warfarin (COUMADIN) 2 MG tablet TAKE ONE OR TWO TABLETS BY MOUTH ONCE DAILY AS DIRECTED BY ANTICOAGULATION (Patient taking differently: TAKE ONE TABLET BY MOUTH ONCE DAILY AS DIRECTED BY ANTICOAGULATION)   No facility-administered encounter medications on file as of 02/10/2016.       Review of Systems  Constitutional: Positive for fatigue.  HENT: Positive for congestion.   Respiratory: Positive for cough.        Objective:   Physical Exam  Constitutional: She appears well-developed and well-nourished.  HENT:  Mouth/Throat: Oropharynx is clear and moist.  Pulmonary/Chest: Effort normal. She has wheezes. She has rales.   BP (!) 144/69   Pulse (!) 57   Temp 97 F (36.1 C) (Oral)   SpO2 (!) 86%         Assessment & Plan:  1. Cough Chest x-ray shows poorly expanded lungs do primarily to her severe kyphosis. Do not see any infiltrate but clinically there is lots of congestion with rhonchi and wheezes. Given the fact that she  has some diarrhea with this respiratory illnesses would like to treat with doxycycline 100 mg twice a day. I have asked her to increase her fluid intake. Also asked her to pick up Mucinex DM at the pharmacy. She has been asked to call her let us know if symptoms do not improve or get worse. Wardell Honour MD - DG Chest 2 View; Future

## 2016-02-11 ENCOUNTER — Encounter (HOSPITAL_COMMUNITY): Payer: Self-pay | Admitting: Emergency Medicine

## 2016-02-11 ENCOUNTER — Inpatient Hospital Stay (HOSPITAL_COMMUNITY)
Admission: EM | Admit: 2016-02-11 | Discharge: 2016-03-08 | DRG: 152 | Disposition: E | Payer: Medicare Other | Attending: Internal Medicine | Admitting: Internal Medicine

## 2016-02-11 DIAGNOSIS — I350 Nonrheumatic aortic (valve) stenosis: Secondary | ICD-10-CM

## 2016-02-11 DIAGNOSIS — R69 Illness, unspecified: Secondary | ICD-10-CM | POA: Diagnosis not present

## 2016-02-11 DIAGNOSIS — F17290 Nicotine dependence, other tobacco product, uncomplicated: Secondary | ICD-10-CM | POA: Diagnosis present

## 2016-02-11 DIAGNOSIS — Z86711 Personal history of pulmonary embolism: Secondary | ICD-10-CM

## 2016-02-11 DIAGNOSIS — H409 Unspecified glaucoma: Secondary | ICD-10-CM | POA: Diagnosis present

## 2016-02-11 DIAGNOSIS — N183 Chronic kidney disease, stage 3 unspecified: Secondary | ICD-10-CM | POA: Diagnosis present

## 2016-02-11 DIAGNOSIS — J111 Influenza due to unidentified influenza virus with other respiratory manifestations: Principal | ICD-10-CM

## 2016-02-11 DIAGNOSIS — E7800 Pure hypercholesterolemia, unspecified: Secondary | ICD-10-CM | POA: Diagnosis not present

## 2016-02-11 DIAGNOSIS — Z79899 Other long term (current) drug therapy: Secondary | ICD-10-CM

## 2016-02-11 DIAGNOSIS — I129 Hypertensive chronic kidney disease with stage 1 through stage 4 chronic kidney disease, or unspecified chronic kidney disease: Secondary | ICD-10-CM | POA: Diagnosis present

## 2016-02-11 DIAGNOSIS — Z66 Do not resuscitate: Secondary | ICD-10-CM | POA: Diagnosis present

## 2016-02-11 DIAGNOSIS — Z85828 Personal history of other malignant neoplasm of skin: Secondary | ICD-10-CM | POA: Diagnosis not present

## 2016-02-11 DIAGNOSIS — Z885 Allergy status to narcotic agent status: Secondary | ICD-10-CM

## 2016-02-11 DIAGNOSIS — Z888 Allergy status to other drugs, medicaments and biological substances status: Secondary | ICD-10-CM

## 2016-02-11 DIAGNOSIS — I4891 Unspecified atrial fibrillation: Secondary | ICD-10-CM | POA: Diagnosis present

## 2016-02-11 DIAGNOSIS — Z882 Allergy status to sulfonamides status: Secondary | ICD-10-CM | POA: Diagnosis not present

## 2016-02-11 DIAGNOSIS — Z515 Encounter for palliative care: Secondary | ICD-10-CM | POA: Diagnosis present

## 2016-02-11 DIAGNOSIS — Z7901 Long term (current) use of anticoagulants: Secondary | ICD-10-CM | POA: Diagnosis not present

## 2016-02-11 DIAGNOSIS — J9690 Respiratory failure, unspecified, unspecified whether with hypoxia or hypercapnia: Secondary | ICD-10-CM | POA: Diagnosis present

## 2016-02-11 DIAGNOSIS — Z8249 Family history of ischemic heart disease and other diseases of the circulatory system: Secondary | ICD-10-CM

## 2016-02-11 DIAGNOSIS — Z880 Allergy status to penicillin: Secondary | ICD-10-CM

## 2016-02-11 DIAGNOSIS — J9600 Acute respiratory failure, unspecified whether with hypoxia or hypercapnia: Secondary | ICD-10-CM

## 2016-02-11 DIAGNOSIS — J9601 Acute respiratory failure with hypoxia: Secondary | ICD-10-CM

## 2016-02-11 MED ORDER — SODIUM CHLORIDE 0.9% FLUSH
3.0000 mL | Freq: Two times a day (BID) | INTRAVENOUS | Status: DC
  Administered 2016-02-11: 3 mL via INTRAVENOUS

## 2016-02-11 MED ORDER — MORPHINE SULFATE (PF) 2 MG/ML IV SOLN
2.0000 mg | Freq: Once | INTRAVENOUS | Status: AC
Start: 2016-02-11 — End: 2016-02-11
  Administered 2016-02-11: 2 mg via INTRAVENOUS
  Filled 2016-02-11: qty 1

## 2016-02-11 MED ORDER — MORPHINE SULFATE 25 MG/ML IV SOLN
INTRAVENOUS | Status: AC
  Filled 2016-02-11: qty 10

## 2016-02-11 MED ORDER — SODIUM CHLORIDE 0.9 % IV SOLN
2.0000 mg/h | INTRAVENOUS | Status: DC
  Filled 2016-02-11: qty 10

## 2016-02-11 MED ORDER — MORPHINE SULFATE 25 MG/ML IV SOLN
1.0000 mg/h | INTRAVENOUS | Status: DC
Start: 2016-02-11 — End: 2016-02-11
  Administered 2016-02-11: 1 mg/h via INTRAVENOUS
  Filled 2016-02-11: qty 10

## 2016-02-11 MED ORDER — ATROPINE SULFATE 1 % OP SOLN
1.0000 [drp] | Freq: Three times a day (TID) | OPHTHALMIC | Status: DC
  Filled 2016-02-11: qty 2

## 2016-02-12 DIAGNOSIS — I4891 Unspecified atrial fibrillation: Secondary | ICD-10-CM | POA: Diagnosis not present

## 2016-02-12 DIAGNOSIS — N183 Chronic kidney disease, stage 3 (moderate): Secondary | ICD-10-CM

## 2016-02-12 DIAGNOSIS — J9601 Acute respiratory failure with hypoxia: Secondary | ICD-10-CM | POA: Diagnosis not present

## 2016-02-12 DIAGNOSIS — R69 Illness, unspecified: Secondary | ICD-10-CM | POA: Diagnosis not present

## 2016-02-12 NOTE — Progress Notes (Signed)
[facility] transported PT from room to [facility] at 0235. PT jewelry left in place and remained on PT. Family had requested jewelry be removed.

## 2016-02-15 ENCOUNTER — Ambulatory Visit (HOSPITAL_COMMUNITY): Payer: Medicare Other | Admitting: Physical Therapy

## 2016-02-17 ENCOUNTER — Encounter: Payer: Self-pay | Admitting: Pharmacist Clinician (PhC)/ Clinical Pharmacy Specialist

## 2016-02-22 ENCOUNTER — Ambulatory Visit (HOSPITAL_COMMUNITY): Payer: Medicare Other | Admitting: Physical Therapy

## 2016-02-29 ENCOUNTER — Ambulatory Visit (HOSPITAL_COMMUNITY): Payer: Medicare Other | Admitting: Physical Therapy

## 2016-03-07 ENCOUNTER — Ambulatory Visit (HOSPITAL_COMMUNITY): Payer: Medicare Other | Admitting: Physical Therapy

## 2016-03-08 NOTE — Discharge Summary (Signed)
Death Summary  Candace [name] [id number] DOB: May 04, 1922 DOA: 02/21/16  PCP: Chevis Pretty, FNP  Admit date: Feb 21, 2016 Date of Death: 02/21/16 Time of Death: [id number] Notification: [name] Done, FNP notified of death  History of present illness:  Candace [name] is a 83 y.o. female with a history of aortic stenosis, atrial fibrillation on AC, hypertension, and PE presented to her PCP office with 2-3 days of cough, congestion and diarrhea.  CXR was done and was negative for infiltrate, but pt was started on doxycyline for nonspecific respiratory illness.  After going home, the patient's symptoms worsened.  She became more sob and subsequently became unresponsive.  Initially, the family did not want to bring the patient to the hospital as she was DNR.  However, the patient continued to struggle breathing.  Therefore, EMS was activated and pt was brought to the hospital with the intent of comfort care for a dignified death.  Family confirmed that they did want any further testing or aggressive measures.  The patient was placed on oxygen and started on a morphine drip and SL atropine for her secretions.  She did not have air hunger, distress or pain.    Final Diagnoses:  1.   Influenza like illness -pt recently exposed granddaughter who was diagnosed with flu 3 days prior to this admission -family did not want any further diagnostic testing  2.  Acute Respiratory failure with hypoxia -secondary to influenza like illness  3.  Atrial fibrillation -on anticoagulation -no longer active issue as pt has been transitioned to focus on comfort care  4. CKD stage 3 -baseline creatinine 1.3-1.5  5.  Severe aortic stenosis -no longer active issue as pt has been transitioned to focus on comfort care  6. Essential HTN -no longer active issue as pt has been transitioned to focus on comfort care  7.  Goals of care -family confirms they do not want any further testing and to focus  tx on the patient's comfort only   The results of significant diagnostics from this hospitalization (including imaging, microbiology, ancillary and laboratory) are listed below for reference.    Significant Diagnostic Studies: Dg Chest 1 View  Result Date: 02/10/2016 CLINICAL DATA:  Cough, congestion EXAM: CHEST 1 VIEW COMPARISON:  11/03/2015 FINDINGS: Cardiomegaly. There is hyperinflation of the lungs compatible with COPD. No confluent airspace opacities or effusions. No acute bony abnormality. IMPRESSION: Cardiomegaly, COPD.  No active disease. Electronically Signed   By: Rolm Baptise M.D.   On: 02/10/2016 16:51    Microbiology: No results found for this or any previous visit (from the past 240 hour(s)).   Labs: Basic Metabolic Panel: No results for input(s): NA, K, CL, CO2, GLUCOSE, BUN, CREATININE, CALCIUM, MG, PHOS in the last 168 hours. Liver Function Tests: No results for input(s): AST, ALT, ALKPHOS, BILITOT, PROT, ALBUMIN in the last 168 hours. No results for input(s): LIPASE, AMYLASE in the last 168 hours. No results for input(s): AMMONIA in the last 168 hours. CBC: No results for input(s): WBC, NEUTROABS, HGB, HCT, MCV, PLT in the last 168 hours. Cardiac Enzymes: No results for input(s): CKTOTAL, CKMB, CKMBINDEX, TROPONINI in the last 168 hours. D-Dimer No results for input(s): DDIMER in the last 72 hours. BNP: Invalid input(s): POCBNP CBG: No results for input(s): GLUCAP in the last 168 hours. Anemia work up No results for input(s): VITAMINB12, FOLATE, FERRITIN, TIBC, IRON, RETICCTPCT in the last 72 hours. Urinalysis    Component Value Date/Time   COLORURINE YELLOW 06/06/2009 Apr 18, 2142  APPEARANCEUR Clear 08/30/2015 1624   LABSPEC 1.015 06/06/2009 2144   PHURINE 5.5 06/06/2009 2144   GLUCOSEU Negative 08/30/2015 1624   HGBUR TRACE (A) 06/06/2009 2144   BILIRUBINUR Negative 08/30/2015 1624   KETONESUR NEGATIVE 06/06/2009 2144   PROTEINUR Trace 08/30/2015 1624    PROTEINUR NEGATIVE 06/06/2009 2144   UROBILINOGEN 0.2 06/06/2009 2144   NITRITE Negative 08/30/2015 1624   NITRITE NEGATIVE 06/06/2009 2144   LEUKOCYTESUR Negative 08/30/2015 1624   Sepsis Labs Invalid input(s): PROCALCITONIN,  WBC,  LACTICIDVEN Microbiology No results found for this or any previous visit (from the past 240 hour(s)).   SignedOrson Eva, DO [facility] [phone] February 14, 2016, 10:27 AM

## 2016-03-08 NOTE — H&P (Signed)
History and Physical    Candace [name] [id number] DOB: 11/15/1922 DOA: 25-Feb-2016  PCP: Chevis Pretty, FNP  Patient coming from: Home.   Chief Complaint:  Patient brought via EMS to the hospital seeking comfort care measures.   HPI: Candace [name] is an 83 y.o. female with hx of AS, afib on anticoagualtion, HLD, presented to her PCP office today with coughs, SOB, and was given doxy along with obtaining a CXR which did not show any PNA.  She went home and deteriorate rather quickly.  Family did not want her to come to the ER originally, but she was having significant SOB, and was brought to the ER.  She was SOB and was unresponsive.  Family did not want any further testing, and wishes only to keep her comfortable, as she is actively dying.  After IV morphine given, hospitalist was asked to admit her for comfort care. Her HR has been in the [id number] BP [id number] systolic.   ED Course:  See above.  Rewiew of Systems: Unable.   Past Medical History:  Diagnosis Date  . Aortic stenosis   . Atrial fibrillation ([place])   . DVT (deep venous thrombosis) ([place])   . Essential hypertension   . Glaucoma   . History of skin cancer 2016  . Hypercholesterolemia   . PE (pulmonary embolism)     Past Surgical History:  Procedure Laterality Date  . CATARACT EXTRACTION    . CHOLECYSTECTOMY    . KYPHOPLASTY    . SKIN CANCER EXCISION Left 2016   neck     reports that she has never smoked. Her smokeless tobacco use includes Snuff. She reports that she does not drink alcohol or use drugs.  Allergies  Allergen Reactions  . Codeine Hives and Shortness Of Breath  . Fosamax [Alendronate Sodium] Hives and Nausea And Vomiting  . Penicillins Hives  . Sulfa Antibiotics Itching, Nausea And Vomiting and Rash  . Tramadol Other (See Comments)    Near syncope  . Boniva [Ibandronic Acid]   . Septra [Sulfamethoxazole-Trimethoprim]   . Vioxx [Rofecoxib]     Family History  Problem Relation Age of Onset   . Stroke Father   . Heart attack Mother   . Diabetes Brother   . Heart disease Brother   . Early death Sister      Prior to Admission medications   Medication Sig Start Date End Date Taking? Authorizing Provider  acetaminophen (TYLENOL) 500 MG tablet Take 1 tablet (500 mg total) by mouth every 8 (eight) hours as needed for mild pain. 08/14/15   Samuella Cota, MD  ALPRAZolam Duanne Moron) 0.25 MG tablet Take 1 tablet (0.25 mg total) by mouth as needed for anxiety. 02/01/16   Wardell Honour, MD  amLODipine (NORVASC) 2.5 MG tablet Take 1 tablet (2.5 mg total) by mouth daily. 09/28/15   Minus Breeding, MD  brimonidine (ALPHAGAN P) 0.1 % SOLN Apply 1 drop to eye 2 (two) times daily.     Historical Provider, MD  denosumab (PROLIA) 60 MG/ML SOLN injection INJECT 60 MG INTO THE SKIN EVERY 6 MONTHS. BRING TO OFFICE FOR ADMINSTRATION 09/12/15   [name] Done, FNP  doxycycline (VIBRA-TABS) 100 MG tablet Take 1 tablet (100 mg total) by mouth 2 (two) times daily. 02/10/16   Wardell Honour, MD  furosemide (LASIX) 20 MG tablet TAKE ONE TABLET BY MOUTH ONCE DAILY 12/12/15   [name] Done, FNP  HYDROcodone-acetaminophen (NORCO/VICODIN) 5-325 MG tablet Take 1 tablet by mouth every 4 (  four) hours as needed. 01/10/16   [name] Done, FNP  LUMIGAN 0.01 % SOLN INSTILL ONE DROP INTO [facility] EYE AT BEDTIME 10/04/15   [name] Done, FNP  nitroGLYCERIN (NITROSTAT) 0.4 MG SL tablet Place 1 tablet (0.4 mg total) under the tongue every 5 (five) minutes as needed for chest pain. 08/18/15   Minus Breeding, MD  simvastatin (ZOCOR) 40 MG tablet Take 1 tablet (40 mg total) by mouth at bedtime. 06/10/15   [name] Done, FNP  warfarin (COUMADIN) 2 MG tablet TAKE ONE OR TWO TABLETS BY MOUTH ONCE DAILY AS DIRECTED BY ANTICOAGULATION Patient taking differently: TAKE ONE TABLET BY MOUTH ONCE DAILY AS DIRECTED BY ANTICOAGULATION 08/24/15   [name] Done, FNP    Physical Exam: Vitals:   03/02/16 0100  2016/03/02 0115 Mar 02, 2016 0130 02-Mar-2016 0200  Pulse: (!) 42 (!) 43 (!) 42 (!) 41  Resp: 26 21 23 23   SpO2: 92% 92% 92% 91%  Weight:      Height:          Constitutional: NAD, calm, comfortable Vitals:   2016/03/02 0100 03-02-16 0115 2016-03-02 0130 2016-03-02 0200  Pulse: (!) 42 (!) 43 (!) 42 (!) 41  Resp: 26 21 23 23   SpO2: 92% 92% 92% 91%  Weight:      Height:       Eyes: PERRL, lids and conjunctivae normal ENMT: Mucous membranes are moist. Posterior pharynx clear of any exudate or lesions.Normal dentition.  Neck: normal, supple, no masses, no thyromegaly Respiratory: agonal breathing.  Cardiovascular: Regular rate and rhythm, no murmurs / rubs / gallops. No extremity edema. 2+ pedal pulses. No carotid bruits.  Abdomen: no tenderness, no masses palpated. No hepatosplenomegaly. Bowel sounds positive.  Musculoskeletal: no clubbing / cyanosis. No joint deformity upper and lower extremities. Good ROM, no contractures. Normal muscle tone.  Skin: no rashes, lesions, ulcers. No induration Neurologic: CN 2-12 grossly intact. Sensation intact, DTR normal. Strength 5/5 in all 4.  Psychiatric: Unresponsive.   Radiological Exams on Admission: Dg Chest 1 View  Result Date: 02/10/2016 CLINICAL DATA:  Cough, congestion EXAM: CHEST 1 VIEW COMPARISON:  11/03/2015 FINDINGS: Cardiomegaly. There is hyperinflation of the lungs compatible with COPD. No confluent airspace opacities or effusions. No acute bony abnormality. IMPRESSION: Cardiomegaly, COPD.  No active disease. Electronically Signed   By: Rolm Baptise M.D.   On: 02/10/2016 16:51   Assessment/Plan Active Problems:   Respiratory failure (HCC)    PLAN:   Will titrate her Morphine starting at 2mg  up to 10mg  per hour for comfort care only.  Give oxygen and atrophine SL for handling of secretions.  Primary and extended family at bedside.  Family understand that she is actively dying.  They contacted family's pastor. Her death is imminent.     DVT  prophylaxis: NONE>  Code Status: Comfort care only.  Family Communication: Primary and extended  Family.  Disposition Plan: Inpatient comfort care.  Consults called: None.  Admission status: Inpatient.    Deazia Lampi MD FACP. [facility]  If 7PM-7AM, please contact night-coverage [email or web address] Password [email or web address]  2016-03-02, 2:48 AM

## 2016-03-08 NOTE — ED Triage Notes (Signed)
Pt's family wanted pt brought to the hospital to die and not at home. Pt is a dnr.

## 2016-03-08 NOTE — ED Notes (Signed)
Attempted blood pressure again manually and was unable to obtain.

## 2016-03-08 NOTE — Progress Notes (Signed)
PROGRESS NOTE  Candace [name] [id number] DOB: 1922-03-04 DOA: 02/28/2016 PCP: Chevis Pretty, FNP  Brief History:  Candace [name] is a 83 y.o. female with a history of aortic stenosis, atrial fibrillation on AC, hypertension, and PE presented to her PCP office with 2-3 days of cough, congestion and diarrhea.  CXR was done and was negative for infiltrate, but pt was started on doxycyline for nonspecific respiratory illness.  After going home, the patient's symptoms worsened.  She became more sob and subsequently became unresponsive.  Initially, the family did not want to bring the patient to the hospital as she was DNR.  However, the patient continued to struggle breathing.  Therefore, EMS was activated and pt was brought to the hospital with the intent of comfort care for a dignified death.  Family confirmed that they did want any further testing or aggressive measures.  The patient was placed on oxygen and started on a morphine drip and SL atropine for her secretions.  She did not have air hunger, distress or pain.   Assessment/Plan: 1.   Influenza like illness -pt recently exposed granddaughter who was diagnosed with flu 3 days prior to this admission -family did not want any further diagnostic testing  2.  Acute Respiratory failure with hypoxia -secondary to influenza like illness  3.  Atrial fibrillation -on anticoagulation -no longer active issue as pt has been transitioned to focus on comfort care  4. CKD stage 3 -baseline creatinine 1.3-1.5  5.  Severe aortic stenosis -no longer active issue as pt has been transitioned to focus on comfort care  6. Essential HTN -no longer active issue as pt has been transitioned to focus on comfort care  7.  Goals of care -family confirms they do not want any further testing and to focus tx on the patient's comfort only    Disposition Plan:   Expect in-hospital death in next 24 hours Family Communication:    Family at bedside updated  Consultants:  none  Code Status:  FULL COMFORT  DVT Prophylaxis:  Comfort care   Procedures: As Listed in Progress Note Above  Antibiotics: None    Subjective: Patient resting comfortably. No respiratory distress, uncontrolled pain, vomiting, diarrhea.  Objective: Vitals:   2016-02-28 0245 28-Feb-2016 0300 Feb 28, 2016 0303 2016-02-28 0843  BP:    (!) 71/29  Pulse: (!) 35 (!) 25  (!) 38  Resp: 21 23 21 10   SpO2: 94% 92% 96% 97%  Weight:      Height:        Intake/Output Summary (Last 24 hours) at 02/28/16 1532 Last data filed at 02/28/16 0600  Gross per 24 hour  Intake             7.88 ml  Output                0 ml  Net             7.88 ml   Weight change:  Exam:   General:  Pt is  not in acute distress  HEENT: No icterus,  [employer]/AT  Cardiovascular: RRR, S1/S2, no rubs, no gallops  Respiratory: Unlabored, no wheezing. Diminished breath sounds.  Abdomen: Soft/+BS, ND  Extremities: No edema, No lymphangitis\   Data Reviewed: I have personally reviewed following labs and imaging studies Basic Metabolic Panel: No results for input(s): NA, K, CL, CO2, GLUCOSE, BUN, CREATININE, CALCIUM, MG, PHOS in the last 168 hours. Liver  Function Tests: No results for input(s): AST, ALT, ALKPHOS, BILITOT, PROT, ALBUMIN in the last 168 hours. No results for input(s): LIPASE, AMYLASE in the last 168 hours. No results for input(s): AMMONIA in the last 168 hours. Coagulation Profile:  Recent Labs Lab 02/08/16 1551  INR 1.6*   CBC: No results for input(s): WBC, NEUTROABS, HGB, HCT, MCV, PLT in the last 168 hours. Cardiac Enzymes: No results for input(s): CKTOTAL, CKMB, CKMBINDEX, TROPONINI in the last 168 hours. BNP: Invalid input(s): POCBNP CBG: No results for input(s): GLUCAP in the last 168 hours. HbA1C: No results for input(s): HGBA1C in the last 72 hours. Urine analysis:    Component Value Date/Time   COLORURINE YELLOW 06/06/2009 2144    APPEARANCEUR Clear 08/30/2015 1624   LABSPEC 1.015 06/06/2009 2144   PHURINE 5.5 06/06/2009 2144   GLUCOSEU Negative 08/30/2015 1624   HGBUR TRACE (A) 06/06/2009 2144   BILIRUBINUR Negative 08/30/2015 1624   KETONESUR NEGATIVE 06/06/2009 2144   PROTEINUR Trace 08/30/2015 1624   PROTEINUR NEGATIVE 06/06/2009 2144   UROBILINOGEN 0.2 06/06/2009 2144   NITRITE Negative 08/30/2015 1624   NITRITE NEGATIVE 06/06/2009 2144   LEUKOCYTESUR Negative 08/30/2015 1624   Sepsis Labs: @LABRCNTIP (procalcitonin:4,lacticidven:4) )No results found for this or any previous visit (from the past 240 hour(s)).   Scheduled Meds: . atropine  1 drop Sublingual TID  . sodium chloride flush  3 mL Intravenous Q12H   Continuous Infusions: . morphine 3 mg/hr (2016-02-24 0515)    Procedures/Studies: Dg Chest 1 View  Result Date: 02/10/2016 CLINICAL DATA:  Cough, congestion EXAM: CHEST 1 VIEW COMPARISON:  11/03/2015 FINDINGS: Cardiomegaly. There is hyperinflation of the lungs compatible with COPD. No confluent airspace opacities or effusions. No acute bony abnormality. IMPRESSION: Cardiomegaly, COPD.  No active disease. Electronically Signed   By: Rolm Baptise M.D.   On: 02/10/2016 16:51    Baleria Wyman, DO  [facility] Pager [phone]  If 7PM-7AM, please contact night-coverage [email or web address] Password [email or web address] 24-Feb-2016, 3:32 PM   LOS: 0 days

## 2016-03-08 NOTE — ED Provider Notes (Signed)
[facility] DEPT Provider Note   CSN: [id number] Arrival date & time: Mar 01, 2016  0030  By signing my name below, I, Gwenlyn Fudge, attest that this documentation has been prepared under the direction and in the presence of Orpah Greek, MD. Electronically Signed: Gwenlyn Fudge, ED Scribe. March 01, 2016. 12:42 AM.   History   Chief Complaint Chief Complaint  Patient presents with  . Other    Comfort Care   The history is provided by a relative. No language interpreter was used.    LEVEL 5 CAVEAT DUE TO ACUITY OF CONDITION  HPI Comments: Candace [name] is a 83 y.o. female who presents to the Emergency Department for comfort care. Per triage note, pt's family wanted her to be brought to the hospital to die instead of at home. Pt has a DNR.  Per daughter, pt has been sick for a few days and was seen by PCP today. She was diagnosed with Pneumonia and sent home. Pt was talking, but was unclear and had accessory muscle usage with breathing. Pt was sleeping by 7 PM.  Past Medical History:  Diagnosis Date  . Aortic stenosis   . Atrial fibrillation ([place])   . DVT (deep venous thrombosis) ([place])   . Essential hypertension   . Glaucoma   . History of skin cancer 2016  . Hypercholesterolemia   . PE (pulmonary embolism)     Patient Active Problem List   Diagnosis Date Noted  . Pain of left hand 11/08/2015  . Hand strain, left, initial encounter 11/08/2015  . Cellulitis 10/25/2015  . Cellulitis of right foot 10/24/2015  . Hypokalemia 10/24/2015  . Bradycardia 10/24/2015  . Cellulitis of foot, right 10/24/2015  . Wound infection   . Aortic atherosclerosis ([place]) 09/01/2015  . Sick sinus syndrome ([place]) 08/11/2015  . Non-sustained ventricular tachycardia ([place]) 08/11/2015  . Anticoagulated on Coumadin 08/11/2015  . CKD (chronic kidney disease) stage 3, GFR 30-59 ml/min 08/09/2015  . Elevated troponin 08/08/2015  . Renal insufficiency 08/08/2015  . Severe aortic stenosis  08/05/2015  . Hyperlipidemia 06/10/2015  . Tobacco use disorder 01/28/2014  . Osteoporosis with pathological fracture 08/13/2012  . DVT (deep venous thrombosis), right 05/13/2012  . HTN (hypertension) 05/13/2012  . Long term current use of anticoagulant therapy 05/13/2012  . Atrial fibrillation ([place]) 04/15/2012    Past Surgical History:  Procedure Laterality Date  . CATARACT EXTRACTION    . CHOLECYSTECTOMY    . KYPHOPLASTY    . SKIN CANCER EXCISION Left 2016   neck    OB History    Gravida Para Term Preterm AB Living             4   SAB TAB Ectopic Multiple Live Births                   Home Medications    Prior to Admission medications   Medication Sig Start Date End Date Taking? Authorizing Provider  acetaminophen (TYLENOL) 500 MG tablet Take 1 tablet (500 mg total) by mouth every 8 (eight) hours as needed for mild pain. 08/14/15   Samuella Cota, MD  ALPRAZolam Duanne Moron) 0.25 MG tablet Take 1 tablet (0.25 mg total) by mouth as needed for anxiety. 02/01/16   Wardell Honour, MD  amLODipine (NORVASC) 2.5 MG tablet Take 1 tablet (2.5 mg total) by mouth daily. 09/28/15   Minus Breeding, MD  brimonidine (ALPHAGAN P) 0.1 % SOLN Apply 1 drop to eye 2 (two) times daily.  Historical Provider, MD  denosumab (PROLIA) 60 MG/ML SOLN injection INJECT 60 MG INTO THE SKIN EVERY 6 MONTHS. BRING TO OFFICE FOR ADMINSTRATION 09/12/15   [name] Done, FNP  doxycycline (VIBRA-TABS) 100 MG tablet Take 1 tablet (100 mg total) by mouth 2 (two) times daily. 02/10/16   Wardell Honour, MD  furosemide (LASIX) 20 MG tablet TAKE ONE TABLET BY MOUTH ONCE DAILY 12/12/15   [name] Done, FNP  HYDROcodone-acetaminophen (NORCO/VICODIN) 5-325 MG tablet Take 1 tablet by mouth every 4 (four) hours as needed. 01/10/16   [name] Done, FNP  LUMIGAN 0.01 % SOLN INSTILL ONE DROP INTO [facility] EYE AT BEDTIME 10/04/15   [name] Done, FNP  nitroGLYCERIN (NITROSTAT) 0.4 MG SL tablet Place 1 tablet  (0.4 mg total) under the tongue every 5 (five) minutes as needed for chest pain. 08/18/15   Minus Breeding, MD  simvastatin (ZOCOR) 40 MG tablet Take 1 tablet (40 mg total) by mouth at bedtime. 06/10/15   [name] Done, FNP  warfarin (COUMADIN) 2 MG tablet TAKE ONE OR TWO TABLETS BY MOUTH ONCE DAILY AS DIRECTED BY ANTICOAGULATION Patient taking differently: TAKE ONE TABLET BY MOUTH ONCE DAILY AS DIRECTED BY ANTICOAGULATION 08/24/15   [name] Done, FNP    Family History Family History  Problem Relation Age of Onset  . Stroke Father   . Heart attack Mother   . Diabetes Brother   . Heart disease Brother   . Early death Sister     Social History Social History  Substance Use Topics  . Smoking status: Never Smoker  . Smokeless tobacco: Current User    Types: Snuff  . Alcohol use No     Allergies   Codeine; Fosamax [alendronate sodium]; Penicillins; Sulfa antibiotics; Tramadol; Boniva [ibandronic acid]; Septra [sulfamethoxazole-trimethoprim]; and Vioxx [rofecoxib]   Review of Systems Review of Systems  Unable to perform ROS: Patient unresponsive   Physical Exam Updated Vital Signs Pulse (!) 43   Resp 21   Ht 4\' 3"  (1.295 m)   Wt 87 lb (39.5 kg)   SpO2 92%   BMI 23.52 kg/m   Physical Exam  Constitutional: She appears distressed.  HENT:  Head: Normocephalic and atraumatic.  Eyes:  Pupils are not reactive   Cardiovascular: Bradycardia present.   Pulmonary/Chest: Accessory muscle usage present. Tachypnea noted. She is in respiratory distress. She has rales.  Musculoskeletal: She exhibits edema.  Neurological:  Unresponsive  Skin: Skin is dry.    ED Treatments / Results  DIAGNOSTIC STUDIES: Oxygen Saturation is 81% on NCO2, low by my interpretation.      Labs (all labs ordered are listed, but only abnormal results are displayed) Labs Reviewed - No data to display  EKG  EKG Interpretation None       Radiology Dg Chest 1 View  Result Date:  02/10/2016 CLINICAL DATA:  Cough, congestion EXAM: CHEST 1 VIEW COMPARISON:  11/03/2015 FINDINGS: Cardiomegaly. There is hyperinflation of the lungs compatible with COPD. No confluent airspace opacities or effusions. No acute bony abnormality. IMPRESSION: Cardiomegaly, COPD.  No active disease. Electronically Signed   By: Rolm Baptise M.D.   On: 02/10/2016 16:51    Procedures Procedures (including critical care time)  Medications Ordered in ED Medications  morphine 250 mg in sodium chloride 0.9 % 250 mL (1 mg/mL) infusion (not administered)  morphine 2 MG/ML injection 2 mg (2 mg Intravenous Given 02/24/16 0109)     Initial Impression / Assessment and Plan / ED Course  I have reviewed the triage  vital signs and the nursing notes.  Pertinent labs & imaging results that were available during my care of the patient were reviewed by me and considered in my medical decision making (see chart for details).      Patient brought to the emergency department from home by ambulance. Patient has been sick for the last several days. She was seen by her primary care provider earlier today and diagnosed with pneumonia. Since the visit she has progressively worsened. Family reports that she was weak and did not eat or drink much this afternoon. This evening she was then noted to start having difficulty breathing and became unresponsive.  Evaluated her at home and initially was not going to transport her because family did not want to have any interventions. It was felt that her death was eminent, but she continued to struggle and therefore was transported to the hospital with the family requesting "comfort measures".  At arrival to the ER, all 3 children are present. All 3 are in agreement that they do not wish to have any invasive procedures performed. Patient is confirmed as a DO NOT RESUSCITATE. Family requests that she simply "be kept comfortable". Patient completely unresponsive at this time. She has  spontaneous respirations. Patient administered 2 mg of IV morphine, continues to have increased work of breathing. Will initiate IV drip and have patient admitted for comfort measures.  I personally performed the services described in this documentation, which was scribed in my presence. The recorded information has been reviewed and is accurate.   Final Clinical Impressions(s) / ED Diagnoses   Final diagnoses:  Acute respiratory failure, unspecified whether with hypoxia or hypercapnia [facility])    New Prescriptions New Prescriptions   No medications on file     Orpah Greek, MD 2016-02-13 0126

## 2016-03-08 NOTE — Progress Notes (Signed)
Pt pronounced deceased at 2307. Family at bedside. Verified by 2 RNs. Morphine drip discontinued at 2310. Oxygen discontinued at 2310. [facility] contacted. Dr. Orvan Falconer paged. Family requests PT to be picked up my [facility] in [place], [place].

## 2016-03-08 NOTE — ED Notes (Signed)
Could not obtain blood pressure, edp made aware. Tried automatic cuff and manuel. EMS states her last pressure was 52/38.

## 2016-03-08 DEATH — deceased

## 2016-06-20 NOTE — Telephone Encounter (Signed)
done

## 2017-04-15 IMAGING — DX DG LUMBAR SPINE 2-3V
2 series · 2 of 2 positions shown · non-contrast
Comparison: Lumbar spine series of November 10, 2011

CLINICAL DATA: Back pain, history of osteoporosis and previous
lower thoracic kyphoplasty.

EXAM:
LUMBAR SPINE - 2-3 VIEW

[l-spine ap]
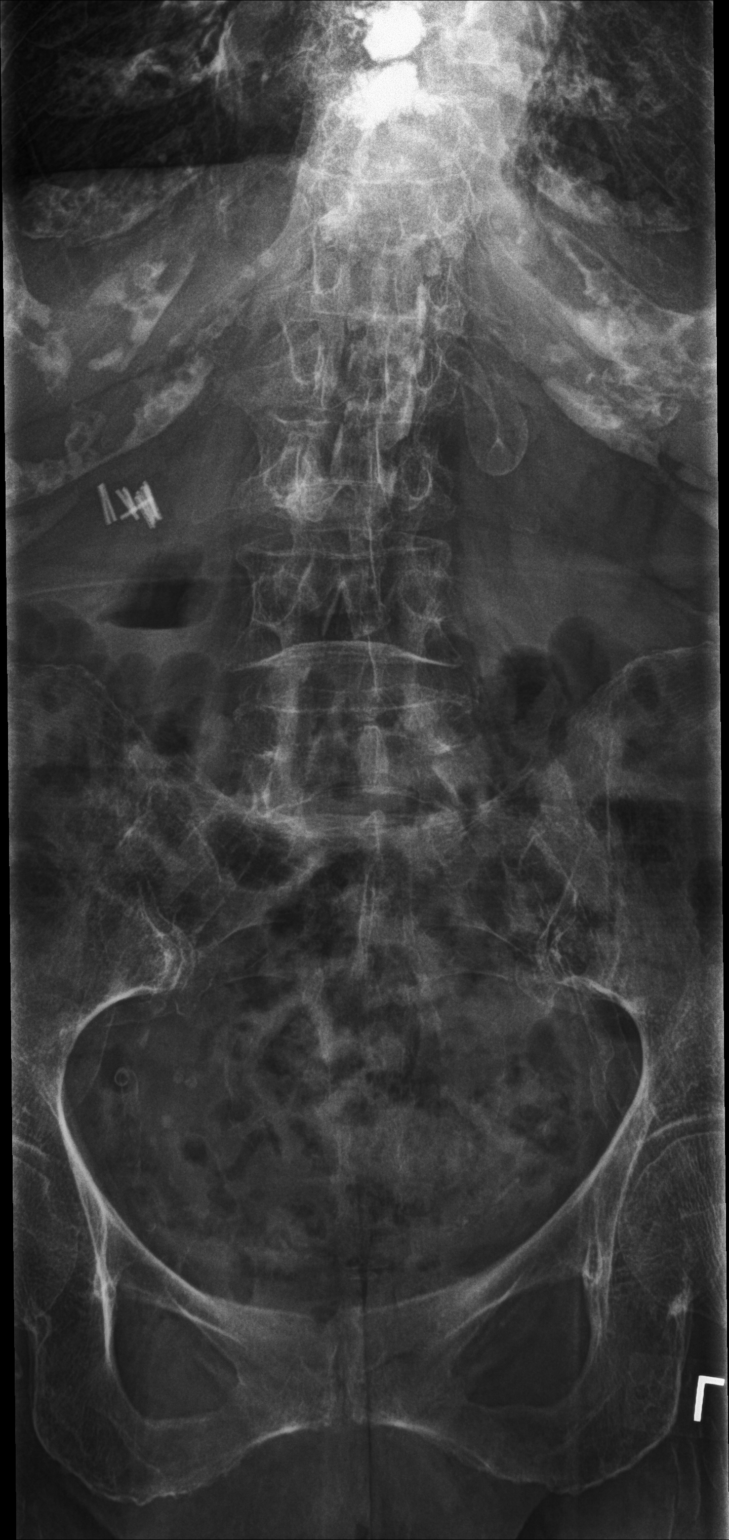

[l-spine lat]
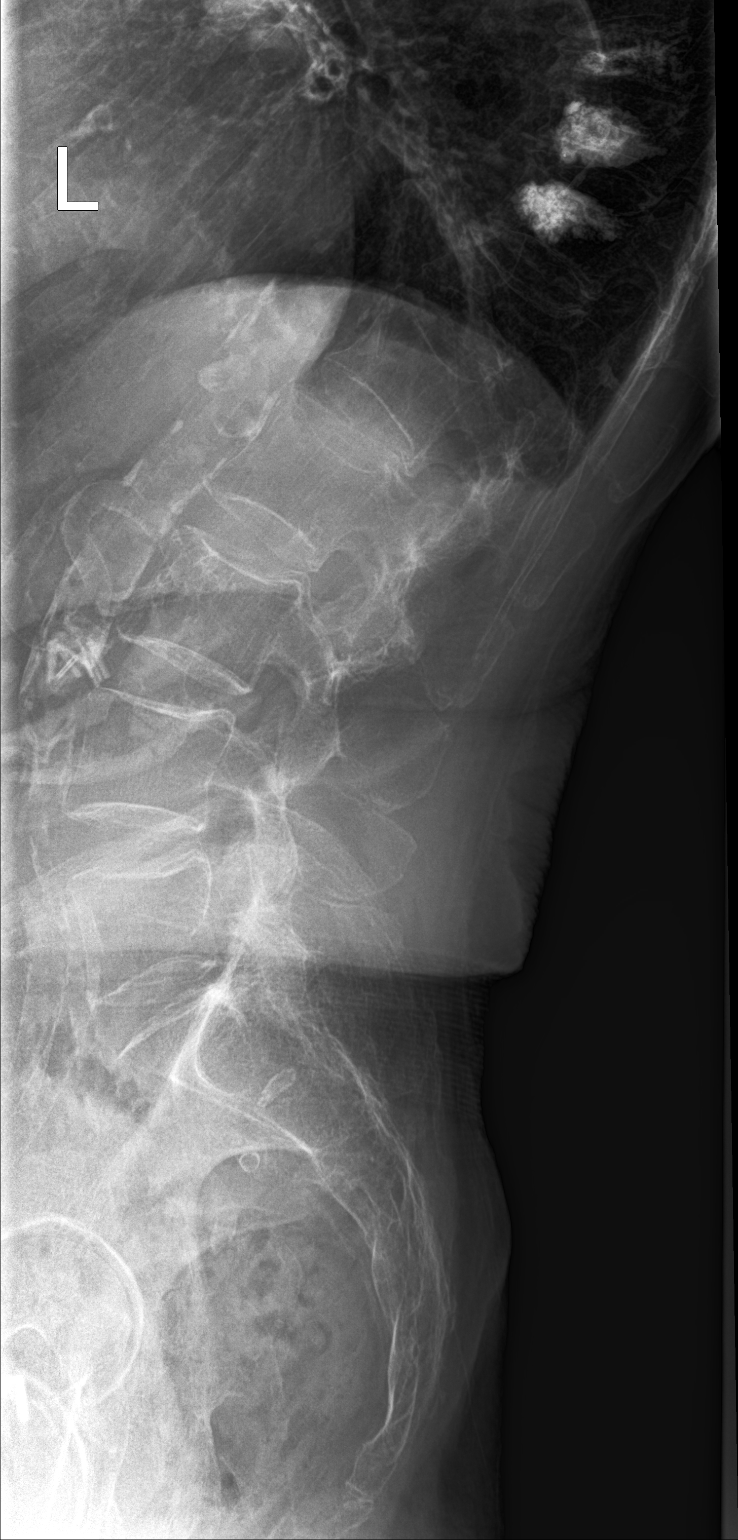

[2 of 2 positions shown; findings below may reference images not displayed]

FINDINGS: The bones are severely osteopenic. The lumbar vertebral bodies are
preserved in height. The disc space heights are reasonably
well-maintained. There is partial compression of the body of T12
which is stable. The patient has undergone previous kyphoplasty of
T9, T10, and T11. There is no lumbar spondylolisthesis. There is
mild facet joint hypertrophy at L4-5 and L5-S1.

There is dense calcification in the wall of the abdominal aorta and
iliac arteries. There is splenic artery calcification. There are
surgical clips in the gallbladder fossa. The bowel gas pattern is
unremarkable where visualized.
IMPRESSION: Diffuse osteopenia. No lumbar spine compression fracture nor other
acute bony abnormality. Mild degenerative facet joint hypertrophy at
L4-5 and L5-S1.

Previous kyphoplasty in the lower thoracic spine. Stable partial
compression of T12. New

Aortoiliac atherosclerosis.

## 2017-08-09 IMAGING — DX DG THORACIC SPINE 2V
3 series · 3 of 3 positions shown · non-contrast
Comparison: 08/13/2015

CLINICAL DATA: Back pain.  No known injury.

EXAM:
THORACIC SPINE 2 VIEWS

[t-spine ap (1 of 2)]
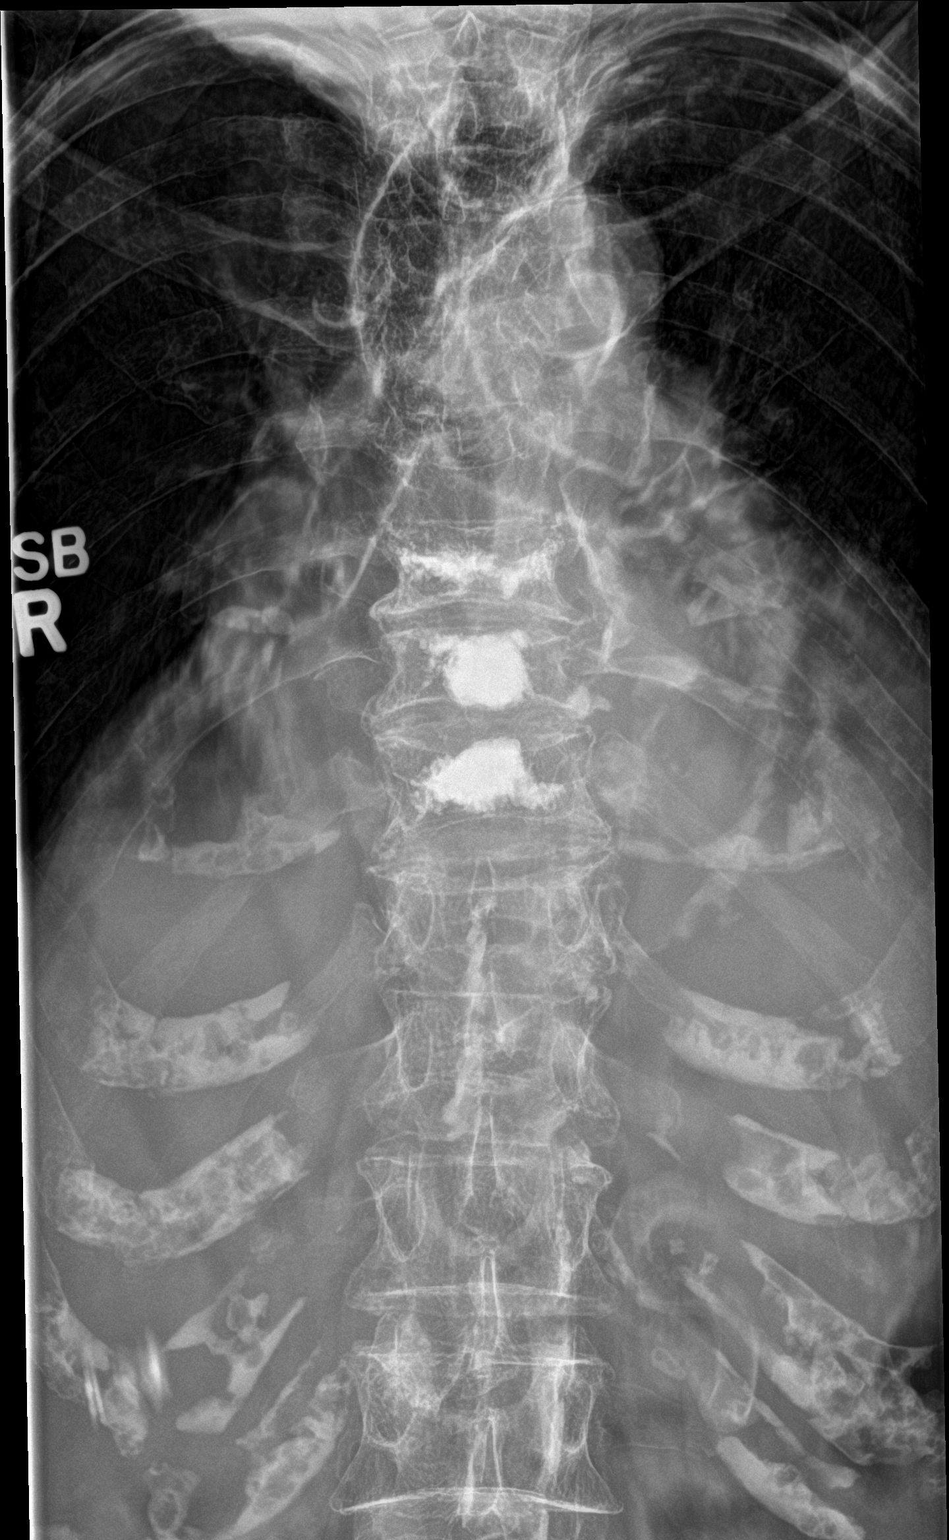

[t-spine lat]
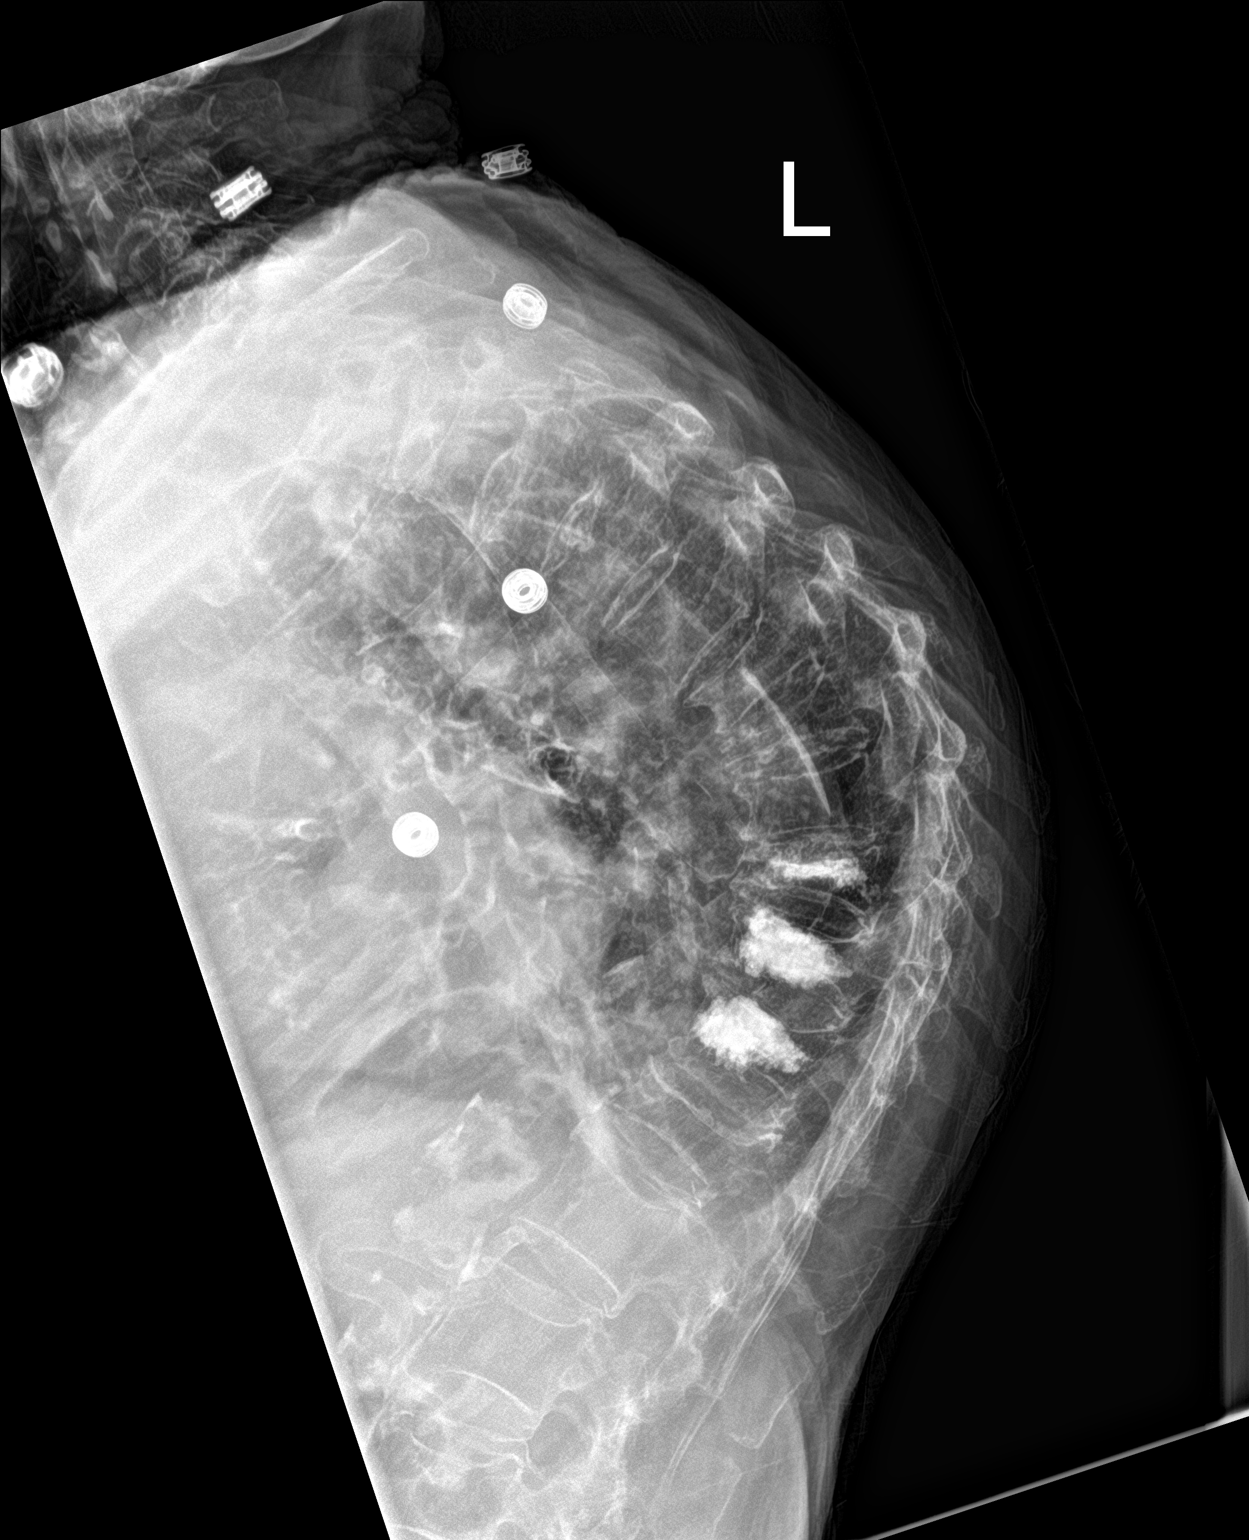

[t-spine ap (2 of 2)]
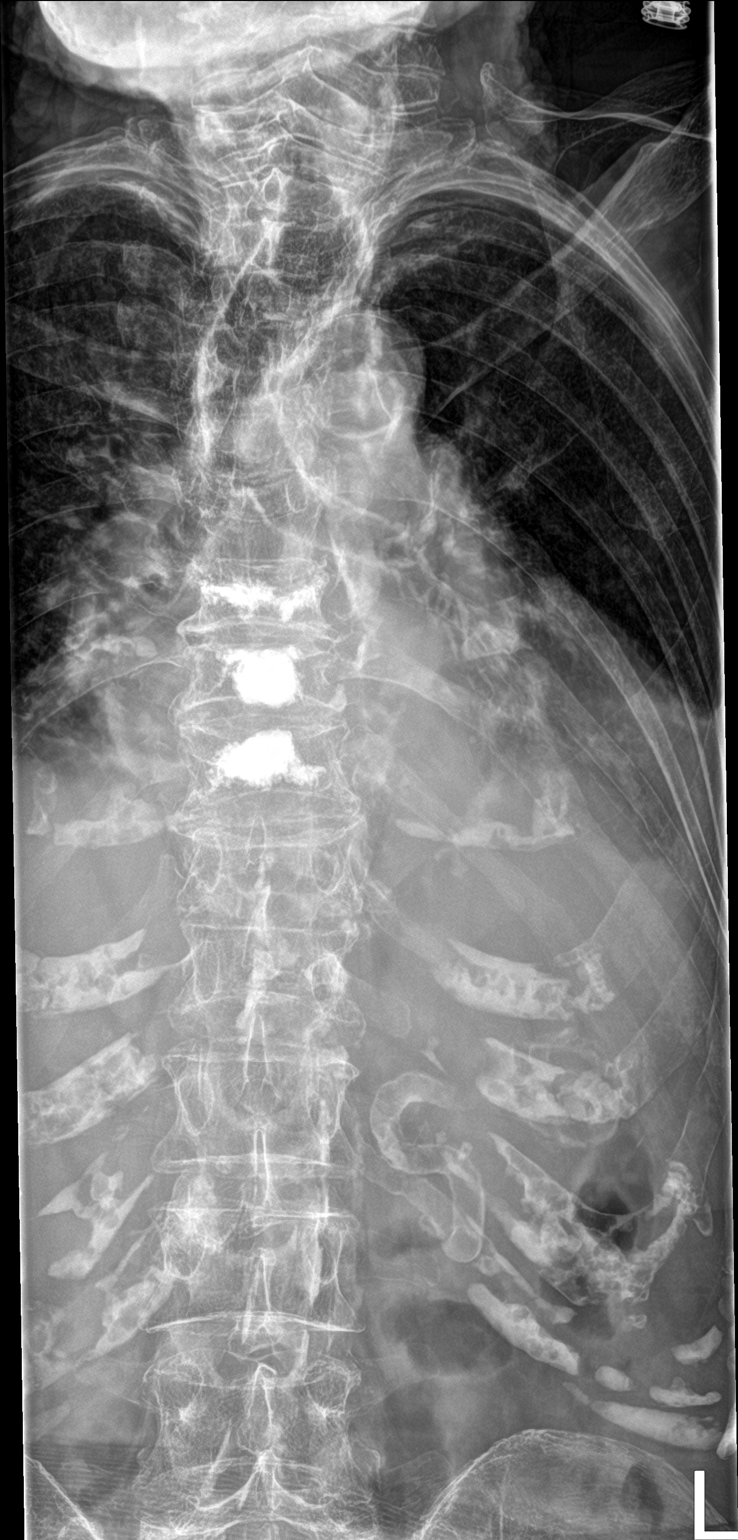

[3 of 3 positions shown; findings below may reference images not displayed]

FINDINGS: Changes of vertebral augmentation from T9-T11. Severe compression
fracture at T7 with moderate compression fracture at T6. This is
stable since prior study. Severe stable compression fracture T12.
Severe kyphosis.
IMPRESSION: Numerous moderate severe compression fractures in the mid and lower
thoracic spine with 3 level vertebral augmentation changes. No
change since prior study.
